# Patient Record
Sex: Female | Born: 1990 | Race: Black or African American | Hispanic: No | Marital: Single | Smoking: Former smoker
Health system: Southern US, Community
[De-identification: ages and names within clinical notes are randomized; demographics above are authoritative.]

## PROBLEM LIST (undated history)

## (undated) DIAGNOSIS — L309 Dermatitis, unspecified: Secondary | ICD-10-CM

## (undated) DIAGNOSIS — J45909 Unspecified asthma, uncomplicated: Secondary | ICD-10-CM

## (undated) DIAGNOSIS — E119 Type 2 diabetes mellitus without complications: Secondary | ICD-10-CM

## (undated) DIAGNOSIS — I10 Essential (primary) hypertension: Secondary | ICD-10-CM

## (undated) HISTORY — PX: CATARACT EXTRACTION, BILATERAL: SHX1313

---

## 2016-01-04 LAB — HM PAP SMEAR: HM Pap smear: NEGATIVE

## 2016-11-09 ENCOUNTER — Encounter: Payer: Self-pay | Admitting: Emergency Medicine

## 2016-11-09 ENCOUNTER — Emergency Department
Admission: EM | Admit: 2016-11-09 | Discharge: 2016-11-09 | Disposition: A | Discharge status: Home / Self Care | Payer: Self-pay | Attending: Emergency Medicine | Admitting: Emergency Medicine

## 2016-11-09 ENCOUNTER — Emergency Department: Payer: Self-pay

## 2016-11-09 DIAGNOSIS — J45909 Unspecified asthma, uncomplicated: Secondary | ICD-10-CM | POA: Insufficient documentation

## 2016-11-09 DIAGNOSIS — Y9301 Activity, walking, marching and hiking: Secondary | ICD-10-CM | POA: Insufficient documentation

## 2016-11-09 DIAGNOSIS — Y999 Unspecified external cause status: Secondary | ICD-10-CM | POA: Insufficient documentation

## 2016-11-09 DIAGNOSIS — S93402A Sprain of unspecified ligament of left ankle, initial encounter: Secondary | ICD-10-CM | POA: Insufficient documentation

## 2016-11-09 DIAGNOSIS — Y929 Unspecified place or not applicable: Secondary | ICD-10-CM | POA: Insufficient documentation

## 2016-11-09 DIAGNOSIS — F17200 Nicotine dependence, unspecified, uncomplicated: Secondary | ICD-10-CM | POA: Insufficient documentation

## 2016-11-09 DIAGNOSIS — W109XXA Fall (on) (from) unspecified stairs and steps, initial encounter: Secondary | ICD-10-CM | POA: Insufficient documentation

## 2016-11-09 HISTORY — DX: Unspecified asthma, uncomplicated: J45.909

## 2016-11-09 MED ORDER — KETOROLAC TROMETHAMINE 60 MG/2ML IM SOLN
60.0000 mg | Freq: Once | INTRAMUSCULAR | Status: AC
Start: 2016-11-09 — End: 2016-11-09
  Administered 2016-11-09: 60 mg via INTRAMUSCULAR
  Filled 2016-11-09: qty 2

## 2016-11-09 MED ORDER — TRAMADOL HCL 50 MG PO TABS: 50.0000 mg | ORAL_TABLET | Freq: Four times a day (QID) | ORAL | 0 refills | Status: DC | PRN

## 2016-11-09 NOTE — ED Provider Notes (Signed)
[facility] Emergency Department Provider Note   ____________________________________________   First MD Initiated Contact with Patient 11/09/16 0542     (approximate)  I have reviewed the triage vital signs and the nursing notes.   HISTORY  Chief Complaint Ankle Pain    HPI Kelsey Mcclure is a 27 y.o. female Who comes into the hospital today with a left ankle injury.the patient reports that she was going down some stairs yesterday and she slipped and fell. This occurred around 10:00. The patient did not hit her head just twisted her ankle during the injury. She reports that when she arrived home she iced her ankle and went to sleep. She got up to use the restroom today and reports that there was worsened pain. She reports that the pain is especially bad when she tries to place weight on her left ankle. She decided to come in and get checked out. The patient did not take any medicine for pain prior to coming in. At rest the patient's pain is a 7 out of 10 in intensity but when she places weight on it and 9 out of 10. She denies any numbness or tingling. She is here today for evaluation.   Past Medical History:  Diagnosis Date  . Asthma     There are no active problems to display for this patient.   History reviewed. No pertinent surgical history.  Prior to Admission medications   Medication Sig Start Date End Date Taking? Authorizing Provider  traMADol (ULTRAM) 50 MG tablet Take 1 tablet (50 mg total) by mouth every 6 (six) hours as needed. 11/09/16   Rebecka Apley, MD    Allergies Patient has no known allergies.  No family history on file.  Social History Social History  Substance Use Topics  . Smoking status: Current Every Day Smoker  . Smokeless tobacco: Not on file  . Alcohol use Yes    Review of Systems  Constitutional: No fever/chills Eyes: No visual changes. ENT: No sore throat. Cardiovascular: Denies chest  pain. Respiratory: Denies shortness of breath. Gastrointestinal: No abdominal pain.  No nausea, no vomiting.  No diarrhea.  No constipation. Genitourinary: Negative for dysuria. Musculoskeletal: left ankle pain Skin: Negative for rash. Neurological: Negative for headaches, focal weakness or numbness.   ____________________________________________   PHYSICAL EXAM:  VITAL SIGNS: ED Triage Vitals  Enc Vitals Group     BP 11/09/16 0357 138/82     Pulse Rate 11/09/16 0357 91     Resp 11/09/16 0357 18     Temp 11/09/16 0357 98 F (36.7 C)     Temp Source 11/09/16 0357 Oral     SpO2 11/09/16 0357 98 %     Weight 11/09/16 0358 280 lb (127 kg)     Height 11/09/16 0358 5\' 3"  (1.6 m)     Head Circumference --      Peak Flow --      Pain Score 11/09/16 0357 9     Pain Loc --      Pain Edu? --      Excl. in GC? --     Constitutional: Alert and oriented. Well appearing and in mild distress. Eyes: Conjunctivae are normal. PERRL. EOMI. Head: Atraumatic. Nose: No congestion/rhinnorhea. Mouth/Throat: Mucous membranes are moist.  Oropharynx non-erythematous. Cardiovascular: Normal rate, regular rhythm. Grossly normal heart sounds.  Good peripheral circulation. Respiratory: Normal respiratory effort.  No retractions. Lungs CTAB. Gastrointestinal: Soft and nontender. No distention. positive bowel sounds Musculoskeletal: No  lower extremity tenderness nor edema.  Tenderness to palpation of left ankle with some soft tissue swelling laterally. The patient also has some pain with passive range of motion. Neurologic:  Normal speech and language.  Skin:  Skin is warm, dry and intact.  Psychiatric: Mood and affect are normal.   ____________________________________________   LABS (all labs ordered are listed, but only abnormal results are displayed)  Labs Reviewed - No data to  display ____________________________________________  EKG  none ____________________________________________  RADIOLOGY  Dg Ankle Complete Left  Result Date: 11/09/2016 CLINICAL DATA:  Trip and fall injury with left ankle pain. EXAM: LEFT ANKLE COMPLETE - 3+ VIEW COMPARISON:  None. FINDINGS: Soft tissue swelling about the left ankle. No evidence of acute fracture or dislocation. No focal bone lesion or bone destruction. Bone cortex appears intact. 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 IMPRESSION: Soft tissue swelling.  No acute bony abnormalities. Electronically Signed   By: Burman NievesWilliam  Stevens M.D.   On: 11/09/2016 04:45    ____________________________________________   PROCEDURES  Procedure(s) performed: please, see procedure note(s).  .Splint Application Date/Time: 11/09/2016 6:00 AM Performed by: Rebecka ApleyWEBSTER, Bryley Kovacevic P Authorized by: Rebecka ApleyWEBSTER, Lorette Peterkin P   Consent:    Consent obtained:  Verbal Pre-procedure details:    Sensation:  Normal   Skin color:  Manson PasseyBrown Procedure details:    Laterality:  Left   Location:  Ankle   Ankle:  L ankle   Splint type:  Ankle stirrup   Supplies:  Prefabricated splint Post-procedure details:    Pain:  Unchanged   Sensation:  Normal   Skin color:  Brown   Patient tolerance of procedure:  Tolerated well, no immediate complications    Critical Care performed: No  ____________________________________________   INITIAL IMPRESSION / ASSESSMENT AND PLAN / ED COURSE  As part of my medical decision making, I reviewed the following data within the electronic MEDICAL RECORD NUMBER Notes from prior ED visits and [place] Controlled Substance Database   This is a 26 year old female who comes into the hospital today with some left ankle pain after falling down some stairs.  The differential diagnosis includes fibula fracture, tibia fracture, ligamentous injury, ankle sprain, musculoskeletal pain.  We did send the patient for an  x-ray and while she does have some soft tissue swelling she does not have any acute fracture. I did give the patient a shot of Toradol and we will place her in a splint and give her some crutches. The patient is to be made nonweightbearing until she can follow up with orthopedic surgery for further evaluation of her ankle. The patient has no further questions or concerns and she'll be discharged home.      ____________________________________________   FINAL CLINICAL IMPRESSION(S) / ED DIAGNOSES  Final diagnoses:  Sprain of left ankle, unspecified ligament, initial encounter      NEW MEDICATIONS STARTED DURING THIS VISIT:  New Prescriptions   TRAMADOL (ULTRAM) 50 MG TABLET    Take 1 tablet (50 mg total) by mouth every 6 (six) hours as needed.     Note:  This document was prepared using Dragon voice recognition software and may include unintentional dictation errors.    Rebecka ApleyWebster, Micki Cassel P, MD 11/09/16 [id number]

## 2016-11-09 NOTE — ED Triage Notes (Signed)
Pt tripped and fell and is now co left ankle pain, pt is able to ambulate with pain.

## 2016-11-09 NOTE — Discharge Instructions (Signed)
Please follow-up with orthopedics

## 2017-12-08 DIAGNOSIS — IMO0002 Reserved for concepts with insufficient information to code with codable children: Secondary | ICD-10-CM | POA: Insufficient documentation

## 2017-12-08 LAB — HM DIABETES EYE EXAM

## 2018-01-13 ENCOUNTER — Encounter: Payer: Self-pay | Admitting: Emergency Medicine

## 2018-01-13 ENCOUNTER — Other Ambulatory Visit: Payer: Self-pay

## 2018-01-13 ENCOUNTER — Emergency Department
Admission: EM | Admit: 2018-01-13 | Discharge: 2018-01-13 | Disposition: A | Discharge status: Home / Self Care | Payer: BLUE CROSS/BLUE SHIELD | Attending: Emergency Medicine | Admitting: Emergency Medicine

## 2018-01-13 DIAGNOSIS — F17200 Nicotine dependence, unspecified, uncomplicated: Secondary | ICD-10-CM | POA: Diagnosis not present

## 2018-01-13 DIAGNOSIS — H1031 Unspecified acute conjunctivitis, right eye: Secondary | ICD-10-CM | POA: Insufficient documentation

## 2018-01-13 DIAGNOSIS — R062 Wheezing: Secondary | ICD-10-CM | POA: Diagnosis not present

## 2018-01-13 DIAGNOSIS — H11431 Conjunctival hyperemia, right eye: Secondary | ICD-10-CM | POA: Diagnosis present

## 2018-01-13 MED ORDER — EYE WASH OPHTH SOLN
1.0000 [drp] | OPHTHALMIC | Status: DC | PRN
  Administered 2018-01-13: 1 [drp] via OPHTHALMIC
  Filled 2018-01-13: qty 118

## 2018-01-13 MED ORDER — FLUORESCEIN SODIUM 1 MG OP STRP
1.0000 | ORAL_STRIP | Freq: Once | OPHTHALMIC | Status: AC
  Administered 2018-01-13: 1 via OPHTHALMIC
  Filled 2018-01-13: qty 1

## 2018-01-13 MED ORDER — IPRATROPIUM-ALBUTEROL 0.5-2.5 (3) MG/3ML IN SOLN
RESPIRATORY_TRACT | Status: AC
  Filled 2018-01-13: qty 3

## 2018-01-13 MED ORDER — IPRATROPIUM-ALBUTEROL 0.5-2.5 (3) MG/3ML IN SOLN
3.0000 mL | Freq: Once | RESPIRATORY_TRACT | Status: AC
  Administered 2018-01-13: 3 mL via RESPIRATORY_TRACT
  Filled 2018-01-13: qty 3

## 2018-01-13 MED ORDER — MOXIFLOXACIN HCL 0.5 % OP SOLN: 1.0000 [drp] | Freq: Three times a day (TID) | OPHTHALMIC | 0 refills | Status: AC

## 2018-01-13 MED ORDER — TETRACAINE HCL 0.5 % OP SOLN
1.0000 [drp] | Freq: Once | OPHTHALMIC | Status: AC
  Administered 2018-01-13: 1 [drp] via OPHTHALMIC
  Filled 2018-01-13: qty 4

## 2018-01-13 NOTE — ED Triage Notes (Signed)
R eye irritation and blurred vision since yesterday. Sclera noted reddened. States does not know of getting anything in it.

## 2018-01-13 NOTE — ED Provider Notes (Signed)
[facility] Emergency Department Provider Note  ____________________________________________   First MD Initiated Contact with Patient 01/13/18 1651     (approximate)  I have reviewed the triage vital signs and the nursing notes.   HISTORY  Chief Complaint Eye Problem   HPI Kelsey Mcclure is a 27 y.o. female presents today with complaint of  right eye redness.  Patient states that she is not aware of any injury and denies drainage from her eye.  She states that she was not aware that her eye was red until several people at work commented about it.  She states that her vision is "off" due to cataracts.  She is scheduled for surgery on January 6 for her left eye.  Patient denies any pain in her right eye.  She has no symptoms of nausea, vomiting or headache.  She denies any foreign body sensation.   Past Medical History:  Diagnosis Date  . Asthma     There are no active problems to display for this patient.   History reviewed. No pertinent surgical history.  Prior to Admission medications   Medication Sig Start Date End Date Taking? Authorizing Provider  moxifloxacin (VIGAMOX) 0.5 % ophthalmic solution Place 1 drop into the right eye 3 (three) times daily for 7 days. 01/13/18 01/20/18  Tommi RumpsSummers, Rhonda L, PA-C  traMADol (ULTRAM) 50 MG tablet Take 1 tablet (50 mg total) by mouth every 6 (six) hours as needed. 11/09/16   Rebecka ApleyWebster, Allison P, MD    Allergies Patient has no known allergies.  No family history on file.  Social History Social History   Tobacco Use  . Smoking status: Current Every Day Smoker  Substance Use Topics  . Alcohol use: Yes  . Drug use: Not on file    Review of Systems Constitutional: No fever/chills Eyes: Bilateral cataracts.  Right eye edema. ENT: No sore throat. Cardiovascular: Denies chest pain. Respiratory: Denies shortness of breath. Musculoskeletal: Negative for back pain. Skin: Negative for  rash. Neurological: Negative for headaches, focal weakness or numbness. ____________________________________________   PHYSICAL EXAM:  VITAL SIGNS: ED Triage Vitals  Enc Vitals Group     BP 01/13/18 1640 (!) 148/92     Pulse Rate 01/13/18 1640 92     Resp 01/13/18 1640 20     Temp 01/13/18 1640 98 F (36.7 C)     Temp Source 01/13/18 1640 Oral     SpO2 01/13/18 1640 97 %     Weight 01/13/18 1641 295 lb (133.8 kg)     Height 01/13/18 1641 5\' 3"  (1.6 m)     Head Circumference --      Peak Flow --      Pain Score 01/13/18 1641 0     Pain Loc --      Pain Edu? --      Excl. in GC? --    Constitutional: Alert and oriented. Well appearing and in no acute distress. Eyes: Right conjunctive a is injected.  There was yellow thick exudate noted on initial exam that was removed with a Q-tip.  Lid was everted and no foreign body was noted.  PERRL. EOMI. no pain with EOMs.  Fluorescein dye was placed and no corneal abrasion was noted.  Tono-Pen in the left eye was 17/19.  Tono-Pen right eye was 21/19. Head: Atraumatic. Nose: No congestion/rhinnorhea. Neck: No stridor.   Hematological/Lymphatic/Immunilogical: No cervical lymphadenopathy. Cardiovascular: Normal rate, regular rhythm. Grossly normal heart sounds.  Good peripheral circulation. Respiratory: Normal  respiratory effort.  No retractions. Lungs bilateral expiratory wheezing.  Patient is able to talk in complete sentences without any respiratory difficulty.  Has a history of asthma. Gastrointestinal: Soft and nontender. No distention.  Musculoskeletal: Moves upper and lower extremities with any difficulty.  Normal gait was noted. Neurologic:  Normal speech and language. No gross focal neurologic deficits are appreciated. No gait instability. Skin:  Skin is warm, dry and intact. No rash noted. Psychiatric: Mood and affect are normal. Speech and behavior are normal.  ____________________________________________   LABS (all labs  ordered are listed, but only abnormal results are displayed)  Labs Reviewed - No data to display   PROCEDURES  Procedure(s) performed: None  Procedures  Critical Care performed: No  ____________________________________________   INITIAL IMPRESSION / ASSESSMENT AND PLAN / ED COURSE  As part of my medical decision making, I reviewed the following data within the electronic MEDICAL RECORD NUMBER Notes from prior ED visits and [place] Controlled Substance Database  Patient presents to the ED with complaint of right eye redness that was pointed out to her while she was at work.  Patient states that there was no eye injury and she was not aware that her eye looked red.  She denies any photophobia or pain.  There is been no nausea, vomiting or headache.  Patient states that she currently has bilateral cataracts and is scheduled for surgery at [facility] on January 6 for her left eye.  She states her vision is very poor in both eyes even on good days.  [facility] to have a prescription for Vigamox at the pharmacy but patient states that it is not there.  A prescription for Vigamox was given to the patient to begin using in her right eye.  She is given strict handwashing precautions as there was yellow exudate removed from the corner of her eye while in the ED.  Tono-Pen readings were unremarkable and not concerning for acute glaucoma.  She was also given a note to remain out of work tomorrow.  She is to call her ophthalmologist Dr. Noel Geroldohen on Monday.  She is to return to the emergency department if any severe worsening of her symptoms or urgent concerns.  ____________________________________________   FINAL CLINICAL IMPRESSION(S) / ED DIAGNOSES  Final diagnoses:  Acute bacterial conjunctivitis of right eye     ED Discharge Orders         Ordered    moxifloxacin (VIGAMOX) 0.5 % ophthalmic solution  3 times daily     01/13/18 1843           Note:  This document was prepared using  Dragon voice recognition software and may include unintentional dictation errors.    Tommi RumpsSummers, Rhonda L, PA-C 01/13/18 1912    Jeanmarie PlantMcShane, James A, MD 01/13/18 [phone]

## 2018-01-13 NOTE — Discharge Instructions (Addendum)
[facility] Monday if any continued problems.  Return to the emergency department if any worsening of your symptoms or follow[facility].  Begin using Vigamox ophthalmic solution to your right eye.  Wash your hands immediately after putting the eyedrops in your eye.

## 2018-03-09 LAB — HM DIABETES EYE EXAM

## 2018-09-27 ENCOUNTER — Ambulatory Visit: Payer: BLUE CROSS/BLUE SHIELD | Admitting: Physician Assistant

## 2018-09-27 NOTE — Progress Notes (Deleted)
Patient: Kelsey Mcclure, Female    DOB: 23-Sep-1990, 28 y.o.   MRN: [id number] Visit Date: 09/27/2018  Today's Provider: Trey SailorsAdriana M Pollak, PA-C   No chief complaint on file.  Subjective:     Annual physical exam Kelsey Mcclure is a 28 y.o. female who presents today to establish care.  -----------------------------------------------------------------   Review of Systems  Constitutional: Negative.   HENT: Negative.   Eyes: Negative.   Respiratory: Negative.   Cardiovascular: Negative.   Gastrointestinal: Negative.   Endocrine: Negative.   Genitourinary: Negative.   Musculoskeletal: Negative.   Skin: Negative.   Allergic/Immunologic: Negative.   Neurological: Negative.   Hematological: Negative.   Psychiatric/Behavioral: Negative.     Social History      She  reports that she has been smoking. She does not have any smokeless tobacco history on file. She reports current alcohol use.       Social History   Socioeconomic History  . Marital status: Single    Spouse name: Not on file  . Number of children: Not on file  . Years of education: Not on file  . Highest education level: Not on file  Occupational History  . Not on file  Social Needs  . Financial resource strain: Not on file  . Food insecurity    Worry: Not on file    Inability: Not on file  . Transportation needs    Medical: Not on file    Non-medical: Not on file  Tobacco Use  . Smoking status: Current Every Day Smoker  Substance and Sexual Activity  . Alcohol use: Yes  . Drug use: Not on file  . Sexual activity: Not on file  Lifestyle  . Physical activity    Days per week: Not on file    Minutes per session: Not on file  . Stress: Not on file  Relationships  . Social Musicianconnections    Talks on phone: Not on file    Gets together: Not on file    Attends religious service: Not on file    Active member of club or organization: Not on file    Attends meetings of clubs or organizations:  Not on file    Relationship status: Not on file  Other Topics Concern  . Not on file  Social History Narrative  . Not on file    Past Medical History:  Diagnosis Date  . Asthma      There are no active problems to display for this patient.   No past surgical history on file.  Family History        No family status information on file.        Her family history is not on file.      No Known Allergies   Current Outpatient Medications:  .  traMADol (ULTRAM) 50 MG tablet, Take 1 tablet (50 mg total) by mouth every 6 (six) hours as needed., Disp: 12 tablet, Rfl: 0   Patient Care Team: Patient, No Pcp Per as PCP - General (General Practice)    Objective:    Vitals: There were no vitals taken for this visit.  There were no vitals filed for this visit.   Physical Exam   Depression Screen No flowsheet data found.     Assessment & Plan:     Routine Health Maintenance and Physical Exam  Exercise Activities and Dietary recommendations Goals   None      There is no immunization  history on file for this patient.  There are no preventive care reminders to display for this patient.   Discussed health benefits of physical activity, and encouraged her to engage in regular exercise appropriate for her age and condition.    --------------------------------------------------------------------    Trinna Post, PA-C  [place]

## 2018-10-22 ENCOUNTER — Other Ambulatory Visit: Payer: Self-pay

## 2018-10-22 ENCOUNTER — Ambulatory Visit (INDEPENDENT_AMBULATORY_CARE_PROVIDER_SITE_OTHER): Payer: BLUE CROSS/BLUE SHIELD | Admitting: Physician Assistant

## 2018-10-22 ENCOUNTER — Encounter: Payer: Self-pay | Admitting: Physician Assistant

## 2018-10-22 VITALS — BP 145/104 | HR 84 | Temp 96.8°F | Ht 63.0 in | Wt 328.0 lb

## 2018-10-22 DIAGNOSIS — Z23 Encounter for immunization: Secondary | ICD-10-CM

## 2018-10-22 DIAGNOSIS — F32A Depression, unspecified: Secondary | ICD-10-CM

## 2018-10-22 DIAGNOSIS — F329 Major depressive disorder, single episode, unspecified: Secondary | ICD-10-CM

## 2018-10-22 DIAGNOSIS — L309 Dermatitis, unspecified: Secondary | ICD-10-CM

## 2018-10-22 DIAGNOSIS — E1165 Type 2 diabetes mellitus with hyperglycemia: Secondary | ICD-10-CM | POA: Diagnosis not present

## 2018-10-22 DIAGNOSIS — T7803XS Anaphylactic reaction due to other fish, sequela: Secondary | ICD-10-CM

## 2018-10-22 DIAGNOSIS — D619 Aplastic anemia, unspecified: Secondary | ICD-10-CM

## 2018-10-22 DIAGNOSIS — Z114 Encounter for screening for human immunodeficiency virus [HIV]: Secondary | ICD-10-CM

## 2018-10-22 DIAGNOSIS — R29818 Other symptoms and signs involving the nervous system: Secondary | ICD-10-CM

## 2018-10-22 DIAGNOSIS — J45909 Unspecified asthma, uncomplicated: Secondary | ICD-10-CM

## 2018-10-22 DIAGNOSIS — R030 Elevated blood-pressure reading, without diagnosis of hypertension: Secondary | ICD-10-CM

## 2018-10-22 DIAGNOSIS — D649 Anemia, unspecified: Secondary | ICD-10-CM

## 2018-10-22 LAB — POCT GLYCOSYLATED HEMOGLOBIN (HGB A1C): Hemoglobin A1C: 7.2 % — AB (ref 4.0–5.6)

## 2018-10-22 MED ORDER — ALBUTEROL SULFATE HFA 108 (90 BASE) MCG/ACT IN AERS: 2.0000 | INHALATION_SPRAY | Freq: Four times a day (QID) | RESPIRATORY_TRACT | 5 refills | Status: DC | PRN

## 2018-10-22 NOTE — Patient Instructions (Signed)
Diabetes Mellitus and Exercise Exercising regularly is important for your overall health, especially when you have diabetes (diabetes mellitus). Exercising is not only about losing weight. It has many other health benefits, such as increasing muscle strength and bone density and reducing body fat and stress. This leads to improved fitness, flexibility, and endurance, all of which result in better overall health. Exercise has additional benefits for people with diabetes, including:  Reducing appetite.  Helping to lower and control blood glucose.  Lowering blood pressure.  Helping to control amounts of fatty substances (lipids) in the blood, such as cholesterol and triglycerides.  Helping the body to respond better to insulin (improving insulin sensitivity).  Reducing how much insulin the body needs.  Decreasing the risk for heart disease by: ? Lowering cholesterol and triglyceride levels. ? Increasing the levels of good cholesterol. ? Lowering blood glucose levels. What is my activity plan? Your health care provider or certified diabetes educator can help you make a plan for the type and frequency of exercise (activity plan) that works for you. Make sure that you:  Do at least 150 minutes of moderate-intensity or vigorous-intensity exercise each week. This could be brisk walking, biking, or water aerobics. ? Do stretching and strength exercises, such as yoga or weightlifting, at least 2 times a week. ? Spread out your activity over at least 3 days of the week.  Get some form of physical activity every day. ? Do not go more than 2 days in a row without some kind of physical activity. ? Avoid being inactive for more than 30 minutes at a time. Take frequent breaks to walk or stretch.  Choose a type of exercise or activity that you enjoy, and set realistic goals.  Start slowly, and gradually increase the intensity of your exercise over time. What do I need to know about managing my  diabetes?   Check your blood glucose before and after exercising. ? If your blood glucose is 240 mg/dL (13.3 mmol/L) or higher before you exercise, check your urine for ketones. If you have ketones in your urine, do not exercise until your blood glucose returns to normal. ? If your blood glucose is 100 mg/dL (5.6 mmol/L) or lower, eat a snack containing 15-20 grams of carbohydrate. Check your blood glucose 15 minutes after the snack to make sure that your level is above 100 mg/dL (5.6 mmol/L) before you start your exercise.  Know the symptoms of low blood glucose (hypoglycemia) and how to treat it. Your risk for hypoglycemia increases during and after exercise. Common symptoms of hypoglycemia can include: ? Hunger. ? Anxiety. ? Sweating and feeling clammy. ? Confusion. ? Dizziness or feeling light-headed. ? Increased heart rate or palpitations. ? Blurry vision. ? Tingling or numbness around the mouth, lips, or tongue. ? Tremors or shakes. ? Irritability.  Keep a rapid-acting carbohydrate snack available before, during, and after exercise to help prevent or treat hypoglycemia.  Avoid injecting insulin into areas of the body that are going to be exercised. For example, avoid injecting insulin into: ? The arms, when playing tennis. ? The legs, when jogging.  Keep records of your exercise habits. Doing this can help you and your health care provider adjust your diabetes management plan as needed. Write down: ? Food that you eat before and after you exercise. ? Blood glucose levels before and after you exercise. ? The type and amount of exercise you have done. ? When your insulin is expected to peak, if you use   insulin. Avoid exercising at times when your insulin is peaking.  When you start a new exercise or activity, work with your health care provider to make sure the activity is safe for you, and to adjust your insulin, medicines, or food intake as needed.  Drink plenty of water while  you exercise to prevent dehydration or heat stroke. Drink enough fluid to keep your urine clear or pale yellow. Summary  Exercising regularly is important for your overall health, especially when you have diabetes (diabetes mellitus).  Exercising has many health benefits, such as increasing muscle strength and bone density and reducing body fat and stress.  Your health care provider or certified diabetes educator can help you make a plan for the type and frequency of exercise (activity plan) that works for you.  When you start a new exercise or activity, work with your health care provider to make sure the activity is safe for you, and to adjust your insulin, medicines, or food intake as needed. This information is not intended to replace advice given to you by your health care provider. Make sure you discuss any questions you have with your health care provider. Document Released: 04/02/2003 Document Revised: 08/04/2016 Document Reviewed: 06/22/2015 Elsevier Patient Education  2020 [employer]  

## 2018-10-22 NOTE — Progress Notes (Signed)
Patient: Kelsey Mcclure Female    DOB: 1990/03/14   28 y.o.   MRN: [id number] Visit Date: 10/26/2018  Today's Provider: Trey SailorsAdriana M Pollak, PA-C   Chief Complaint  Patient presents with  . Establish Care  . Asthma    Under good control; needs refills   Subjective:     HPI   Was previously seen at Urgent care until it closed. Living in [place], [place]. Works at [employer] in [place]. Living alone. Children: none. Influenza shot: Diagnosed with diabetes a few years ago when she was in the hospital for asthma attack.   Moved from [place] about two years ago  History of Asthma: well controlled needs refills. Taking symbicort daily and albuterol PRN.   DM II: Diagnosed several years ago when in the hospital. Previously on Victoza and Metformin. Not currently taking these medications because her A1c had improved. She was controlling this with diet. Last eye exam. Two weeks. Did not check sugars because she does not have a meter. Has cut out soda. Has been eating more poorly. Has been switching white bread to wheat bread, and white rice to brown rice.   Needs meter.  Depression: Has previously been treated for depression but does not desire any currently.  Many of her questions relate to sleep.   Elevated BP: Reports she has never been treated for HTN.  BP Readings from Last 3 Encounters:  10/22/18 (!) 145/104  01/13/18 117/69  11/09/16 125/90    PAP: Due. PHQ9 SCORE ONLY 10/22/2018  Score 12   Results of the Epworth flowsheet 10/23/2018  Sitting and reading 3  Watching TV 2  Sitting, inactive in a public place (e.g. a theatre or a meeting) 1  As a passenger in a car for an hour without a break 3  Lying down to rest in the afternoon when circumstances permit 3  Sitting and talking to someone 1  Sitting quietly after a lunch without alcohol 2  In a car, while stopped for a few minutes in traffic 0  Total score 15     Allergies  Allergen Reactions  . Bee Venom  Anaphylaxis  . Peanut Oil Anaphylaxis  . Shellfish Allergy Hives     Current Outpatient Medications:  .  albuterol (VENTOLIN HFA) 108 (90 Base) MCG/ACT inhaler, Inhale 2 puffs into the lungs every 6 (six) hours as needed for wheezing or shortness of breath., Disp: 8 g, Rfl: 5 .  budesonide-formoterol (SYMBICORT) 160-4.5 MCG/ACT inhaler, , Disp: , Rfl:  .  EPINEPHrine (EPIPEN 2-PAK) 0.3 mg/0.3 mL IJ SOAJ injection, Inject 0.3 mg into the muscle as needed for anaphylaxis., Disp: , Rfl:  .  halobetasol (ULTRAVATE) 0.05 % ointment, APPLY OINTMENT TOPICALLY TO AFFECTED AREA TWICE DAILY AS NEEDED FOR RASH, Disp: , Rfl:  .  montelukast (SINGULAIR) 10 MG tablet, Take 10 mg by mouth at bedtime., Disp: , Rfl:  .  traMADol (ULTRAM) 50 MG tablet, Take 1 tablet (50 mg total) by mouth every 6 (six) hours as needed., Disp: 12 tablet, Rfl: 0  Review of Systems  Constitutional: Negative.   HENT: Negative.   Eyes: Negative.   Respiratory: Negative.   Cardiovascular: Negative.   Gastrointestinal: Negative.   Endocrine: Negative.   Genitourinary: Negative.   Musculoskeletal: Negative.   Skin: Positive for rash. Negative for color change, pallor and wound.  Allergic/Immunologic: Negative.   Neurological: Negative.   Hematological: Negative.   Psychiatric/Behavioral: Negative.     Social History  Tobacco Use  . Smoking status: Current Every Day Smoker    Packs/day: 0.50    Types: Cigarettes  . Smokeless tobacco: Never Used  Substance Use Topics  . Alcohol use: Yes      Objective:   BP (!) 145/104 (BP Location: Right Arm, Patient Position: Sitting, Cuff Size: Large)   Pulse 84   Temp (!) 96.8 F (36 C) (Temporal)   Ht 5\' 3"  (1.6 m)   Wt (!) 328 lb (148.8 kg)   BMI 58.10 kg/m  Vitals:   10/22/18 1403  BP: (!) 145/104  Pulse: 84  Temp: (!) 96.8 F (36 C)  TempSrc: Temporal  Weight: (!) 328 lb (148.8 kg)  Height: 5\' 3"  (1.6 m)  Body mass index is 58.1 kg/m.   Physical Exam  Constitutional:      Appearance: Normal appearance. She is obese.  Cardiovascular:     Rate and Rhythm: Normal rate and regular rhythm.     Heart sounds: Normal heart sounds.  Pulmonary:     Effort: Pulmonary effort is normal.     Breath sounds: Normal breath sounds.  Skin:    General: Skin is warm and dry.  Neurological:     Mental Status: She is alert and oriented to person, place, and time. Mental status is at baseline.  Psychiatric:        Mood and Affect: Mood normal.        Behavior: Behavior normal.      Results for orders placed or performed in visit on 10/22/18  Comprehensive Metabolic Panel (CMET)  Result Value Ref Range   Glucose 111 (H) 65 - 99 mg/dL   BUN 10 6 - 20 mg/dL   Creatinine, Ser 0.57 - 1.00 mg/dL   GFR calc non Af Amer 104 >59 mL/min/1.73   GFR calc Af Amer 120 >59 mL/min/1.73   BUN/Creatinine Ratio 13 9 - 23   Sodium 138 134 - 144 mmol/L   Potassium 4.3 3.5 - 5.2 mmol/L   Chloride 100 96 - 106 mmol/L   CO2 28 20 - 29 mmol/L   Calcium 9.6 8.7 - 10.2 mg/dL   Total Protein 7.7 6.0 - 8.5 g/dL   Albumin 4.2 3.9 - 5.0 g/dL   Globulin, Total 3.5 1.5 - 4.5 g/dL   Albumin/Globulin Ratio 1.2 1.2 - 2.2   Bilirubin Total <0.2 0.0 - 1.2 mg/dL   Alkaline Phosphatase 99 39 - 117 IU/L   AST 20 0 - 40 IU/L   ALT 16 0 - 32 IU/L  CBC with Differential  Result Value Ref Range   WBC 9.2 3.4 - 10.8 x10E3/uL   RBC 5.27 3.77 - 5.28 x10E6/uL   Hemoglobin 12.8 11.1 - 15.9 g/dL   Hematocrit 10/24/18 2.03 - 46.6 %   MCV 77 (L) 79 - 97 fL   MCH 24.3 (L) 26.6 - 33.0 pg   MCHC 31.4 (L) 31.5 - 35.7 g/dL   RDW 55.9 74.1 - 63.8 %   Platelets 363 150 - 450 x10E3/uL   Neutrophils 57 Not Estab. %   Lymphs 28 Not Estab. %   Monocytes 6 Not Estab. %   Eos 8 Not Estab. %   Basos 1 Not Estab. %   Neutrophils Absolute 5.3 1.4 - 7.0 x10E3/uL   Lymphocytes Absolute 2.6 0.7 - 3.1 x10E3/uL   Monocytes Absolute 0.6 0.1 - 0.9 x10E3/uL   EOS (ABSOLUTE) 0.7 (H) 0.0 - 0.4 x10E3/uL    Basophils Absolute 0.1 0.0 -  0.2 x10E3/uL   Immature Granulocytes 0 Not Estab. %   Immature Grans (Abs) 0.0 0.0 - 0.1 x10E3/uL  TSH  Result Value Ref Range   TSH 3.460 0.450 - 4.500 uIU/mL  Lipid Profile  Result Value Ref Range   Cholesterol, Total 195 100 - 199 mg/dL   Triglycerides 106 0 - 149 mg/dL   HDL 46 >39 mg/dL   VLDL Cholesterol Cal 19 5 - 40 mg/dL   LDL Chol Calc (NIH) 130 (H) 0 - 99 mg/dL   Chol/HDL Ratio 4.2 0.0 - 4.4 ratio  HIV antibody (with reflex)  Result Value Ref Range   HIV Screen 4th Generation wRfx Non Reactive Non Reactive  Urine Microalbumin w/creat. ratio  Result Value Ref Range   Creatinine, Urine 165.2 Not Estab. mg/dL   Microalbumin, Urine 3.7 Not Estab. ug/mL   Microalb/Creat Ratio 2 0 - 29 mg/g creat  Iron and TIBC  Result Value Ref Range   Total Iron Binding Capacity 368 250 - 450 ug/dL   UIBC 318 131 - 425 ug/dL   Iron 50 27 - 159 ug/dL   Iron Saturation 14 (L) 15 - 55 %  Ferritin  Result Value Ref Range   Ferritin 63 15 - 150 ng/mL  Specimen status report  Result Value Ref Range   specimen status report Comment   POCT glycosylated hemoglobin (Hb A1C)  Result Value Ref Range   Hemoglobin A1C 7.2 (A) 4.0 - 5.6 %       Assessment & Plan    1. Type 2 diabetes mellitus with hyperglycemia, unspecified whether long term insulin use ([place])   She reports eye exam with [facility].  - Comprehensive Metabolic Panel (CMET) - CBC with Differential - Lipid Profile - Urine Microalbumin w/creat. ratio - POCT glycosylated hemoglobin (Hb A1C) - Pneumococcal polysaccharide vaccine 23-valent greater than or equal to 2yo subcutaneous/IM  2. Encounter for screening for HIV  - HIV antibody (with reflex)  3. Morbid obesity (HCC)  - TSH  4. Depression, unspecified depression type  - Ambulatory referral to Chronic Care Management Services  5. Asthma, unspecified asthma severity, unspecified whether complicated, unspecified whether persistent  -  albuterol (VENTOLIN HFA) 108 (90 Base) MCG/ACT inhaler; Inhale 2 puffs into the lungs every 6 (six) hours as needed for wheezing or shortness of breath.  Dispense: 8 g; Refill: 5  6. Elevated BP without diagnosis of hypertension  Elevated, likely represents HTN in context of metabolic disorder. Check next time. F/u 1 month CPE with PAP and BP recheck.   7. Suspected sleep apnea  Home sleep study   The entirety of the information documented in the History of Present Illness, Review of Systems and Physical Exam were personally obtained by me. Portions of this information were initially documented by Ashley Royalty, CMA and reviewed by me for thoroughness and accuracy.       Trinna Post, PA-C  [facility]

## 2018-10-23 LAB — LIPID PANEL
Chol/HDL Ratio: 4.2 ratio (ref 0.0–4.4)
Cholesterol, Total: 195 mg/dL (ref 100–199)
HDL: 46 mg/dL (ref >39)
LDL Chol Calc (NIH): 130 mg/dL — ABNORMAL HIGH (ref 0–99)
Triglycerides: 106 mg/dL (ref 0–149)
VLDL Cholesterol Cal: 19 mg/dL (ref 5–40)

## 2018-10-23 LAB — TSH: TSH: 3.46 u[IU]/mL (ref 0.450–4.500)

## 2018-10-23 LAB — COMPREHENSIVE METABOLIC PANEL
ALT: 16 IU/L (ref 0–32)
AST: 20 IU/L (ref 0–40)
Albumin/Globulin Ratio: 1.2 (ref 1.2–2.2)
Albumin: 4.2 g/dL (ref 3.9–5.0)
Alkaline Phosphatase: 99 IU/L (ref 39–117)
BUN/Creatinine Ratio: 13 (ref 9–23)
BUN: 10 mg/dL (ref 6–20)
Bilirubin Total: <0.2 mg/dL (ref 0.0–1.2)
CO2: 28 mmol/L (ref 20–29)
Calcium: 9.6 mg/dL (ref 8.7–10.2)
Chloride: 100 mmol/L (ref 96–106)
Creatinine, Ser: 0.77 mg/dL (ref 0.57–1.00)
GFR calc Af Amer: 120 mL/min/{1.73_m2} (ref >59)
GFR calc non Af Amer: 104 mL/min/{1.73_m2} (ref >59)
Globulin, Total: 3.5 g/dL (ref 1.5–4.5)
Glucose: 111 mg/dL — ABNORMAL HIGH (ref 65–99)
Potassium: 4.3 mmol/L (ref 3.5–5.2)
Sodium: 138 mmol/L (ref 134–144)
Total Protein: 7.7 g/dL (ref 6.0–8.5)

## 2018-10-23 LAB — CBC WITH DIFFERENTIAL/PLATELET
Basophils Absolute: 0.1 10*3/uL (ref 0.0–0.2)
Basos: 1 %
EOS (ABSOLUTE): 0.7 10*3/uL — ABNORMAL HIGH (ref 0.0–0.4)
Eos: 8 %
Hematocrit: 40.7 % (ref 34.0–46.6)
Hemoglobin: 12.8 g/dL (ref 11.1–15.9)
Immature Grans (Abs): 0 10*3/uL (ref 0.0–0.1)
Immature Granulocytes: 0 %
Lymphocytes Absolute: 2.6 10*3/uL (ref 0.7–3.1)
Lymphs: 28 %
MCH: 24.3 pg — ABNORMAL LOW (ref 26.6–33.0)
MCHC: 31.4 g/dL — ABNORMAL LOW (ref 31.5–35.7)
MCV: 77 fL — ABNORMAL LOW (ref 79–97)
Monocytes Absolute: 0.6 10*3/uL (ref 0.1–0.9)
Monocytes: 6 %
Neutrophils Absolute: 5.3 10*3/uL (ref 1.4–7.0)
Neutrophils: 57 %
Platelets: 363 10*3/uL (ref 150–450)
RBC: 5.27 x10E6/uL (ref 3.77–5.28)
RDW: 13.1 % (ref 11.7–15.4)
WBC: 9.2 10*3/uL (ref 3.4–10.8)

## 2018-10-23 LAB — MICROALBUMIN / CREATININE URINE RATIO
Creatinine, Urine: 165.2 mg/dL
Microalb/Creat Ratio: 2 mg/g creat (ref 0–29)
Microalbumin, Urine: 3.7 ug/mL

## 2018-10-23 LAB — HIV ANTIBODY (ROUTINE TESTING W REFLEX): HIV Screen 4th Generation wRfx: NONREACTIVE

## 2018-10-25 ENCOUNTER — Telehealth: Payer: Self-pay

## 2018-10-25 NOTE — Telephone Encounter (Signed)
Patient advised as below.  

## 2018-10-25 NOTE — Telephone Encounter (Signed)
-----   Message from Trinna Post, [place] sent at 10/25/2018 12:53 PM EDT ----- Her iron is normal.

## 2018-10-26 ENCOUNTER — Telehealth: Payer: Self-pay | Admitting: Physician Assistant

## 2018-10-26 DIAGNOSIS — J45901 Unspecified asthma with (acute) exacerbation: Secondary | ICD-10-CM | POA: Insufficient documentation

## 2018-10-26 DIAGNOSIS — R030 Elevated blood-pressure reading, without diagnosis of hypertension: Secondary | ICD-10-CM | POA: Insufficient documentation

## 2018-10-26 DIAGNOSIS — F32A Depression, unspecified: Secondary | ICD-10-CM | POA: Insufficient documentation

## 2018-10-26 DIAGNOSIS — F329 Major depressive disorder, single episode, unspecified: Secondary | ICD-10-CM | POA: Insufficient documentation

## 2018-10-26 DIAGNOSIS — J45909 Unspecified asthma, uncomplicated: Secondary | ICD-10-CM | POA: Insufficient documentation

## 2018-10-26 DIAGNOSIS — E1165 Type 2 diabetes mellitus with hyperglycemia: Secondary | ICD-10-CM | POA: Insufficient documentation

## 2018-10-26 NOTE — Chronic Care Management (AMB) (Signed)
°  Care Management   Outreach Note  10/26/2018 Name: Kelsey Mcclure MRN: [id number] DOB: 1990-02-20  Referred by: Trinna Post, PA-C Reason for referral : No chief complaint on file.   An unsuccessful telephone outreach was attempted today. The patient was referred to the case management team by for assistance with care management and care coordination.   Follow Up Plan: A HIPPA compliant phone message was left for the patient providing contact information and requesting a return call.  The care management team will reach out to the patient again over the next 7 days.  If patient returns call to provider office, please advise to call [place]  at [place]  [email or web address]   ??[phone]

## 2018-10-29 ENCOUNTER — Ambulatory Visit: Payer: Self-pay | Admitting: *Deleted

## 2018-10-29 ENCOUNTER — Encounter: Payer: Self-pay | Admitting: *Deleted

## 2018-10-29 DIAGNOSIS — F32A Depression, unspecified: Secondary | ICD-10-CM

## 2018-10-29 DIAGNOSIS — F329 Major depressive disorder, single episode, unspecified: Secondary | ICD-10-CM

## 2018-10-29 NOTE — Progress Notes (Signed)
Done

## 2018-10-30 ENCOUNTER — Encounter: Payer: Self-pay | Admitting: *Deleted

## 2018-10-30 LAB — IRON AND TIBC
Iron Saturation: 14 % — ABNORMAL LOW (ref 15–55)
Iron: 50 ug/dL (ref 27–159)
Total Iron Binding Capacity: 368 ug/dL (ref 250–450)
UIBC: 318 ug/dL (ref 131–425)

## 2018-10-30 LAB — SPECIMEN STATUS REPORT

## 2018-10-30 LAB — FERRITIN: Ferritin: 63 ng/mL (ref 15–150)

## 2018-10-30 NOTE — Patient Instructions (Signed)
Thank you allowing the Chronic Care Management Team to be a part of your care! It was a pleasure speaking with you today!  1. Please call and schedule an initial appointment with any of the following mental health therapist of choice:  [facility] [place], [place] [address] [phone]  [facility] [place] [address], [place] [phone]  [facility] [address] [place], [place] [address] [phone]   CCM (Chronic Care Management) Team   Ruben Reason PharmD  Clinical Pharmacist  [phone]   Elliot Gurney, LCSW Clinical Social Worker [phone]  Goals Addressed            This Visit's Progress   . "I need a new therapist to help me with my depression (pt-stated)       Current Barriers:  Marland Kitchen Mental Health Concerns   Clinical Social Work Clinical Goal(s):  Marland Kitchen Over the next 90 days, client will follow up with a outpatient community mental health professional* as directed by SW  Interventions: . Patient interviewed and appropriate assessments performed . Provided supportive counseling with regard to current symptoms of depression and it's affects on her daily functioning . Explored patient's current motivation to address issues related to her depression and anxiety . Discussed continued use of coping strategies including the use of a journal and reaching out to trusted friends . Ongoing mental health therapy recommended to address long history of depression and anxiety . Provided  patient with a list of mental health providers to contact to schedule an initial appointment  for long term follow up and therapy/counseling  Patient Self Care Activities:  . Performs ADL's independently . Performs IADL's independently . Tends do isolate with anxious or depressed  Initial goal documentation         The patient verbalized understanding of instructions provided  today and declined a print copy of patient instruction materials.   Telephone follow up appointment with care management team member scheduled for: 11/08/18

## 2018-10-30 NOTE — Chronic Care Management (AMB) (Signed)
Care Management    Clinical Social Work General Note  10/30/2018 Name: Kelsey Mcclure MRN: [id number] DOB: 1990/12/01  Kelsey Mcclure is a 28 y.o. year old female who is a primary care patient of Paulene Floor. The CCM was consulted to assist the patient with Mental Health Counseling and Resources.   Kelsey Mcclure was given information about  Care Management services today including:  1. CM service includes personalized support from designated clinical staff supervised by her physician, including individualized plan of care and coordination with other care providers 2. 24/7 contact phone numbers for assistance for urgent and routine care needs. 3. The patient may stop CM services at any time (effective at the end of the month) by phone call to the office staff.  Patient agreed to services and verbal consent obtained.   Review of patient status, including review of consultants reports, relevant laboratory and other test results, and collaboration with appropriate care team members and the patient's provider was performed as part of comprehensive patient evaluation and provision of chronic care management services.    SDOH (Social Determinants of Health) screening performed today. See Care Plan Entry related to challenges with: Depression   Tobacco Use Physical Activity  Advanced Directives Status: <no information> See Care Plan for related entries.   Outpatient Encounter Medications as of 10/29/2018  Medication Sig  . albuterol (VENTOLIN HFA) 108 (90 Base) MCG/ACT inhaler Inhale 2 puffs into the lungs every 6 (six) hours as needed for wheezing or shortness of breath.  . budesonide-formoterol (SYMBICORT) 160-4.5 MCG/ACT inhaler   . EPINEPHrine (EPIPEN 2-PAK) 0.3 mg/0.3 mL IJ SOAJ injection Inject 0.3 mg into the muscle as needed for anaphylaxis.  . halobetasol (ULTRAVATE) 0.05 % ointment APPLY OINTMENT TOPICALLY TO AFFECTED AREA TWICE DAILY AS NEEDED FOR RASH  . montelukast  (SINGULAIR) 10 MG tablet Take 10 mg by mouth at bedtime.  . traMADol (ULTRAM) 50 MG tablet Take 1 tablet (50 mg total) by mouth every 6 (six) hours as needed.   No facility-administered encounter medications on file as of 10/29/2018.     Goals Addressed            This Visit's Progress   . "I need a new therapist to help me with my depression (pt-stated)       Current Barriers:  Kelsey Mcclure Mental Health Concerns   Clinical Social Work Clinical Goal(s):  Kelsey Mcclure Over the next 90 days, client will follow up with a outpatient community mental health professional* as directed by SW  Interventions: . Patient interviewed and appropriate assessments performed . Provided supportive counseling with regard to current symptoms of depression and it's affects on her daily functioning . Explored patient's current motivation to address issues related to her depression and anxiety . Discussed continued use of coping strategies including the use of a journal and reaching out to trusted friends . Ongoing mental health therapy recommended to address long history of depression and anxiety . Provided  patient with a list of mental health providers to contact to schedule an initial appointment  for long term follow up and therapy/counseling  Patient Self Care Activities:  . Performs ADL's independently . Performs IADL's independently . Tends do isolate with anxious or depressed  Initial goal documentation         Follow Up Plan: Client will contact and schedule an initial appointment with a therapist from the list provided or one of her choice from personal resources        Kelsey  Mcclure, [place] Worker  [facility] Care Management [phone]

## 2018-11-02 ENCOUNTER — Encounter: Payer: Self-pay | Admitting: Physician Assistant

## 2018-11-08 ENCOUNTER — Encounter: Payer: Self-pay | Admitting: Physician Assistant

## 2018-11-08 ENCOUNTER — Ambulatory Visit: Payer: BLUE CROSS/BLUE SHIELD | Admitting: *Deleted

## 2018-11-08 ENCOUNTER — Encounter: Payer: Self-pay | Admitting: *Deleted

## 2018-11-08 DIAGNOSIS — E1165 Type 2 diabetes mellitus with hyperglycemia: Secondary | ICD-10-CM

## 2018-11-08 DIAGNOSIS — F32A Depression, unspecified: Secondary | ICD-10-CM

## 2018-11-08 DIAGNOSIS — F329 Major depressive disorder, single episode, unspecified: Secondary | ICD-10-CM

## 2018-11-08 LAB — HM DIABETES EYE EXAM

## 2018-11-08 NOTE — Progress Notes (Signed)
This encounter was created in error - please disregard.

## 2018-11-09 NOTE — Patient Instructions (Addendum)
Thank you allowing the Chronic Care Management Team to be a part of your care! It was a pleasure speaking with you today!  1. Please contact mental health therapist of choice to schedule initial appointment 2. Please call this social worker with any questions or concerns regarding your mental health follow up needs  CCM (Chronic Care Management) Team   Neldon Labella RN, BSN Nurse Care Coordinator  [phone]  Ruben Reason PharmD  Clinical Pharmacist  [phone]   [place], [place] Social Worker [phone]  Goals Addressed            This Visit's Progress   . "I need a new therapist to help me with my depression (pt-stated)       Current Barriers:  Marland Kitchen Mental Health Concerns   Clinical Social Work Clinical Goal(s):  Marland Kitchen Over the next 90 days, client will follow up with a outpatient community mental health professional* as directed by SW  Interventions: . Patient interviewed and appropriate assessments performed . Followed up on mental health resources provided to patient to ensure that initial appointment is made . Confirmed that patient did not receive mental health resources provided to her through secure e-mail . Mental health resources sent through secure e-mail again  . Ongoing mental health therapy continues to be recommended to address long history of depression and anxiety   Patient Self Care Activities:  . Performs ADL's independently . Performs IADL's independently . Tends do isolate when anxious or depressed  Please see past updates related to this goal by clicking on the "Past Updates" button in the selected goal          The patient verbalized understanding of instructions provided today and declined a print copy of patient instruction materials.   Telephone follow up appointment with care management team member scheduled for: 11/16/18

## 2018-11-09 NOTE — Chronic Care Management (AMB) (Signed)
   Care Management    Clinical Social Work Follow Up Note  11/09/2018 Name: Kelsey Mcclure MRN: [id number] DOB: February 05, 1990  Kelsey Mcclure is a 28 y.o. year old female who is a primary care patient of Paulene Floor. The CCM team was consulted for assistance with Mental Health Counseling and Resources.   Review of patient status, including review of consultants reports, other relevant assessments, and collaboration with appropriate care team members and the patient's provider was performed as part of comprehensive patient evaluation and provision of chronic care management services.     Advanced Directives Status: <no information> See Care Plan for related entries.   Outpatient Encounter Medications as of 11/08/2018  Medication Sig  . albuterol (VENTOLIN HFA) 108 (90 Base) MCG/ACT inhaler Inhale 2 puffs into the lungs every 6 (six) hours as needed for wheezing or shortness of breath.  . budesonide-formoterol (SYMBICORT) 160-4.5 MCG/ACT inhaler   . EPINEPHrine (EPIPEN 2-PAK) 0.3 mg/0.3 mL IJ SOAJ injection Inject 0.3 mg into the muscle as needed for anaphylaxis.  . halobetasol (ULTRAVATE) 0.05 % ointment APPLY OINTMENT TOPICALLY TO AFFECTED AREA TWICE DAILY AS NEEDED FOR RASH  . montelukast (SINGULAIR) 10 MG tablet Take 10 mg by mouth at bedtime.  . traMADol (ULTRAM) 50 MG tablet Take 1 tablet (50 mg total) by mouth every 6 (six) hours as needed.   No facility-administered encounter medications on file as of 11/08/2018.      Goals Addressed            This Visit's Progress   . "I need a new therapist to help me with my depression (pt-stated)       Current Barriers:  Marland Kitchen Mental Health Concerns   Clinical Social Work Clinical Goal(s):  Marland Kitchen Over the next 90 days, client will follow up with a outpatient community mental health professional* as directed by SW  Interventions: . Patient interviewed and appropriate assessments performed . Followed up on mental health  resources provided to patient to ensure that initial appointment is made . Confirmed that patient did not receive mental health resources provided to her through secure e-mail . Mental health resources sent through secure e-mail again  . Ongoing mental health therapy continues to be recommended to address long history of depression and anxiety   Patient Self Care Activities:  . Performs ADL's independently . Performs IADL's independently . Tends do isolate when anxious or depressed  Please see past updates related to this goal by clicking on the "Past Updates" button in the selected goal          Follow Up Plan: SW will follow up with patient by phone over the next 2 weeks   [place], [place] Worker  [facility] Care Management [phone]

## 2018-11-14 ENCOUNTER — Other Ambulatory Visit: Payer: Self-pay | Admitting: Physician Assistant

## 2018-11-14 DIAGNOSIS — L309 Dermatitis, unspecified: Secondary | ICD-10-CM

## 2018-11-14 MED ORDER — HALOBETASOL PROPIONATE 0.05 % EX OINT: TOPICAL_OINTMENT | CUTANEOUS | 0 refills | Status: DC

## 2018-11-14 NOTE — Telephone Encounter (Signed)
[facility] faxed refill request for the following medications:  halobetasol (ULTRAVATE) 0.05 % ointment   Please advise.

## 2018-11-16 ENCOUNTER — Telehealth: Payer: BLUE CROSS/BLUE SHIELD | Admitting: *Deleted

## 2018-11-16 ENCOUNTER — Encounter: Payer: Self-pay | Admitting: *Deleted

## 2018-11-16 ENCOUNTER — Ambulatory Visit: Payer: Self-pay | Admitting: *Deleted

## 2018-11-16 NOTE — Progress Notes (Signed)
This encounter was created in error - please disregard.

## 2018-11-16 NOTE — Chronic Care Management (AMB) (Signed)
   Care Management   Unsuccessful Call Note 11/16/2018 Name: Kelsey Mcclure MRN: [id number] DOB: 10-31-1990  Patient is a 28 year old female who sees Carles Collet, [place]  for primary care. Carles Collet, PA-C asked the CCM team to consult the patient for Mental Health Counseling and Resources.     This social worker was unable to reach patient via telephone today for follow up appointment regarding mental health resources provided. I have left HIPAA compliant voicemail asking patient to return my call. (unsuccessful outreach #1).   Plan: Will follow-up within 7 business days via telephone.      Elliot Gurney, [place] Worker  [facility] Care Management [phone]

## 2018-11-19 ENCOUNTER — Inpatient Hospital Stay (HOSPITAL_COMMUNITY): Admit: 2018-11-19 | Payer: BLUE CROSS/BLUE SHIELD

## 2018-11-19 ENCOUNTER — Encounter: Payer: Self-pay | Admitting: Physician Assistant

## 2018-11-19 ENCOUNTER — Other Ambulatory Visit: Payer: Self-pay

## 2018-11-19 ENCOUNTER — Ambulatory Visit (INDEPENDENT_AMBULATORY_CARE_PROVIDER_SITE_OTHER): Payer: BLUE CROSS/BLUE SHIELD | Admitting: Physician Assistant

## 2018-11-19 VITALS — BP 137/85 | HR 89 | Temp 97.3°F | Ht 63.0 in | Wt 329.4 lb

## 2018-11-19 DIAGNOSIS — Z0000 Encounter for general adult medical examination without abnormal findings: Secondary | ICD-10-CM | POA: Diagnosis not present

## 2018-11-19 DIAGNOSIS — Z124 Encounter for screening for malignant neoplasm of cervix: Secondary | ICD-10-CM

## 2018-11-19 NOTE — Patient Instructions (Signed)
Health Maintenance, Female Adopting a healthy lifestyle and getting preventive care are important in promoting health and wellness. Ask your health care provider about:  The right schedule for you to have regular tests and exams.  Things you can do on your own to prevent diseases and keep yourself healthy. What should I know about diet, weight, and exercise? Eat a healthy diet   Eat a diet that includes plenty of vegetables, fruits, low-fat dairy products, and lean protein.  Do not eat a lot of foods that are high in solid fats, added sugars, or sodium. Maintain a healthy weight Body mass index (BMI) is used to identify weight problems. It estimates body fat based on height and weight. Your health care provider can help determine your BMI and help you achieve or maintain a healthy weight. Get regular exercise Get regular exercise. This is one of the most important things you can do for your health. Most adults should:  Exercise for at least 150 minutes each week. The exercise should increase your heart rate and make you sweat (moderate-intensity exercise).  Do strengthening exercises at least twice a week. This is in addition to the moderate-intensity exercise.  Spend less time sitting. Even light physical activity can be beneficial. Watch cholesterol and blood lipids Have your blood tested for lipids and cholesterol at 30 years of age, then have this test every 5 years. Have your cholesterol levels checked more often if:  Your lipid or cholesterol levels are high.  You are older than 30 years of age.  You are at high risk for heart disease. What should I know about cancer screening? Depending on your health history and family history, you may need to have cancer screening at various ages. This may include screening for:  Breast cancer.  Cervical cancer.  Colorectal cancer.  Skin cancer.  Lung cancer. What should I know about heart disease, diabetes, and high blood  pressure? Blood pressure and heart disease  High blood pressure causes heart disease and increases the risk of stroke. This is more likely to develop in people who have high blood pressure readings, are of African descent, or are overweight.  Have your blood pressure checked: ? Every 3-5 years if you are 18-39 years of age. ? Every year if you are 40 years old or older. Diabetes Have regular diabetes screenings. This checks your fasting blood sugar level. Have the screening done:  Once every three years after age 40 if you are at a normal weight and have a low risk for diabetes.  More often and at a younger age if you are overweight or have a high risk for diabetes. What should I know about preventing infection? Hepatitis B If you have a higher risk for hepatitis B, you should be screened for this virus. Talk with your health care provider to find out if you are at risk for hepatitis B infection. Hepatitis C Testing is recommended for:  Everyone born from 1945 through 1965.  Anyone with known risk factors for hepatitis C. Sexually transmitted infections (STIs)  Get screened for STIs, including gonorrhea and chlamydia, if: ? You are sexually active and are younger than 30 years of age. ? You are older than 30 years of age and your health care provider tells you that you are at risk for this type of infection. ? Your sexual activity has changed since you were last screened, and you are at increased risk for chlamydia or gonorrhea. Ask your health care provider if   you are at risk.  Ask your health care provider about whether you are at high risk for HIV. Your health care provider may recommend a prescription medicine to help prevent HIV infection. If you choose to take medicine to prevent HIV, you should first get tested for HIV. You should then be tested every 3 months for as long as you are taking the medicine. Pregnancy  If you are about to stop having your period (premenopausal) and  you may become pregnant, seek counseling before you get pregnant.  Take 400 to 800 micrograms (mcg) of folic acid every day if you become pregnant.  Ask for birth control (contraception) if you want to prevent pregnancy. Osteoporosis and menopause Osteoporosis is a disease in which the bones lose minerals and strength with aging. This can result in bone fractures. If you are 65 years old or older, or if you are at risk for osteoporosis and fractures, ask your health care provider if you should:  Be screened for bone loss.  Take a calcium or vitamin D supplement to lower your risk of fractures.  Be given hormone replacement therapy (HRT) to treat symptoms of menopause. Follow these instructions at home: Lifestyle  Do not use any products that contain nicotine or tobacco, such as cigarettes, e-cigarettes, and chewing tobacco. If you need help quitting, ask your health care provider.  Do not use street drugs.  Do not share needles.  Ask your health care provider for help if you need support or information about quitting drugs. Alcohol use  Do not drink alcohol if: ? Your health care provider tells you not to drink. ? You are pregnant, may be pregnant, or are planning to become pregnant.  If you drink alcohol: ? Limit how much you use to 0-1 drink a day. ? Limit intake if you are breastfeeding.  Be aware of how much alcohol is in your drink. In the [place] one drink equals one 12 oz bottle of beer (355 mL), one 5 oz glass of wine (148 mL), or one 1 oz glass of hard liquor (44 mL). General instructions  Schedule regular health, dental, and eye exams.  Stay current with your vaccines.  Tell your health care provider if: ? You often feel depressed. ? You have ever been abused or do not feel safe at home. Summary  Adopting a healthy lifestyle and getting preventive care are important in promoting health and wellness.  Follow your health care provider's instructions about healthy  diet, exercising, and getting tested or screened for diseases.  Follow your health care provider's instructions on monitoring your cholesterol and blood pressure. This information is not intended to replace advice given to you by your health care provider. Make sure you discuss any questions you have with your health care provider. Document Released: 07/26/2010 Document Revised: 01/03/2018 Document Reviewed: 01/03/2018 Elsevier Patient Education  2020 Elsevier Inc.  

## 2018-11-19 NOTE — Progress Notes (Signed)
Patient: Kelsey Mcclure, Female    DOB: 12/09/1990, 28 y.o.   MRN: [id number] Visit Date: 11/19/2018  Today's Provider: Trinna Post, PA-C   Chief Complaint  Patient presents with  . Annual Exam   Subjective:    Annual physical exam Kelsey Mcclure is a 28 y.o. female who presents today for health maintenance and complete physical. She feels well. She reports exercising none. She reports she is sleeping poorly.  Last pap:12/31/2015 Tetanus: not UTD  Has had mammogram previously for mass determined to be benign cyst.   Reports she stopped eating fast foods and is trying to cook more. She is also trying to save money by cooking at home. Reports she is trying to improve her diet .  Caloric Drinks: Reports she drinks water. Does not drink soda. Used to drink orange soda but hasn't drank in 3 weeks. Occasional juice but mostly water. Occasionally lemonade. Reports she has a "slushy habit."   Wt Readings from Last 3 Encounters:  11/19/18 (!) 329 lb 6.4 oz (149.4 kg)  10/22/18 (!) 328 lb (148.8 kg)  01/13/18 295 lb (133.8 kg)    -----------------------------------------------------------------   Review of Systems  Constitutional: Negative.   HENT: Negative.   Eyes: Positive for photophobia and discharge.  Respiratory: Negative.   Cardiovascular: Negative.   Gastrointestinal: Negative.   Endocrine: Positive for polydipsia.  Genitourinary: Negative.   Musculoskeletal: Negative.   Skin: Negative.   Allergic/Immunologic: Negative.   Neurological: Negative.   Hematological: Negative.   Psychiatric/Behavioral: Negative.     Social History She  reports that she has been smoking cigarettes. She has been smoking about 0.50 packs per day. She has never used smokeless tobacco. She reports current alcohol use. She reports current drug use. Drug: Marijuana. Social History   Socioeconomic History  . Marital status: Single    Spouse name: Not on file  . Number of  children: Not on file  . Years of education: Not on file  . Highest education level: Not on file  Occupational History  . Not on file  Social Needs  . Financial resource strain: Not on file  . Food insecurity    Worry: Not on file    Inability: Not on file  . Transportation needs    Medical: Not on file    Non-medical: Not on file  Tobacco Use  . Smoking status: Current Every Day Smoker    Packs/day: 0.50    Types: Cigarettes  . Smokeless tobacco: Never Used  Substance and Sexual Activity  . Alcohol use: Yes  . Drug use: Yes    Types: Marijuana  . Sexual activity: Not Currently  Lifestyle  . Physical activity    Days per week: 0 days    Minutes per session: 0 min  . Stress: Only a little  Relationships  . Social connections    Talks on phone: More than three times a week    Gets together: More than three times a week    Attends religious service: Never    Active member of club or organization: No    Attends meetings of clubs or organizations: Never    Relationship status: Never married  Other Topics Concern  . Not on file  Social History Narrative  . Not on file    Patient Active Problem List   Diagnosis Date Noted  . Type 2 diabetes mellitus with hyperglycemia ([place]) 10/26/2018  . Morbid obesity ([place]) 10/26/2018  . Depression 10/26/2018  .  Asthma 10/26/2018  . Elevated BP without diagnosis of hypertension 10/26/2018  . Posterior subcapsular polar cataract, nonsenile 12/08/2017    Past Surgical History:  Procedure Laterality Date  . CATARACT EXTRACTION, BILATERAL      Family History  Family Status  Relation Name Status  . Mother  Alive  . Father  Alive  . Sister  Alive  . Brother  Alive  . Sister  Alive   Her family history includes Asthma in her sister; Diabetes in her father; Eczema in her father, mother, sister, and sister; Psoriasis in her father; Sleep apnea in her father.     Allergies  Allergen Reactions  . Bee Venom Anaphylaxis  . Peanut  Oil Anaphylaxis  . Shellfish Allergy Hives    Previous Medications   ALBUTEROL (VENTOLIN HFA) 108 (90 BASE) MCG/ACT INHALER    Inhale 2 puffs into the lungs every 6 (six) hours as needed for wheezing or shortness of breath.   BUDESONIDE-FORMOTEROL (SYMBICORT) 160-4.5 MCG/ACT INHALER       EPINEPHRINE (EPIPEN 2-PAK) 0.3 MG/0.3 ML IJ SOAJ INJECTION    Inject 0.3 mg into the muscle as needed for anaphylaxis.   HALOBETASOL (ULTRAVATE) 0.05 % OINTMENT    APPLY OINTMENT TOPICALLY TO AFFECTED AREA TWICE DAILY AS NEEDED FOR RASH   MONTELUKAST (SINGULAIR) 10 MG TABLET    Take 10 mg by mouth at bedtime.   TRAMADOL (ULTRAM) 50 MG TABLET    Take 1 tablet (50 mg total) by mouth every 6 (six) hours as needed.    Patient Care Team: Maryella Shivers as PCP - General (Physician Assistant)      Objective:   Vitals: BP 137/85 (BP Location: Right Arm, Patient Position: Sitting, Cuff Size: Large)   Pulse 89   Temp (!) 97.3 F (36.3 C) (Temporal)   Ht 5\' 3"  (1.6 m)   Wt (!) 329 lb 6.4 oz (149.4 kg)   SpO2 98%   BMI 58.35 kg/m    Physical Exam Exam conducted with a chaperone present.  Constitutional:      Appearance: Normal appearance. She is obese.  Cardiovascular:     Rate and Rhythm: Normal rate and regular rhythm.     Heart sounds: Normal heart sounds.  Pulmonary:     Effort: Pulmonary effort is normal.     Breath sounds: Normal breath sounds.  Genitourinary:    General: Normal vulva.     Exam position: Lithotomy position.     Labia:        Right: No rash.        Left: No rash.      Vagina: Normal.     Cervix: No discharge, friability, lesion or cervical bleeding.  Skin:    General: Skin is warm and dry.  Neurological:     Mental Status: She is alert and oriented to person, place, and time. Mental status is at baseline.  Psychiatric:        Mood and Affect: Mood normal.        Behavior: Behavior normal.      Depression Screen PHQ 2/9 Scores 10/29/2018 10/22/2018  PHQ - 2  Score 2 2  PHQ- 9 Score 10 12      Assessment & Plan:     Routine Health Maintenance and Physical Exam  Exercise Activities and Dietary recommendations Goals    . "I need a new therapist to help me with my depression (pt-stated)     Current Barriers:  10/24/2018 Mental Health  Concerns   Clinical Social Work Clinical Goal(s):  Marland Kitchen. Over the next 90 days, client will follow up with a outpatient community mental health professional* as directed by SW  Interventions: . Patient interviewed and appropriate assessments performed . Followed up on mental health resources provided to patient to ensure that initial appointment is made . Confirmed that patient did not receive mental health resources provided to her through secure e-mail . Mental health resources sent through secure e-mail again  . Ongoing mental health therapy continues to be recommended to address long history of depression and anxiety   Patient Self Care Activities:  . Performs ADL's independently . Performs IADL's independently . Tends do isolate when anxious or depressed  Please see past updates related to this goal by clicking on the "Past Updates" button in the selected goal         Immunization History  Administered Date(s) Administered  . Pneumococcal Polysaccharide-23 10/22/2018  . Td 01/09/2002    Health Maintenance  Topic Date Due  . TETANUS/TDAP  01/10/2012  . PAP SMEAR-Modifier  12/31/2018  . INFLUENZA VACCINE  04/24/2019 (Originally 08/25/2018)  . HEMOGLOBIN A1C  04/21/2019  . FOOT EXAM  10/22/2019  . URINE MICROALBUMIN  10/22/2019  . OPHTHALMOLOGY EXAM  11/08/2019  . PNEUMOCOCCAL POLYSACCHARIDE VACCINE AGE 8-64 HIGH RISK  Completed  . HIV Screening  Completed     Discussed health benefits of physical activity, and encouraged her to engage in regular exercise appropriate for her age and condition.    1. Annual physical exam  Pap updated today and sent off. Will repeat every 5 years if normal. Due  for tetanus, will give next time due to recent vaccines. Follow up in 4 months for DM.  2. Cervical cancer screening  - Cytology - PAP  3. Morbid obesity (HCC)  She is eating at home instead of fast food more. She is also trying to work on her slushy habit, currently has one daily from [employer].   The entirety of the information documented in the History of Present Illness, Review of Systems and Physical Exam were personally obtained by me. Portions of this information were initially documented by [facility], CMA and reviewed by me for thoroughness and accuracy.       --------------------------------------------------------------------

## 2018-11-26 ENCOUNTER — Telehealth: Payer: Self-pay

## 2018-11-26 ENCOUNTER — Ambulatory Visit: Payer: Self-pay | Admitting: *Deleted

## 2018-11-26 LAB — CYTOLOGY - PAP
Comment: NEGATIVE
Diagnosis: NEGATIVE
High risk HPV: NEGATIVE

## 2018-11-26 NOTE — Telephone Encounter (Signed)
-----   Message from Trinna Post, [place] sent at 11/26/2018  2:55 PM EST ----- Pap normal and hpv negative.

## 2018-11-26 NOTE — Chronic Care Management (AMB) (Signed)
   Care Management   Unsuccessful Call Note 11/26/2018 Name: Kelsey Mcclure MRN: [id number] DOB: Nov 10, 1990  Patient  is a 28 year old female who sees Carles Collet, [place] for primary care. Carles Collet, PA-C asked the CCM team to consult the patient for Mental Health Counseling and Resources.    This social worker was unable to reach patient via telephone today to follow up on mental health resources provided.. I have left HIPAA compliant voicemail asking patient to return my call. (unsuccessful outreach #2).   Plan: Will follow-up within 7 business days via telephone.      Elliot Gurney, [place] Worker  [facility] [phone]

## 2018-11-26 NOTE — Telephone Encounter (Signed)
Patient was advised.  

## 2018-12-03 ENCOUNTER — Telehealth: Payer: Self-pay

## 2018-12-03 ENCOUNTER — Ambulatory Visit: Payer: Self-pay | Admitting: *Deleted

## 2018-12-04 NOTE — Chronic Care Management (AMB) (Signed)
   Chronic Care Management   Unsuccessful Call Note 12/04/2018 Name: Kelsey Mcclure MRN: [id number] DOB: 1990-07-17  Patient  is a 28 year old female who sees Carles Collet PA-C for primary care. Carles Collet PA-Casked the CCM team to consult the patient for Mental Health Counseling and Resources.    This social worker was unable to reach patient via telephone today to follow up on mental health resources provided. I have left HIPAA compliant voicemail asking patient to return my call. (unsuccessful outreach #3).   Plan: This Education officer, museum will not make any further attempts to reach patient, however will be happy to follow up with patient upon her return call.    Elliot Gurney, [place] Worker  [facility] [phone]

## 2018-12-07 ENCOUNTER — Telehealth: Payer: Self-pay | Admitting: Physician Assistant

## 2018-12-07 NOTE — Telephone Encounter (Signed)
Patient refused home sleep study per fax from [facility]

## 2019-01-21 ENCOUNTER — Other Ambulatory Visit: Payer: Self-pay | Admitting: Physician Assistant

## 2019-01-21 DIAGNOSIS — L309 Dermatitis, unspecified: Secondary | ICD-10-CM

## 2019-01-21 MED ORDER — HALOBETASOL PROPIONATE 0.05 % EX OINT
TOPICAL_OINTMENT | CUTANEOUS | 0 refills | Status: DC
Start: 2019-01-21 — End: 2019-04-11

## 2019-01-21 NOTE — Telephone Encounter (Signed)
[facility] faxed refill request for the following medications:  halobetasol (ULTRAVATE) 0.05 % ointment   Please advise.  Thanks, [employer]

## 2019-02-21 ENCOUNTER — Telehealth: Payer: Self-pay | Admitting: Physician Assistant

## 2019-02-21 DIAGNOSIS — J45909 Unspecified asthma, uncomplicated: Secondary | ICD-10-CM

## 2019-02-21 MED ORDER — ALBUTEROL SULFATE HFA 108 (90 BASE) MCG/ACT IN AERS: 2.0000 | INHALATION_SPRAY | Freq: Four times a day (QID) | RESPIRATORY_TRACT | 5 refills | Status: DC | PRN

## 2019-02-21 NOTE — Telephone Encounter (Signed)
L.O.V. was on 11/19/2018 and upcoming appointment on 03/25/2019. Medication send into pharmacy.

## 2019-02-21 NOTE — Telephone Encounter (Signed)
RX REFILL albuterol (VENTOLIN HFA) 108 (90 Base) MCG/ACT inhaler  PHARMACY [id number] - [address]), [place] - [address] Phone:  [phone]  Fax:  [phone]

## 2019-03-01 ENCOUNTER — Other Ambulatory Visit: Payer: Self-pay

## 2019-03-01 MED ORDER — ALBUTEROL SULFATE 0.63 MG/3ML IN NEBU: INHALATION_SOLUTION | RESPIRATORY_TRACT | 1 refills | Status: DC

## 2019-03-01 NOTE — Telephone Encounter (Signed)
Copied from CRM [id number]. Topic: General - Inquiry >> Mar 01, 2019  1:52 PM Leary Roca wrote: Reason for CRM: patient is needing a call back from [facility] for medications . Please advise

## 2019-03-25 ENCOUNTER — Ambulatory Visit: Payer: BLUE CROSS/BLUE SHIELD | Admitting: Physician Assistant

## 2019-03-25 NOTE — Progress Notes (Deleted)
       Patient: Kelsey Mcclure Female    DOB: Aug 14, 1990   28 y.o.   MRN: [id number] Visit Date: 03/25/2019  Today's Provider: Trey Sailors, PA-C   No chief complaint on file.  Subjective:     HPI  Diabetes Mellitus Type II, Follow-up:   Lab Results  Component Value Date   HGBA1C 7.2 (A) 10/22/2018    Last seen for diabetes 5 months ago.  Management since then includes no changes. She reports {excellent/good/fair/poor:19665} compliance with treatment. She {ACTION; IS/IS JSH:[id number]} having side effects. *** Current symptoms include {Symptoms; diabetes:14075} and have been {Desc; course:15616}. Home blood sugar records: {diabetes glucometry results:16657}  Episodes of hypoglycemia? {yes***/no:17258}   {Current insulin regiment:22600::"***":1} Most Recent Eye Exam: *** Weight trend: {trend:16658} {Prior visit with dietician:20300:::1} {Current exercise:16438:::1} {Current diet habits:16563:::1}  Pertinent Labs:    Component Value Date/Time   CHOL 195 10/22/2018 1519   TRIG 106 10/22/2018 1519   HDL 46 10/22/2018 1519   LDLCALC 130 (H) 10/22/2018 1519   CREATININE 0.77 10/22/2018 1519    Wt Readings from Last 3 Encounters:  11/19/18 (!) 329 lb 6.4 oz (149.4 kg)  10/22/18 (!) 328 lb (148.8 kg)  01/13/18 295 lb (133.8 kg)    ------------------------------------------------------------------------    Allergies  Allergen Reactions  . Bee Venom Anaphylaxis  . Peanut Oil Anaphylaxis  . Shellfish Allergy Hives     Current Outpatient Medications:  .  albuterol (ACCUNEB) 0.63 MG/3ML nebulizer solution, USE AS DIRECTED DURING CLINIC VISIT, Disp: 75 mL, Rfl: 1 .  albuterol (VENTOLIN HFA) 108 (90 Base) MCG/ACT inhaler, Inhale 2 puffs into the lungs every 6 (six) hours as needed for wheezing or shortness of breath., Disp: 8 g, Rfl: 5 .  budesonide-formoterol (SYMBICORT) 160-4.5 MCG/ACT inhaler, , Disp: , Rfl:  .  EPINEPHrine (EPIPEN 2-PAK) 0.3 mg/0.3 mL IJ  SOAJ injection, Inject 0.3 mg into the muscle as needed for anaphylaxis., Disp: , Rfl:  .  halobetasol (ULTRAVATE) 0.05 % ointment, APPLY OINTMENT TOPICALLY TO AFFECTED AREA TWICE DAILY AS NEEDED FOR RASH, Disp: 15 g, Rfl: 0 .  montelukast (SINGULAIR) 10 MG tablet, Take 10 mg by mouth at bedtime., Disp: , Rfl:   Review of Systems  Constitutional: Negative.   Respiratory: Negative.   Cardiovascular: Negative.   Gastrointestinal: Negative.   Neurological: Negative.   Hematological: Negative.     Social History   Tobacco Use  . Smoking status: Current Every Day Smoker    Packs/day: 0.50    Types: Cigarettes  . Smokeless tobacco: Never Used  Substance Use Topics  . Alcohol use: Yes      Objective:   There were no vitals taken for this visit. There were no vitals filed for this visit.There is no height or weight on file to calculate BMI.   Physical Exam   No results found for any visits on 03/25/19.     Assessment & Plan        Trey Sailors, PA-C  [facility]

## 2019-04-11 ENCOUNTER — Other Ambulatory Visit: Payer: Self-pay | Admitting: Physician Assistant

## 2019-04-11 DIAGNOSIS — L309 Dermatitis, unspecified: Secondary | ICD-10-CM

## 2019-04-11 NOTE — Telephone Encounter (Signed)
Requested Prescriptions  Pending Prescriptions Disp Refills  . halobetasol (ULTRAVATE) 0.05 % ointment [Pharmacy Med Name: Halobetasol Propionate 0.05 % External Ointment] 15 g 0    Sig: APPLY  OINTMENT TOPICALLY TO AFFECTED AREA TWICE DAILY AS NEEDED FOR  RASH     Dermatology:  Corticosteroids Passed - 04/11/2019 12:25 PM      Passed - Valid encounter within last 12 months    Recent Outpatient Visits          4 months ago Annual physical exam   [facility] Osvaldo Angst M, PA-C   5 months ago Type 2 diabetes mellitus with hyperglycemia, unspecified whether long term insulin use [facility])   [facility] [place], [place], [place]

## 2019-05-01 ENCOUNTER — Other Ambulatory Visit: Payer: Self-pay | Admitting: Physician Assistant

## 2019-05-01 NOTE — Telephone Encounter (Signed)
Requested medication (s) are due for refill today: yes  Requested medication (s) are on the active medication list: yes  Last refill:  03/01/2019  Future visit scheduled: no  Notes to clinic: patient had follow up but no showed appointment    Requested Prescriptions  Pending Prescriptions Disp Refills   albuterol (ACCUNEB) 0.63 MG/3ML nebulizer solution [Pharmacy Med Name: Albuterol Sulfate 0.63 MG/3ML Inhalation Nebulization Solution] 60 mL 0    Sig: USE AS DIRECTED DURING CLINIC VISIT      Pulmonology:  Beta Agonists Failed - 05/01/2019  9:57 AM      Failed - One inhaler should last at least one month. If the patient is requesting refills earlier, contact the patient to check for uncontrolled symptoms.      Passed - Valid encounter within last 12 months    Recent Outpatient Visits           5 months ago Annual physical exam   [facility] Osvaldo Angst M, [place]   6 months ago Type 2 diabetes mellitus with hyperglycemia, unspecified whether long term insulin use [facility])   [facility] [place], [place], [place]

## 2019-05-17 ENCOUNTER — Encounter: Payer: Self-pay | Admitting: Emergency Medicine

## 2019-05-17 ENCOUNTER — Other Ambulatory Visit: Payer: Self-pay

## 2019-05-17 ENCOUNTER — Emergency Department
Admission: EM | Admit: 2019-05-17 | Discharge: 2019-05-17 | Disposition: A | Discharge status: Home / Self Care | Payer: BLUE CROSS/BLUE SHIELD | Attending: Emergency Medicine | Admitting: Emergency Medicine

## 2019-05-17 DIAGNOSIS — Z79899 Other long term (current) drug therapy: Secondary | ICD-10-CM | POA: Insufficient documentation

## 2019-05-17 DIAGNOSIS — J45909 Unspecified asthma, uncomplicated: Secondary | ICD-10-CM | POA: Insufficient documentation

## 2019-05-17 DIAGNOSIS — R040 Epistaxis: Secondary | ICD-10-CM | POA: Insufficient documentation

## 2019-05-17 DIAGNOSIS — F17210 Nicotine dependence, cigarettes, uncomplicated: Secondary | ICD-10-CM | POA: Insufficient documentation

## 2019-05-17 DIAGNOSIS — Z91010 Allergy to peanuts: Secondary | ICD-10-CM | POA: Insufficient documentation

## 2019-05-17 DIAGNOSIS — E119 Type 2 diabetes mellitus without complications: Secondary | ICD-10-CM | POA: Insufficient documentation

## 2019-05-17 HISTORY — DX: Type 2 diabetes mellitus without complications: E11.9

## 2019-05-17 MED ORDER — NEOSPORIN PLUS PAIN RELIEF MS 3.5-10000-10 EX CREA: TOPICAL_CREAM | Freq: Two times a day (BID) | CUTANEOUS | 0 refills | Status: DC

## 2019-05-17 NOTE — ED Notes (Signed)
Pt reports intermittent nose bleeds over the last 4-5 days. Pt reports this morning she had a bad nose bleed that lasted about 30 minutes. Pt states that she had a large clot come out of her nose and then the bleeding slowed down, stopping right before she was called for triage. Pt denies any trauma to her nose, denies use of blood thinners.

## 2019-05-17 NOTE — ED Triage Notes (Signed)
Pt reports intermittent nose bleed for the past few days. Denies other sx's. No bleeding noted at this time. Pt states that her nose will just start bleeding and then stop.

## 2019-05-17 NOTE — ED Provider Notes (Signed)
[facility] Emergency Department Provider Note   ____________________________________________   First MD Initiated Contact with Patient 05/17/19 [phone]     (approximate)  I have reviewed the triage vital signs and the nursing notes.   HISTORY  Chief Complaint Epistaxis    HPI Kelsey Mcclure is a 29 y.o. female patient presents with intermitting nosebleed from the left nostril for last few days.  Patient denies provocative incident for complaint.  Patient stated bleeding was controlled in route.  Patient states bleeding last 3 to 5 minutes.  Patient believes the abrupt change in weather and her allergies is contributing to her nosebleeds.         Past Medical History:  Diagnosis Date  . Asthma   . Diabetes mellitus without complication [facility])     Patient Active Problem List   Diagnosis Date Noted  . Type 2 diabetes mellitus with hyperglycemia (HCC) 10/26/2018  . Morbid obesity (HCC) 10/26/2018  . Depression 10/26/2018  . Asthma 10/26/2018  . Elevated BP without diagnosis of hypertension 10/26/2018  . Posterior subcapsular polar cataract, nonsenile 12/08/2017    Past Surgical History:  Procedure Laterality Date  . CATARACT EXTRACTION, BILATERAL      Prior to Admission medications   Medication Sig Start Date End Date Taking? Authorizing Provider  albuterol (ACCUNEB) 0.63 MG/3ML nebulizer solution USE AS DIRECTED DURING  CLINIC  VISIT 05/01/19   Trey Sailors, PA-C  albuterol (VENTOLIN HFA) 108 (90 Base) MCG/ACT inhaler Inhale 2 puffs into the lungs every 6 (six) hours as needed for wheezing or shortness of breath. 02/21/19   Trey Sailors, PA-C  budesonide-formoterol [facility]) 160-4.5 MCG/ACT inhaler  11/13/17   [provider]  EPINEPHrine (EPIPEN 2-PAK) 0.3 mg/0.3 mL IJ SOAJ injection Inject 0.3 mg into the muscle as needed for anaphylaxis.    [provider]  halobetasol (ULTRAVATE) 0.05 % ointment APPLY  OINTMENT  TOPICALLY TO AFFECTED AREA TWICE DAILY AS NEEDED FOR  RASH 04/11/19   Osvaldo Angst M, PA-C  montelukast (SINGULAIR) 10 MG tablet Take 10 mg by mouth at bedtime.    [provider]  neomycin-polymyxin-pramoxine (NEOSPORIN PLUS) 1 % cream Apply topically 2 (two) times daily. Apply small amount to inner nostrils 05/17/19   Joni Reining, PA-C    Allergies Bee venom, Peanut oil, and Shellfish allergy  Family History  Problem Relation Age of Onset  . Eczema Mother   . Diabetes Father   . Psoriasis Father   . Eczema Father   . Sleep apnea Father   . Asthma Sister   . Eczema Sister   . Eczema Sister     Social History Social History   Tobacco Use  . Smoking status: Current Every Day Smoker    Packs/day: 0.50    Types: Cigarettes  . Smokeless tobacco: Never Used  Substance Use Topics  . Alcohol use: Yes  . Drug use: Yes    Types: Marijuana    Review of Systems  Constitutional: No fever/chills Eyes: No visual changes. ENT: No sore throat.  Intermitting nosebleed Cardiovascular: Denies chest pain. Respiratory: Denies shortness of breath. Gastrointestinal: No abdominal pain.  No nausea, no vomiting.  No diarrhea.  No constipation. Genitourinary: Negative for dysuria. Musculoskeletal: Negative for back pain. Skin: Negative for rash. Neurological: Negative for headaches, focal weakness or numbness. Endocrine:  Diabetes. Allergic/Immunilogical: Bee sting, peanut oil, and shellfish allergies. ____________________________________________   PHYSICAL EXAM:  VITAL SIGNS: ED Triage Vitals  Enc Vitals Group  BP 05/17/19 0927 (!) 146/112     Pulse Rate 05/17/19 0927 80     Resp --      Temp 05/17/19 0928 97.9 F (36.6 C)     Temp Source 05/17/19 0928 Oral     SpO2 05/17/19 0927 94 %     Weight 05/17/19 0927 (!) 310 lb (140.6 kg)     Height 05/17/19 0927 5\' 3"  (1.6 m)     Head Circumference --      Peak Flow --      Pain Score 05/17/19 0927 0     Pain Loc  --      Pain Edu? --      Excl. in [place]? --    Constitutional: Alert and oriented. Well appearing and in no acute distress.  Morbid obesity. Nose: No congestion/rhinnorhea.  No active bleeding.  Nasal polyp visible. Mouth/Throat: Mucous membranes are moist.  Oropharynx non-erythematous. Neck: No stridor.   Cardiovascular: Normal rate, regular rhythm. Grossly normal heart sounds.  Good peripheral circulation. Respiratory: Normal respiratory effort.  No retractions. Lungs CTAB. Skin:  Skin is warm, dry and intact. No rash noted. Psychiatric: Mood and affect are normal. Speech and behavior are normal.  ____________________________________________   LABS (all labs ordered are listed, but only abnormal results are displayed)  Labs Reviewed - No data to display ____________________________________________  EKG   ____________________________________________  RADIOLOGY  ED MD interpretation:    Official radiology report(s): No results found.  ____________________________________________   PROCEDURES  Procedure(s) performed (including Critical Care):  Procedures   ____________________________________________   INITIAL IMPRESSION / ASSESSMENT AND PLAN / ED COURSE  As part of my medical decision making, I reviewed the following data within the [place]     Patient presents with intermitting epistaxis.  Patient had an episode prior to arrival that has resolved.  Patient state at least one episode of per day for the last 4 days.  Patient given discharge care instruction and a nasal clamp.  Patient advised return to ED if condition worsens.   Kelsey Mcclure was evaluated in Emergency Department on 05/17/2019 for the symptoms described in the history of present illness. She was evaluated in the context of the global COVID-19 pandemic, which necessitated consideration that the patient might be at risk for infection with the SARS-CoV-2 virus that causes COVID-19.  Institutional protocols and algorithms that pertain to the evaluation of patients at risk for COVID-19 are in a state of rapid change based on information released by regulatory bodies including the CDC and federal and state organizations. These policies and algorithms were followed during the patient's care in the ED.       ____________________________________________   FINAL CLINICAL IMPRESSION(S) / ED DIAGNOSES  Final diagnoses:  Left-sided epistaxis     ED Discharge Orders         Ordered    neomycin-polymyxin-pramoxine (NEOSPORIN PLUS) 1 % cream  2 times daily     05/17/19 1002           Note:  This document was prepared using Dragon voice recognition software and may include unintentional dictation errors.    Sable Feil, PA-C 05/17/19 1007    Blake Divine, MD 05/18/19 [phone]

## 2019-05-17 NOTE — Discharge Instructions (Addendum)
Follow discharge care instruction use nasal clamp as directed.  Purchase over-the-counter Afrin and apply 2 sprays to nostril that is bleeding before clamping.

## 2019-08-04 ENCOUNTER — Other Ambulatory Visit: Payer: Self-pay | Admitting: Physician Assistant

## 2019-08-04 DIAGNOSIS — L309 Dermatitis, unspecified: Secondary | ICD-10-CM

## 2019-08-04 NOTE — Telephone Encounter (Signed)
Requested Prescriptions  Pending Prescriptions Disp Refills  . halobetasol (ULTRAVATE) 0.05 % ointment [Pharmacy Med Name: Halobetasol Propionate 0.05 % External Ointment] 15 g 0    Sig: APPLY  OINTMENT TOPICALLY TO AFFECTED AREA TWICE DAILY AS NEEDED FOR  RASH     Dermatology:  Corticosteroids Passed - 08/04/2019  1:07 PM      Passed - Valid encounter within last 12 months    Recent Outpatient Visits          8 months ago Annual physical exam   [facility] Osvaldo Angst M, PA-C   9 months ago Type 2 diabetes mellitus with hyperglycemia, unspecified whether long term insulin use [facility])   [facility] [place], [place], [place]

## 2019-08-26 ENCOUNTER — Other Ambulatory Visit: Payer: Self-pay | Admitting: Physician Assistant

## 2019-08-26 DIAGNOSIS — J45909 Unspecified asthma, uncomplicated: Secondary | ICD-10-CM

## 2019-08-26 NOTE — Telephone Encounter (Signed)
[facility] faxed refill request for the following medications:  budesonide-formoterol (SYMBICORT) 160-4.5 MCG/ACT inhaler  Please advise.

## 2019-08-27 MED ORDER — BUDESONIDE-FORMOTEROL FUMARATE 160-4.5 MCG/ACT IN AERO: 2.0000 | INHALATION_SPRAY | Freq: Two times a day (BID) | RESPIRATORY_TRACT | 0 refills | Status: DC

## 2019-08-27 NOTE — Telephone Encounter (Signed)
Called patient and no answer, left voicemail message for patient to return call. If patient calls back okay for PEC to advise and schedule appointment.

## 2019-08-27 NOTE — Telephone Encounter (Signed)
L.O.V. was on 11/19/2018 and medication never filled by you, please advise.

## 2019-08-27 NOTE — Telephone Encounter (Signed)
I Have given one inhaler. I have never filled this before. She is overdue for a follow up and did not show up to follow up in 03/2019. She will need a diabetes follow up/follow up in general for any more refills.

## 2019-08-28 NOTE — Telephone Encounter (Signed)
Call to patient- appointment for follow up scheduled

## 2019-09-02 NOTE — Progress Notes (Deleted)
     Established patient visit   Patient: Kelsey Mcclure   DOB: April 24, 1990   29 y.o. Female  MRN: [id number] Visit Date: 09/04/2019  Today's healthcare provider: Trey Sailors, PA-C   No chief complaint on file.  Subjective    HPI  Diabetes Mellitus Type II, Follow-up  Lab Results  Component Value Date   HGBA1C 7.2 (A) 10/22/2018   Wt Readings from Last 3 Encounters:  05/17/19 (!) 310 lb (140.6 kg)  11/19/18 (!) 329 lb 6.4 oz (149.4 kg)  10/22/18 (!) 328 lb (148.8 kg)   Last seen for diabetes 11 months ago.  Management since then includes ***. She reports {excellent/good/fair/poor:19665} compliance with treatment. She {is/is not:21021397} having side effects. {document side effects if present:1} Symptoms: {Yes/No:20286} fatigue {Yes/No:20286} foot ulcerations  {Yes/No:20286} appetite changes {Yes/No:20286} nausea  {Yes/No:20286} paresthesia of the feet  {Yes/No:20286} polydipsia  {Yes/No:20286} polyuria {Yes/No:20286} visual disturbances   {Yes/No:20286} vomiting     Home blood sugar records: {diabetes glucometry results:16657}  Episodes of hypoglycemia? {Yes/No:20286} {enter symptoms and frequency of symptoms if yes:1}   Current insulin regiment: {enter 'none' or type of insulin and number of units taken with each dose of each insulin formulation that the patient is taking:1} Most Recent Eye Exam: *** {Current exercise:16438:::1} {Current diet habits:16563:::1}  Pertinent Labs: Lab Results  Component Value Date   CHOL 195 10/22/2018   HDL 46 10/22/2018   LDLCALC 130 (H) 10/22/2018   TRIG 106 10/22/2018   CHOLHDL 4.2 10/22/2018   Lab Results  Component Value Date   NA 138 10/22/2018   K 4.3 10/22/2018   CREATININE 0.77 10/22/2018   GFRNONAA 104 10/22/2018   GFRAA 120 10/22/2018   GLUCOSE 111 (H) 10/22/2018     ---------------------------------------------------------------------------------------------------   {Show patient history  (optional):23778::" "}   Medications: Outpatient Medications Prior to Visit  Medication Sig  . albuterol (ACCUNEB) 0.63 MG/3ML nebulizer solution USE AS DIRECTED DURING  CLINIC  VISIT  . albuterol (VENTOLIN HFA) 108 (90 Base) MCG/ACT inhaler Inhale 2 puffs into the lungs every 6 (six) hours as needed for wheezing or shortness of breath.  . budesonide-formoterol (SYMBICORT) 160-4.5 MCG/ACT inhaler Inhale 2 puffs into the lungs in the morning and at bedtime.  Marland Kitchen EPINEPHrine (EPIPEN 2-PAK) 0.3 mg/0.3 mL IJ SOAJ injection Inject 0.3 mg into the muscle as needed for anaphylaxis.  . halobetasol (ULTRAVATE) 0.05 % ointment APPLY  OINTMENT TOPICALLY TO AFFECTED AREA TWICE DAILY AS NEEDED FOR  RASH  . montelukast (SINGULAIR) 10 MG tablet Take 10 mg by mouth at bedtime.  Marland Kitchen neomycin-polymyxin-pramoxine (NEOSPORIN PLUS) 1 % cream Apply topically 2 (two) times daily. Apply small amount to inner nostrils   No facility-administered medications prior to visit.    Review of Systems  {Heme  Chem  Endocrine  Serology  Results Review (optional):23779::" "}  Objective    There were no vitals taken for this visit. {Show previous vital signs (optional):23777::" "}  Physical Exam  ***  No results found for any visits on 09/04/19.  Assessment & Plan     ***  No follow-ups on file.      {provider attestation***:1}   Maryella Shivers  [facility] [phone] (phone) [phone] (fax)  [facility]

## 2019-09-04 ENCOUNTER — Ambulatory Visit: Payer: Self-pay | Admitting: Physician Assistant

## 2019-09-05 ENCOUNTER — Telehealth: Payer: Self-pay | Admitting: Physician Assistant

## 2019-09-05 NOTE — Telephone Encounter (Signed)
Three no shows, dismissal letter sent.

## 2019-09-23 ENCOUNTER — Other Ambulatory Visit: Payer: Self-pay | Admitting: Physician Assistant

## 2019-09-23 DIAGNOSIS — J45909 Unspecified asthma, uncomplicated: Secondary | ICD-10-CM

## 2019-10-17 ENCOUNTER — Emergency Department: Payer: Medicaid Other

## 2019-10-17 ENCOUNTER — Other Ambulatory Visit: Payer: Self-pay

## 2019-10-17 ENCOUNTER — Emergency Department
Admission: EM | Admit: 2019-10-17 | Discharge: 2019-10-17 | Disposition: A | Discharge status: Home / Self Care | Payer: Medicaid Other | Attending: Emergency Medicine | Admitting: Emergency Medicine

## 2019-10-17 ENCOUNTER — Encounter: Payer: Self-pay | Admitting: Emergency Medicine

## 2019-10-17 DIAGNOSIS — Z91010 Allergy to peanuts: Secondary | ICD-10-CM | POA: Insufficient documentation

## 2019-10-17 DIAGNOSIS — Z20822 Contact with and (suspected) exposure to covid-19: Secondary | ICD-10-CM | POA: Insufficient documentation

## 2019-10-17 DIAGNOSIS — J069 Acute upper respiratory infection, unspecified: Secondary | ICD-10-CM | POA: Insufficient documentation

## 2019-10-17 DIAGNOSIS — Z87891 Personal history of nicotine dependence: Secondary | ICD-10-CM | POA: Insufficient documentation

## 2019-10-17 DIAGNOSIS — J4520 Mild intermittent asthma, uncomplicated: Secondary | ICD-10-CM | POA: Insufficient documentation

## 2019-10-17 DIAGNOSIS — Z7951 Long term (current) use of inhaled steroids: Secondary | ICD-10-CM | POA: Insufficient documentation

## 2019-10-17 DIAGNOSIS — E1165 Type 2 diabetes mellitus with hyperglycemia: Secondary | ICD-10-CM | POA: Insufficient documentation

## 2019-10-17 LAB — BASIC METABOLIC PANEL
Anion gap: 7 (ref 5–15)
BUN: 9 mg/dL (ref 6–20)
CO2: 31 mmol/L (ref 22–32)
Calcium: 8.8 mg/dL — ABNORMAL LOW (ref 8.9–10.3)
Chloride: 98 mmol/L (ref 98–111)
Creatinine, Ser: 0.73 mg/dL (ref 0.44–1.00)
GFR calc Af Amer: >60 mL/min (ref >60)
GFR calc non Af Amer: >60 mL/min (ref >60)
Glucose, Bld: 117 mg/dL — ABNORMAL HIGH (ref 70–99)
Potassium: 3.8 mmol/L (ref 3.5–5.1)
Sodium: 136 mmol/L (ref 135–145)

## 2019-10-17 LAB — CBC
HCT: 39.3 % (ref 36.0–46.0)
Hemoglobin: 12.4 g/dL (ref 12.0–15.0)
MCH: 24.8 pg — ABNORMAL LOW (ref 26.0–34.0)
MCHC: 31.6 g/dL (ref 30.0–36.0)
MCV: 78.6 fL — ABNORMAL LOW (ref 80.0–100.0)
Platelets: 347 10*3/uL (ref 150–400)
RBC: 5 MIL/uL (ref 3.87–5.11)
RDW: 15.4 % (ref 11.5–15.5)
WBC: 12.5 10*3/uL — ABNORMAL HIGH (ref 4.0–10.5)
nRBC: 0 % (ref 0.0–0.2)

## 2019-10-17 LAB — RESPIRATORY PANEL BY RT PCR (FLU A&B, COVID)
Influenza A by PCR: NEGATIVE
Influenza B by PCR: NEGATIVE
SARS Coronavirus 2 by RT PCR: NEGATIVE

## 2019-10-17 LAB — TROPONIN I (HIGH SENSITIVITY): Troponin I (High Sensitivity): 5 ng/L (ref <18)

## 2019-10-17 MED ORDER — PREDNISONE 10 MG PO TABS: ORAL_TABLET | ORAL | 0 refills | Status: DC

## 2019-10-17 MED ORDER — DEXAMETHASONE SODIUM PHOSPHATE 10 MG/ML IJ SOLN
INTRAMUSCULAR | Status: AC
  Filled 2019-10-17: qty 2

## 2019-10-17 MED ORDER — IPRATROPIUM-ALBUTEROL 0.5-2.5 (3) MG/3ML IN SOLN
3.0000 mL | Freq: Once | RESPIRATORY_TRACT | Status: AC
  Administered 2019-10-17: 3 mL via RESPIRATORY_TRACT
  Filled 2019-10-17: qty 3

## 2019-10-17 MED ORDER — DEXAMETHASONE 10 MG/ML FOR PEDIATRIC ORAL USE
16.0000 mg | Freq: Once | INTRAMUSCULAR | Status: AC
  Administered 2019-10-17: 16 mg via ORAL
  Filled 2019-10-17: qty 2

## 2019-10-17 NOTE — ED Provider Notes (Signed)
[facility] Emergency Department Provider Note  ____________________________________________  Time seen: Approximately 11:59 AM  I have reviewed the triage vital signs and the nursing notes.   HISTORY  Chief Complaint Shortness of Breath    HPI Kelsey Mcclure is a 29 y.o. female that presents to the emergency department for evaluation of wheezing, nonproductive cough, shortness of breath since last night.  Patient has a history of asthma and this feels like a typical exacerbation of her asthma.  She has an inhaler and a nebulizer at home that she has been using without relief.  She usually requires steroids when this happens.  No sick contacts.  No known fever.  No nasal congestion, vomiting, abdominal pain, diarrhea.   Past Medical History:  Diagnosis Date  . Asthma   . Diabetes mellitus without complication [facility])     Patient Active Problem List   Diagnosis Date Noted  . Type 2 diabetes mellitus with hyperglycemia (HCC) 10/26/2018  . Morbid obesity (HCC) 10/26/2018  . Depression 10/26/2018  . Asthma 10/26/2018  . Elevated BP without diagnosis of hypertension 10/26/2018  . Posterior subcapsular polar cataract, nonsenile 12/08/2017    Past Surgical History:  Procedure Laterality Date  . CATARACT EXTRACTION, BILATERAL      Prior to Admission medications   Medication Sig Start Date End Date Taking? Authorizing Provider  albuterol (ACCUNEB) 0.63 MG/3ML nebulizer solution USE AS DIRECTED DURING  CLINIC  VISIT 09/23/19   Trey Sailors, PA-C  albuterol (VENTOLIN HFA) 108 (90 Base) MCG/ACT inhaler INHALE 2 PUFFS INTO LUNGS EVERY 6 HOURS AS NEEDED FOR WHEEZING OR SHORTNESS OF BREATH 09/23/19   Osvaldo Angst M, PA-C  budesonide-formoterol (SYMBICORT) 160-4.5 MCG/ACT inhaler Inhale 2 puffs into the lungs in the morning and at bedtime. 08/27/19   Trey Sailors, PA-C  EPINEPHrine (EPIPEN 2-PAK) 0.3 mg/0.3 mL IJ SOAJ injection Inject 0.3 mg into the  muscle as needed for anaphylaxis.    [provider]  halobetasol (ULTRAVATE) 0.05 % ointment APPLY  OINTMENT TOPICALLY TO AFFECTED AREA TWICE DAILY AS NEEDED FOR  RASH 08/04/19   Osvaldo Angst M, PA-C  montelukast (SINGULAIR) 10 MG tablet Take 10 mg by mouth at bedtime.    [provider]  neomycin-polymyxin-pramoxine (NEOSPORIN PLUS) 1 % cream Apply topically 2 (two) times daily. Apply small amount to inner nostrils 05/17/19   Joni Reining, PA-C  predniSONE (DELTASONE) 10 MG tablet Take 6 tablets on day 1, take 5 tablets on day 2, take 4 tablets on day 3, take 3 tablets on day 4, take 2 tablets on day 5, take 1 tablet on day 6 10/17/19   Enid Derry, PA-C    Allergies Bee venom, Peanut oil, and Shellfish allergy  Family History  Problem Relation Age of Onset  . Eczema Mother   . Diabetes Father   . Psoriasis Father   . Eczema Father   . Sleep apnea Father   . Asthma Sister   . Eczema Sister   . Eczema Sister     Social History Social History   Tobacco Use  . Smoking status: Former Smoker    Packs/day: 0.50    Types: Cigarettes    Quit date: 04/16/2019    Years since quitting: 0.5  . Smokeless tobacco: Never Used  Vaping Use  . Vaping Use: Never used  Substance Use Topics  . Alcohol use: Yes  . Drug use: Yes    Types: Marijuana     Review of Systems  Constitutional: No fever/chills Eyes: No visual changes. No discharge. ENT: Negative for congestion and rhinorrhea. Cardiovascular: No chest pain. Respiratory: Positive for cough and SOB. Gastrointestinal: No abdominal pain.  No nausea, no vomiting.  No diarrhea.  No constipation. Musculoskeletal: Negative for musculoskeletal pain. Skin: Negative for rash, abrasions, lacerations, ecchymosis. Neurological: Negative for headaches.   ____________________________________________   PHYSICAL EXAM:  VITAL SIGNS: ED Triage Vitals  Enc Vitals Group     BP 10/17/19 0901 (!) 148/98     Pulse Rate  10/17/19 0901 99     Resp 10/17/19 0901 (!) 22     Temp 10/17/19 0901 99.7 F (37.6 C)     Temp src --      SpO2 10/17/19 0901 97 %     Weight 10/17/19 0856 (!) 320 lb (145.2 kg)     Height 10/17/19 0856 5\' 3"  (1.6 m)     Head Circumference --      Peak Flow --      Pain Score 10/17/19 0856 7     Pain Loc --      Pain Edu? --      Excl. in [place]? --      Constitutional: Alert and oriented. Well appearing and in no acute distress. Eyes: Conjunctivae are normal. PERRL. EOMI. No discharge. Head: Atraumatic. ENT: No frontal and maxillary sinus tenderness.      Ears: Tympanic membranes pearly gray with good landmarks. No discharge.      Nose: No congestion/rhinnorhea.      Mouth/Throat: Mucous membranes are moist. Oropharynx non-erythematous. Tonsils not enlarged. No exudates. Uvula midline. Neck: No stridor.   Hematological/Lymphatic/Immunilogical: No cervical lymphadenopathy. Cardiovascular: Normal rate, regular rhythm.  Good peripheral circulation. Respiratory: Normal respiratory effort without tachypnea or retractions. Scattered wheezes. Good air entry to the bases with no decreased or absent breath sounds. Gastrointestinal: Bowel sounds 4 quadrants. Soft and nontender to palpation. No guarding or rigidity. No palpable masses. No distention. Musculoskeletal: Full range of motion to all extremities. No gross deformities appreciated. Neurologic:  Normal speech and language. No gross focal neurologic deficits are appreciated.  Skin:  Skin is warm, dry and intact. No rash noted. Psychiatric: Mood and affect are normal. Speech and behavior are normal. Patient exhibits appropriate insight and judgement.   ____________________________________________   LABS (all labs ordered are listed, but only abnormal results are displayed)  Labs Reviewed  BASIC METABOLIC PANEL - Abnormal; Notable for the following components:      Result Value   Glucose, Bld 117 (*)    Calcium 8.8 (*)    All  other components within normal limits  CBC - Abnormal; Notable for the following components:   WBC 12.5 (*)    MCV 78.6 (*)    MCH 24.8 (*)    All other components within normal limits  RESPIRATORY PANEL BY RT PCR (FLU A&B, COVID)  POC URINE PREG, ED  TROPONIN I (HIGH SENSITIVITY)  TROPONIN I (HIGH SENSITIVITY)   ____________________________________________  EKG   ____________________________________________  RADIOLOGY 10/19/19, personally viewed and evaluated these images (plain radiographs) as part of my medical decision making, as well as reviewing the written report by the radiologist.  DG Chest 2 View  Result Date: 10/17/2019 CLINICAL DATA:  Chest pain and shortness of breath since last night EXAM: CHEST - 2 VIEW COMPARISON:  None. FINDINGS: The heart size and mediastinal contours are within normal limits. Both lungs are clear. The visualized skeletal structures are unremarkable. IMPRESSION: No active  cardiopulmonary disease. Electronically Signed   By: Sherian Rein M.D.   On: 10/17/2019 09:36    ____________________________________________    PROCEDURES  Procedure(s) performed:    Procedures    Medications  dexamethasone (DECADRON) 10 MG/ML injection (has no administration in time range)  ipratropium-albuterol (DUONEB) 0.5-2.5 (3) MG/3ML nebulizer solution 3 mL (3 mLs Nebulization Given 10/17/19 1219)  dexamethasone (DECADRON) 10 MG/ML injection for Pediatric ORAL use 16 mg (16 mg Oral Given 10/17/19 1219)     ____________________________________________   INITIAL IMPRESSION / ASSESSMENT AND PLAN / ED COURSE  Pertinent labs & imaging results that were available during my care of the patient were reviewed by me and considered in my medical decision making (see chart for details).  Review of the [place] CSRS was performed in accordance of the [facility] prior to dispensing any controlled drugs.   Patient's diagnosis is consistent with asthma exacerbation and  viral URI. Vital signs and exam are reassuring.  Patient has a mild leukocytosis of 12.5.  Remaining lab work is largely unremarkable.  Troponin within reference range.  NSR on EKG.  Symptoms improved after a DuoNeb treatment.  Patient was given a dose of Decadron in the emergency department.  Patient feels comfortable going home. Patient will be discharged home with prescriptions for prednisone. Patient is to follow up with primary care as needed or otherwise directed. Patient is given ED precautions to return to the ED for any worsening or new symptoms.   Kelsey Mcclure was evaluated in Emergency Department on 10/17/2019 for the symptoms described in the history of present illness. She was evaluated in the context of the global COVID-19 pandemic, which necessitated consideration that the patient might be at risk for infection with the SARS-CoV-2 virus that causes COVID-19. Institutional protocols and algorithms that pertain to the evaluation of patients at risk for COVID-19 are in a state of rapid change based on information released by regulatory bodies including the CDC and federal and state organizations. These policies and algorithms were followed during the patient's care in the ED.  ____________________________________________  FINAL CLINICAL IMPRESSION(S) / ED DIAGNOSES  Final diagnoses:  Viral URI with cough  Mild intermittent asthma, unspecified whether complicated      NEW MEDICATIONS STARTED DURING THIS VISIT:  ED Discharge Orders         Ordered    predniSONE (DELTASONE) 10 MG tablet        10/17/19 1331              This chart was dictated using voice recognition software/Dragon. Despite best efforts to proofread, errors can occur which can change the meaning. Any change was purely unintentional.    Enid Derry, PA-C 10/17/19 1510    Shaune Pollack, MD 10/18/19 [phone]

## 2019-10-17 NOTE — ED Notes (Signed)
See triage note   Presents with some cough and diff breathing   States cough started about 4-5 days ago   States she has gotten relief with SVN at home until last pm

## 2019-10-17 NOTE — ED Triage Notes (Signed)
Says short of breath since last night with wheezing.  History of asthma.  Says has been using inhaler and nebs without releif.

## 2019-10-23 ENCOUNTER — Other Ambulatory Visit: Payer: Self-pay | Admitting: Physician Assistant

## 2019-10-23 DIAGNOSIS — J45909 Unspecified asthma, uncomplicated: Secondary | ICD-10-CM

## 2019-10-23 DIAGNOSIS — L309 Dermatitis, unspecified: Secondary | ICD-10-CM

## 2019-10-26 ENCOUNTER — Other Ambulatory Visit: Payer: Self-pay | Admitting: Physician Assistant

## 2019-10-26 DIAGNOSIS — L309 Dermatitis, unspecified: Secondary | ICD-10-CM

## 2019-10-26 DIAGNOSIS — J45909 Unspecified asthma, uncomplicated: Secondary | ICD-10-CM

## 2019-10-28 ENCOUNTER — Telehealth: Payer: Self-pay | Admitting: Physician Assistant

## 2019-10-28 NOTE — Telephone Encounter (Signed)
Patient requesting budesonide-formoterol (SYMBICORT) 160-4.5 MCG/ACT inhaler and halobetasol (ULTRAVATE) 0.05 % ointment. Informed patient please allow 48 to 72 hour turn around time.   Informed patient of dismal letter patient denied receiving letter and would like a follow up call regarding being dismissed from the practice.   [facility] [address] ([address]), [place] - [address] Phone:  [phone]  Fax:  [phone]

## 2019-10-29 NOTE — Telephone Encounter (Signed)
Called and left message for the patient.  Patient was discharged from the practice due to several no-shows or same day cancellation.

## 2019-10-30 NOTE — Telephone Encounter (Signed)
Patient called back was informed of discharge from the practice was not very happy about the news. pec

## 2019-11-14 ENCOUNTER — Observation Stay
Admission: EM | Admit: 2019-11-14 | Discharge: 2019-11-15 | Disposition: A | Discharge status: Home / Self Care | Payer: Medicaid Other | Attending: Internal Medicine | Admitting: Internal Medicine

## 2019-11-14 ENCOUNTER — Other Ambulatory Visit: Payer: Self-pay

## 2019-11-14 ENCOUNTER — Encounter: Payer: Self-pay | Admitting: Intensive Care

## 2019-11-14 ENCOUNTER — Emergency Department: Payer: Medicaid Other

## 2019-11-14 DIAGNOSIS — J45901 Unspecified asthma with (acute) exacerbation: Principal | ICD-10-CM | POA: Insufficient documentation

## 2019-11-14 DIAGNOSIS — R030 Elevated blood-pressure reading, without diagnosis of hypertension: Secondary | ICD-10-CM | POA: Diagnosis present

## 2019-11-14 DIAGNOSIS — Z20822 Contact with and (suspected) exposure to covid-19: Secondary | ICD-10-CM | POA: Insufficient documentation

## 2019-11-14 DIAGNOSIS — J45909 Unspecified asthma, uncomplicated: Secondary | ICD-10-CM

## 2019-11-14 DIAGNOSIS — Z87891 Personal history of nicotine dependence: Secondary | ICD-10-CM | POA: Insufficient documentation

## 2019-11-14 DIAGNOSIS — E1165 Type 2 diabetes mellitus with hyperglycemia: Secondary | ICD-10-CM | POA: Diagnosis present

## 2019-11-14 DIAGNOSIS — Z91010 Allergy to peanuts: Secondary | ICD-10-CM | POA: Insufficient documentation

## 2019-11-14 DIAGNOSIS — Z79899 Other long term (current) drug therapy: Secondary | ICD-10-CM | POA: Insufficient documentation

## 2019-11-14 HISTORY — DX: Dermatitis, unspecified: L30.9

## 2019-11-14 LAB — BASIC METABOLIC PANEL
Anion gap: 9 (ref 5–15)
BUN: 12 mg/dL (ref 6–20)
CO2: 31 mmol/L (ref 22–32)
Calcium: 8.8 mg/dL — ABNORMAL LOW (ref 8.9–10.3)
Chloride: 98 mmol/L (ref 98–111)
Creatinine, Ser: 0.86 mg/dL (ref 0.44–1.00)
GFR, Estimated: >60 mL/min (ref >60)
Glucose, Bld: 236 mg/dL — ABNORMAL HIGH (ref 70–99)
Potassium: 4 mmol/L (ref 3.5–5.1)
Sodium: 138 mmol/L (ref 135–145)

## 2019-11-14 LAB — GLUCOSE, CAPILLARY
Glucose-Capillary: 188 mg/dL — ABNORMAL HIGH (ref 70–99)
Glucose-Capillary: 285 mg/dL — ABNORMAL HIGH (ref 70–99)

## 2019-11-14 LAB — BLOOD GAS, VENOUS
Acid-Base Excess: 8 mmol/L — ABNORMAL HIGH (ref 0.0–2.0)
Bicarbonate: 34.9 mmol/L — ABNORMAL HIGH (ref 20.0–28.0)
FIO2: 0.21
O2 Saturation: 88.1 %
Patient temperature: 37
pCO2, Ven: 59 mmHg (ref 44.0–60.0)
pH, Ven: 7.38 (ref 7.250–7.430)
pO2, Ven: 56 mmHg — ABNORMAL HIGH (ref 32.0–45.0)

## 2019-11-14 LAB — CBC WITH DIFFERENTIAL/PLATELET
Abs Immature Granulocytes: 0.03 10*3/uL (ref 0.00–0.07)
Basophils Absolute: 0.1 10*3/uL (ref 0.0–0.1)
Basophils Relative: 1 %
Eosinophils Absolute: 0.7 10*3/uL — ABNORMAL HIGH (ref 0.0–0.5)
Eosinophils Relative: 7 %
HCT: 38.6 % (ref 36.0–46.0)
Hemoglobin: 11.8 g/dL — ABNORMAL LOW (ref 12.0–15.0)
Immature Granulocytes: 0 %
Lymphocytes Relative: 27 %
Lymphs Abs: 2.5 10*3/uL (ref 0.7–4.0)
MCH: 24.9 pg — ABNORMAL LOW (ref 26.0–34.0)
MCHC: 30.6 g/dL (ref 30.0–36.0)
MCV: 81.4 fL (ref 80.0–100.0)
Monocytes Absolute: 0.6 10*3/uL (ref 0.1–1.0)
Monocytes Relative: 7 %
Neutro Abs: 5.7 10*3/uL (ref 1.7–7.7)
Neutrophils Relative %: 58 %
Platelets: 331 10*3/uL (ref 150–400)
RBC: 4.74 MIL/uL (ref 3.87–5.11)
RDW: 15.5 % (ref 11.5–15.5)
WBC: 9.6 10*3/uL (ref 4.0–10.5)
nRBC: 0 % (ref 0.0–0.2)

## 2019-11-14 LAB — RESPIRATORY PANEL BY RT PCR (FLU A&B, COVID)
Influenza A by PCR: NEGATIVE
Influenza B by PCR: NEGATIVE
SARS Coronavirus 2 by RT PCR: NEGATIVE

## 2019-11-14 LAB — TROPONIN I (HIGH SENSITIVITY): Troponin I (High Sensitivity): 5 ng/L (ref <18)

## 2019-11-14 MED ORDER — ENOXAPARIN SODIUM 80 MG/0.8ML ~~LOC~~ SOLN
0.5000 mg/kg | SUBCUTANEOUS | Status: DC
  Administered 2019-11-14: 72.5 mg via SUBCUTANEOUS
  Filled 2019-11-14 (×2): qty 0.8

## 2019-11-14 MED ORDER — IPRATROPIUM-ALBUTEROL 0.5-2.5 (3) MG/3ML IN SOLN
3.0000 mL | RESPIRATORY_TRACT | Status: DC
  Administered 2019-11-14 – 2019-11-15 (×6): 3 mL via RESPIRATORY_TRACT
  Filled 2019-11-14 (×6): qty 3

## 2019-11-14 MED ORDER — METHYLPREDNISOLONE SODIUM SUCC 125 MG IJ SOLR
60.0000 mg | Freq: Two times a day (BID) | INTRAMUSCULAR | Status: DC
  Administered 2019-11-14 – 2019-11-15 (×3): 60 mg via INTRAVENOUS
  Filled 2019-11-14 (×3): qty 2

## 2019-11-14 MED ORDER — ALBUTEROL SULFATE (2.5 MG/3ML) 0.083% IN NEBU: 2.5000 mg | INHALATION_SOLUTION | RESPIRATORY_TRACT | Status: DC | PRN

## 2019-11-14 MED ORDER — MONTELUKAST SODIUM 10 MG PO TABS
10.0000 mg | ORAL_TABLET | Freq: Every day | ORAL | Status: DC
  Administered 2019-11-14: 10 mg via ORAL
  Filled 2019-11-14 (×2): qty 1

## 2019-11-14 MED ORDER — PREDNISONE 20 MG PO TABS
60.0000 mg | ORAL_TABLET | Freq: Once | ORAL | Status: AC
  Administered 2019-11-14: 60 mg via ORAL
  Filled 2019-11-14: qty 3

## 2019-11-14 MED ORDER — INSULIN ASPART 100 UNIT/ML ~~LOC~~ SOLN
0.0000 [IU] | Freq: Three times a day (TID) | SUBCUTANEOUS | Status: DC
  Administered 2019-11-14: 2 [IU] via SUBCUTANEOUS
  Administered 2019-11-15: 3 [IU] via SUBCUTANEOUS
  Administered 2019-11-15: 08:00:00 2 [IU] via SUBCUTANEOUS
  Filled 2019-11-14 (×3): qty 1

## 2019-11-14 MED ORDER — CLOBETASOL PROPIONATE 0.05 % EX CREA
TOPICAL_CREAM | Freq: Two times a day (BID) | CUTANEOUS | Status: DC
  Filled 2019-11-14: qty 15

## 2019-11-14 MED ORDER — ENOXAPARIN SODIUM 40 MG/0.4ML ~~LOC~~ SOLN: 40.0000 mg | SUBCUTANEOUS | Status: DC

## 2019-11-14 MED ORDER — HYDRALAZINE HCL 20 MG/ML IJ SOLN
5.0000 mg | INTRAMUSCULAR | Status: DC | PRN
  Filled 2019-11-14: qty 1

## 2019-11-14 MED ORDER — DM-GUAIFENESIN ER 30-600 MG PO TB12
1.0000 | ORAL_TABLET | Freq: Two times a day (BID) | ORAL | Status: DC | PRN
  Administered 2019-11-15: 15:00:00 1 via ORAL
  Filled 2019-11-14: qty 1

## 2019-11-14 MED ORDER — MAGNESIUM SULFATE 2 GM/50ML IV SOLN
2.0000 g | Freq: Once | INTRAVENOUS | Status: AC
  Administered 2019-11-14: 2 g via INTRAVENOUS
  Filled 2019-11-14: qty 50

## 2019-11-14 MED ORDER — INSULIN ASPART 100 UNIT/ML ~~LOC~~ SOLN
0.0000 [IU] | Freq: Every day | SUBCUTANEOUS | Status: DC
  Administered 2019-11-14: 3 [IU] via SUBCUTANEOUS
  Filled 2019-11-14: qty 1

## 2019-11-14 MED ORDER — IPRATROPIUM-ALBUTEROL 0.5-2.5 (3) MG/3ML IN SOLN
9.0000 mL | Freq: Once | RESPIRATORY_TRACT | Status: AC
  Administered 2019-11-14: 9 mL via RESPIRATORY_TRACT
  Filled 2019-11-14: qty 9

## 2019-11-14 MED ORDER — ACETAMINOPHEN 325 MG PO TABS
650.0000 mg | ORAL_TABLET | Freq: Four times a day (QID) | ORAL | Status: DC | PRN
  Administered 2019-11-15: 08:00:00 650 mg via ORAL
  Filled 2019-11-14: qty 2

## 2019-11-14 MED ORDER — ONDANSETRON HCL 4 MG/2ML IJ SOLN: 4.0000 mg | Freq: Three times a day (TID) | INTRAMUSCULAR | Status: DC | PRN

## 2019-11-14 MED ORDER — ALBUTEROL SULFATE (2.5 MG/3ML) 0.083% IN NEBU
2.5000 mg | INHALATION_SOLUTION | Freq: Once | RESPIRATORY_TRACT | Status: AC
  Administered 2019-11-14: 2.5 mg via RESPIRATORY_TRACT
  Filled 2019-11-14: qty 3

## 2019-11-14 MED ORDER — AMLODIPINE BESYLATE 5 MG PO TABS
5.0000 mg | ORAL_TABLET | Freq: Every day | ORAL | Status: DC
  Administered 2019-11-14 – 2019-11-15 (×2): 5 mg via ORAL
  Filled 2019-11-14 (×2): qty 1

## 2019-11-14 MED ORDER — HALOBETASOL PROPIONATE 0.05 % EX CREA: 1.0000 "application " | TOPICAL_CREAM | Freq: Two times a day (BID) | CUTANEOUS | Status: DC

## 2019-11-14 NOTE — H&P (Signed)
History and Physical    Kelsey Mcclure WVP:[id number] DOB: Jan 17, 1991 DOA: 11/14/2019  Referring MD/NP/PA:   PCP: Patient, No Pcp Per   Patient coming from:  The patient is coming from home.  At baseline, pt is independent for most of ADL.        Chief Complaint: Shortness of breath  HPI: Kelsey Mcclure is a 29 y.o. female with medical history significant of possible hypertension, diabetes mellitus, asthma, depression, eczema, obesity, who presents with shortness breath.  Patient states that she has been having shortness of breath in the past 3 days, which has been progressively worsening.  She also has dry cough and wheezing.  No chest pain, fever or chills.  Denies nausea, vomiting, diarrhea, abdominal pain, symptoms of UTI or unilateral weakness.  ED Course: pt was found to have negative Covid PCR, electrolytes renal function okay, WBC 9.6, troponin 5, temperature 99, blood pressure 195/102, 151/96, heart rate 100, RR 22, oxygen saturation 92--95% on room air.  Chest x-ray is reviewed personally, which is negative for acute issues.  Patient is placed on MedSurg bed for observation.  Review of Systems:   General: no fevers, chills, no body weight gain, has fatigue HEENT: no blurry vision, hearing changes or sore throat Respiratory: has dyspnea, coughing, wheezing CV: no chest pain, no palpitations GI: no nausea, vomiting, abdominal pain, diarrhea, constipation GU: no dysuria, burning on urination, increased urinary frequency, hematuria  Ext: no leg edema Neuro: no unilateral weakness, numbness, or tingling, no vision change or hearing loss Skin: no rash, no skin tear. MSK: No muscle spasm, no deformity, no limitation of range of movement in spin Heme: No easy bruising.  Travel history: No recent long distant travel.  Allergy:  Allergies  Allergen Reactions  . Bee Venom Anaphylaxis  . Peanut Oil Anaphylaxis  . Shellfish Allergy Hives    Past Medical History:   Diagnosis Date  . Asthma   . Diabetes mellitus without complication (HCC)   . Eczema     Past Surgical History:  Procedure Laterality Date  . CATARACT EXTRACTION, BILATERAL      Social History:  reports that she quit smoking about 6 months ago. Her smoking use included cigarettes. She smoked 0.50 packs per day. She has never used smokeless tobacco. She reports current alcohol use of about 4.0 standard drinks of alcohol per week. She reports current drug use. Drug: Marijuana.  Family History:  Family History  Problem Relation Age of Onset  . Eczema Mother   . Diabetes Father   . Psoriasis Father   . Eczema Father   . Sleep apnea Father   . Asthma Sister   . Eczema Sister   . Eczema Sister      Prior to Admission medications   Medication Sig Start Date End Date Taking? Authorizing Provider  albuterol (ACCUNEB) 0.63 MG/3ML nebulizer solution USE AS DIRECTED DURING  CLINIC  VISIT 09/23/19   Trey Sailors, PA-C  albuterol (VENTOLIN HFA) 108 (90 Base) MCG/ACT inhaler INHALE 2 PUFFS INTO LUNGS EVERY 6 HOURS AS NEEDED FOR WHEEZING OR SHORTNESS OF BREATH 09/23/19   Osvaldo Angst M, PA-C  budesonide-formoterol (SYMBICORT) 160-4.5 MCG/ACT inhaler Inhale 2 puffs into the lungs in the morning and at bedtime. 08/27/19   Trey Sailors, PA-C  EPINEPHrine (EPIPEN 2-PAK) 0.3 mg/0.3 mL IJ SOAJ injection Inject 0.3 mg into the muscle as needed for anaphylaxis.    [provider]  halobetasol (ULTRAVATE) 0.05 % ointment APPLY  OINTMENT TOPICALLY TO  AFFECTED AREA TWICE DAILY AS NEEDED FOR  RASH 08/04/19   Osvaldo Angst M, PA-C  montelukast (SINGULAIR) 10 MG tablet Take 10 mg by mouth at bedtime.    [provider]  neomycin-polymyxin-pramoxine (NEOSPORIN PLUS) 1 % cream Apply topically 2 (two) times daily. Apply small amount to inner nostrils 05/17/19   Joni Reining, PA-C  predniSONE (DELTASONE) 10 MG tablet Take 6 tablets on day 1, take 5 tablets on day 2, take 4 tablets on  day 3, take 3 tablets on day 4, take 2 tablets on day 5, take 1 tablet on day 6 10/17/19   Enid Derry, PA-C    Physical Exam: Vitals:   11/14/19 1130 11/14/19 1330 11/14/19 1400 11/14/19 1629  BP: (!) 170/106 (!) 178/85 (!) 151/96 (!) 154/100  Pulse: 87 100 89 87  Resp: 19 18 19 19   Temp:      TempSrc:      SpO2: 92% 95% 91% 99%  Weight:      Height:       General: Not in acute distress HEENT:       Eyes: PERRL, EOMI, no scleral icterus.       ENT: No discharge from the ears and nose, no pharynx injection, no tonsillar enlargement.        Neck: No JVD, no bruit, no mass felt. Heme: No neck lymph node enlargement. Cardiac: S1/S2, RRR, No murmurs, No gallops or rubs. Respiratory: Has wheezing bilaterally GI: Soft, nondistended, nontender, no rebound pain, no organomegaly, BS present. GU: No hematuria Ext: No pitting leg edema bilaterally. 2+DP/PT pulse bilaterally. Musculoskeletal: No joint deformities, No joint redness or warmth, no limitation of ROM in spin. Skin: No rashes.  Neuro: Alert, oriented X3, cranial nerves II-XII grossly intact, moves all extremities normally. Psych: Patient is not psychotic, no suicidal or hemocidal ideation.  Labs on Admission: I have personally reviewed following labs and imaging studies  CBC: Recent Labs  Lab 11/14/19 1121  WBC 9.6  NEUTROABS 5.7  HGB 11.8*  HCT 38.6  MCV 81.4  PLT 331   Basic Metabolic Panel: Recent Labs  Lab 11/14/19 1121  NA 138  K 4.0  CL 98  CO2 31  GLUCOSE 236*  BUN 12  CREATININE 0.86  CALCIUM 8.8*   GFR: Estimated Creatinine Clearance: 133.9 mL/min (by C-G formula based on SCr of 0.86 mg/dL). Liver Function Tests: No results for input(s): AST, ALT, ALKPHOS, BILITOT, PROT, ALBUMIN in the last 168 hours. No results for input(s): LIPASE, AMYLASE in the last 168 hours. No results for input(s): AMMONIA in the last 168 hours. Coagulation Profile: No results for input(s): INR, PROTIME in the last 168  hours. Cardiac Enzymes: No results for input(s): CKTOTAL, CKMB, CKMBINDEX, TROPONINI in the last 168 hours. BNP (last 3 results) No results for input(s): PROBNP in the last 8760 hours. HbA1C: No results for input(s): HGBA1C in the last 72 hours. CBG: Recent Labs  Lab 11/14/19 1627  GLUCAP 188*   Lipid Profile: No results for input(s): CHOL, HDL, LDLCALC, TRIG, CHOLHDL, LDLDIRECT in the last 72 hours. Thyroid Function Tests: No results for input(s): TSH, T4TOTAL, FREET4, T3FREE, THYROIDAB in the last 72 hours. Anemia Panel: No results for input(s): VITAMINB12, FOLATE, FERRITIN, TIBC, IRON, RETICCTPCT in the last 72 hours. Urine analysis: No results found for: COLORURINE, APPEARANCEUR, LABSPEC, PHURINE, GLUCOSEU, HGBUR, BILIRUBINUR, KETONESUR, PROTEINUR, UROBILINOGEN, NITRITE, LEUKOCYTESUR Sepsis Labs: @LABRCNTIP (procalcitonin:4,lacticidven:4) ) Recent Results (from the past 240 hour(s))  Respiratory Panel by RT PCR (Flu  A&B, Covid) - Nasopharyngeal Swab     Status: None   Collection Time: 11/14/19  2:18 PM   Specimen: Nasopharyngeal Swab  Result Value Ref Range Status   SARS Coronavirus 2 by RT PCR NEGATIVE NEGATIVE Final    Comment: (NOTE) SARS-CoV-2 target nucleic acids are NOT DETECTED.  The SARS-CoV-2 RNA is generally detectable in upper respiratoy specimens during the acute phase of infection. The lowest concentration of SARS-CoV-2 viral copies this assay can detect is 131 copies/mL. A negative result does not preclude SARS-Cov-2 infection and should not be used as the sole basis for treatment or other patient management decisions. A negative result may occur with  improper specimen collection/handling, submission of specimen other than nasopharyngeal swab, presence of viral mutation(s) within the areas targeted by this assay, and inadequate number of viral copies (<131 copies/mL). A negative result must be combined with clinical observations, patient history, and  epidemiological information. The expected result is Negative.  Fact Sheet for Patients:  https://www.moore.com/https://www.fda.gov/media/142436/download  Fact Sheet for Healthcare Providers:  https://www.young.biz/https://www.fda.gov/media/142435/download  This test is no t yet approved or cleared by the [place] FDA and  has been authorized for detection and/or diagnosis of SARS-CoV-2 by FDA under an Emergency Use Authorization (EUA). This EUA will remain  in effect (meaning this test can be used) for the duration of the COVID-19 declaration under Section 564(b)(1) of the Act, 21 U.S.C. section 360bbb-3(b)(1), unless the authorization is terminated or revoked sooner.     Influenza A by PCR NEGATIVE NEGATIVE Final   Influenza B by PCR NEGATIVE NEGATIVE Final    Comment: (NOTE) The Xpert Xpress SARS-CoV-2/FLU/RSV assay is intended as an aid in  the diagnosis of influenza from Nasopharyngeal swab specimens and  should not be used as a sole basis for treatment. Nasal washings and  aspirates are unacceptable for Xpert Xpress SARS-CoV-2/FLU/RSV  testing.  Fact Sheet for Patients: https://www.moore.com/https://www.fda.gov/media/142436/download  Fact Sheet for Healthcare Providers: https://www.young.biz/https://www.fda.gov/media/142435/download  This test is not yet approved or cleared by the [place] FDA and  has been authorized for detection and/or diagnosis of SARS-CoV-2 by  FDA under an Emergency Use Authorization (EUA). This EUA will remain  in effect (meaning this test can be used) for the duration of the  Covid-19 declaration under Section 564(b)(1) of the Act, 21  U.S.C. section 360bbb-3(b)(1), unless the authorization is  terminated or revoked. Performed at [facility], [address] [place], [place] [id number]      Radiological Exams on Admission: DG Chest 2 View  Result Date: 11/14/2019 CLINICAL DATA:  Cough and wheeze EXAM: CHEST - 2 VIEW COMPARISON:  10/17/2019 FINDINGS: Normal heart size and stable mediastinal contours. Mild  prominence of markings which is stable. No clear bronchial cuffing. No acute infiltrate or edema. No effusion or pneumothorax. No acute osseous findings. IMPRESSION: Stable exam.  No focal abnormality. Electronically Signed   By: Marnee SpringJonathon  Watts M.D.   On: 11/14/2019 10:12     EKG: I have personally reviewed.  Sinus rhythm, QTC 442, no ischemic change  Assessment/Plan Principal Problem:   Asthma exacerbation Active Problems:   Type 2 diabetes mellitus with hyperglycemia (HCC)   Elevated BP without diagnosis of hypertension   Asthma exacerbation: Oxygen saturation 92-95% on room air.  Chest x-ray negative for infiltration.  -will place to med-surg bed for observation -Bronchodilators -Singulair -Solu-Medrol 60 mg IV bid -Mucinex for cough  -Nasal cannula oxygen as needed to maintain O2 saturation 93% or greater  Type 2 diabetes  mellitus with hyperglycemia (HCC): Recent A1c 7.2, poorly controlled.  Patient is not taking medications at home.  Blood sugar 236 -Sliding scale insulin  Elevated BP without diagnosis of hypertension: Blood pressure 195/102, 151/96.  Patient likely has undiagnosed hypertension. -Start amlodipine 5 mg daily -IV hydralazine as needed     DVT ppx:  SQ Lovenox Code Status: Full code Family Communication: not done, no family member is at bed side. Disposition Plan:  Anticipate discharge back to previous environment Consults called:  none Admission status: Med-surg bed for obs   Status is: Observation  The patient remains OBS appropriate and will d/c before 2 midnights.  Dispo: The patient is from: Home              Anticipated d/c is to: Home              Anticipated d/c date is: 1 day              Patient currently is not medically stable to d/c.           Date of Service 11/14/2019    Lorretta Harp [facility]   If 7PM-7AM, please contact night-coverage [email or web address] 11/14/2019, 5:06 PM

## 2019-11-14 NOTE — ED Provider Notes (Signed)
[facility] Emergency Department Provider Note   ____________________________________________   First MD Initiated Contact with Patient 11/14/19 1046     (approximate)  I have reviewed the triage vital signs and the nursing notes.   HISTORY  Chief Complaint Cough, Wheezing, Headache, and Hypertension    HPI Kelsey Mcclure is a 29 y.o. female with possible history of asthma, diabetes, and eczema who presents to the ED complaining of cough and shortness of breath.  Patient reports that she has had increasing cough over the past couple of days with occasional difficulty breathing.  She also feels tight in her chest but denies any overt chest pain.  She has not had any fevers, nausea, vomiting, diarrhea, dysuria, or hematuria.  She states symptoms are similar to prior asthma exacerbations and she has not had any fevers or sick contacts.        Past Medical History:  Diagnosis Date  . Asthma   . Diabetes mellitus without complication (HCC)   . Eczema     Patient Active Problem List   Diagnosis Date Noted  . Type 2 diabetes mellitus with hyperglycemia (HCC) 10/26/2018  . Morbid obesity (HCC) 10/26/2018  . Depression 10/26/2018  . Asthma exacerbation 10/26/2018  . Elevated BP without diagnosis of hypertension 10/26/2018  . Posterior subcapsular polar cataract, nonsenile 12/08/2017    Past Surgical History:  Procedure Laterality Date  . CATARACT EXTRACTION, BILATERAL      Prior to Admission medications   Medication Sig Start Date End Date Taking? Authorizing Provider  albuterol (ACCUNEB) 0.63 MG/3ML nebulizer solution USE AS DIRECTED DURING  CLINIC  VISIT 09/23/19   Trey Sailors, PA-C  albuterol (VENTOLIN HFA) 108 (90 Base) MCG/ACT inhaler INHALE 2 PUFFS INTO LUNGS EVERY 6 HOURS AS NEEDED FOR WHEEZING OR SHORTNESS OF BREATH 09/23/19   Osvaldo Angst M, PA-C  budesonide-formoterol (SYMBICORT) 160-4.5 MCG/ACT inhaler Inhale 2 puffs into the  lungs in the morning and at bedtime. 08/27/19   Trey Sailors, PA-C  EPINEPHrine (EPIPEN 2-PAK) 0.3 mg/0.3 mL IJ SOAJ injection Inject 0.3 mg into the muscle as needed for anaphylaxis.    [provider]  halobetasol (ULTRAVATE) 0.05 % ointment APPLY  OINTMENT TOPICALLY TO AFFECTED AREA TWICE DAILY AS NEEDED FOR  RASH 08/04/19   Osvaldo Angst M, PA-C  montelukast (SINGULAIR) 10 MG tablet Take 10 mg by mouth at bedtime.    [provider]  neomycin-polymyxin-pramoxine (NEOSPORIN PLUS) 1 % cream Apply topically 2 (two) times daily. Apply small amount to inner nostrils 05/17/19   Joni Reining, PA-C  predniSONE (DELTASONE) 10 MG tablet Take 6 tablets on day 1, take 5 tablets on day 2, take 4 tablets on day 3, take 3 tablets on day 4, take 2 tablets on day 5, take 1 tablet on day 6 10/17/19   Enid Derry, PA-C    Allergies Bee venom, Peanut oil, and Shellfish allergy  Family History  Problem Relation Age of Onset  . Eczema Mother   . Diabetes Father   . Psoriasis Father   . Eczema Father   . Sleep apnea Father   . Asthma Sister   . Eczema Sister   . Eczema Sister     Social History Social History   Tobacco Use  . Smoking status: Former Smoker    Packs/day: 0.50    Types: Cigarettes    Quit date: 04/16/2019    Years since quitting: 0.5  . Smokeless tobacco: Never Used  Vaping  Use  . Vaping Use: Never used  Substance Use Topics  . Alcohol use: Yes    Alcohol/week: 4.0 standard drinks    Types: 4 Shots of liquor per week  . Drug use: Yes    Types: Marijuana    Review of Systems  Constitutional: No fever/chills Eyes: No visual changes. ENT: No sore throat. Cardiovascular: Denies chest pain. Respiratory: Positive for cough and shortness of breath. Gastrointestinal: No abdominal pain.  No nausea, no vomiting.  No diarrhea.  No constipation. Genitourinary: Negative for dysuria. Musculoskeletal: Negative for back pain. Skin: Negative for  rash. Neurological: Negative for headaches, focal weakness or numbness.  ____________________________________________   PHYSICAL EXAM:  VITAL SIGNS: ED Triage Vitals  Enc Vitals Group     BP 11/14/19 0940 (!) 195/102     Pulse Rate 11/14/19 0940 98     Resp 11/14/19 0940 (!) 22     Temp 11/14/19 0940 99 F (37.2 C)     Temp Source 11/14/19 0940 Oral     SpO2 11/14/19 0939 93 %     Weight 11/14/19 0940 (!) 320 lb (145.2 kg)     Height 11/14/19 0940 5\' 3"  (1.6 m)     Head Circumference --      Peak Flow --      Pain Score 11/14/19 0940 0     Pain Loc --      Pain Edu? --      Excl. in GC? --     Constitutional: Alert and oriented. Eyes: Conjunctivae are normal. Head: Atraumatic. Nose: No congestion/rhinnorhea. Mouth/Throat: Mucous membranes are moist. Neck: Normal ROM Cardiovascular: Normal rate, regular rhythm. Grossly normal heart sounds. Respiratory: Mildly increased respiratory effort.  No retractions. Lungs with inspiratory and expiratory wheezing throughout. Gastrointestinal: Soft and nontender. No distention. Genitourinary: deferred Musculoskeletal: No lower extremity tenderness nor edema. Neurologic:  Normal speech and language. No gross focal neurologic deficits are appreciated. Skin:  Skin is warm, dry and intact. No rash noted. Psychiatric: Mood and affect are normal. Speech and behavior are normal.  ____________________________________________   LABS (all labs ordered are listed, but only abnormal results are displayed)  Labs Reviewed  CBC WITH DIFFERENTIAL/PLATELET - Abnormal; Notable for the following components:      Result Value   Hemoglobin 11.8 (*)    MCH 24.9 (*)    Eosinophils Absolute 0.7 (*)    All other components within normal limits  BASIC METABOLIC PANEL - Abnormal; Notable for the following components:   Glucose, Bld 236 (*)    Calcium 8.8 (*)    All other components within normal limits  BLOOD GAS, VENOUS - Abnormal; Notable for the  following components:   pO2, Ven 56.0 (*)    Bicarbonate 34.9 (*)    Acid-Base Excess 8.0 (*)    All other components within normal limits  RESPIRATORY PANEL BY RT PCR (FLU A&B, COVID)  TROPONIN I (HIGH SENSITIVITY)   ____________________________________________  EKG  ED ECG REPORT I, 11/16/19, the attending physician, personally viewed and interpreted this ECG.   Date: 11/14/2019  EKG Time: 11:26  Rate: 91  Rhythm: normal sinus rhythm  Axis: Normal  Intervals:none  ST&T Change: None   PROCEDURES  Procedure(s) performed (including Critical Care):  .1-3 Lead EKG Interpretation Performed by: 11/16/2019, MD Authorized by: Chesley Noon, MD     Interpretation: normal     ECG rate:  86   ECG rate assessment: normal     Rhythm: sinus rhythm  Ectopy: none     Conduction: normal       ____________________________________________   INITIAL IMPRESSION / ASSESSMENT AND PLAN / ED COURSE       29 year old female with past medical history of asthma, diabetes, and eczema who presents to the ED with increasing cough and difficulty breathing over the past couple of days.  She is not in any respiratory distress, but does have significant wheezing on exam.  We will treat with prednisone and DuoNeb x3.  Chest x-ray reviewed by me and is negative for infiltrate or edema.  Patient noted to be hypertensive in triage, which we will continue to monitor as we treat her wheezing.  We will also screen EKG and labs, thus far no symptoms to suggest hypertensive emergency.  EKG shows no evidence of arrhythmia or ischemia and troponin is negative, I doubt ACS.  Remainder of labs are also reassuring.  Patient continues to have significant wheezing despite DuoNeb x3, prednisone, and IV magnesium.  She will occasionally fall asleep and have O2 sats drop down into the 80s, but this significantly improves when she is woken up.  When awake, O2 sat remains around 92 to 93% on room air.   Given her asthma exacerbation has been slow to respond to treatment thus far, case discussed with hospitalist for admission.      ____________________________________________   FINAL CLINICAL IMPRESSION(S) / ED DIAGNOSES  Final diagnoses:  Exacerbation of asthma, unspecified asthma severity, unspecified whether persistent     ED Discharge Orders    None       Note:  This document was prepared using Dragon voice recognition software and may include unintentional dictation errors.   Chesley Noon, MD 11/14/19 1429

## 2019-11-14 NOTE — ED Notes (Signed)
Placed pt on 2 L 

## 2019-11-14 NOTE — ED Triage Notes (Signed)
Patient presents with cough, wheezing, intermittent headaches, and HTN. HX asthma. Reports little relief with inhalers. Denies SOB

## 2019-11-15 DIAGNOSIS — J45901 Unspecified asthma with (acute) exacerbation: Secondary | ICD-10-CM

## 2019-11-15 LAB — CBC
HCT: 38.8 % (ref 36.0–46.0)
Hemoglobin: 11.9 g/dL — ABNORMAL LOW (ref 12.0–15.0)
MCH: 25.1 pg — ABNORMAL LOW (ref 26.0–34.0)
MCHC: 30.7 g/dL (ref 30.0–36.0)
MCV: 81.9 fL (ref 80.0–100.0)
Platelets: 349 10*3/uL (ref 150–400)
RBC: 4.74 MIL/uL (ref 3.87–5.11)
RDW: 15.6 % — ABNORMAL HIGH (ref 11.5–15.5)
WBC: 11.1 10*3/uL — ABNORMAL HIGH (ref 4.0–10.5)
nRBC: 0 % (ref 0.0–0.2)

## 2019-11-15 LAB — BASIC METABOLIC PANEL
Anion gap: 10 (ref 5–15)
BUN: 10 mg/dL (ref 6–20)
CO2: 32 mmol/L (ref 22–32)
Calcium: 9.4 mg/dL (ref 8.9–10.3)
Chloride: 99 mmol/L (ref 98–111)
Creatinine, Ser: 0.87 mg/dL (ref 0.44–1.00)
GFR, Estimated: >60 mL/min (ref >60)
Glucose, Bld: 158 mg/dL — ABNORMAL HIGH (ref 70–99)
Potassium: 4.1 mmol/L (ref 3.5–5.1)
Sodium: 141 mmol/L (ref 135–145)

## 2019-11-15 LAB — GLUCOSE, CAPILLARY
Glucose-Capillary: 155 mg/dL — ABNORMAL HIGH (ref 70–99)
Glucose-Capillary: 212 mg/dL — ABNORMAL HIGH (ref 70–99)

## 2019-11-15 LAB — HIV ANTIBODY (ROUTINE TESTING W REFLEX): HIV Screen 4th Generation wRfx: NONREACTIVE

## 2019-11-15 MED ORDER — PREDNISONE 10 MG PO TABS: ORAL_TABLET | ORAL | 0 refills | Status: DC

## 2019-11-15 MED ORDER — HYDROCHLOROTHIAZIDE 25 MG PO TABS: 25.0000 mg | ORAL_TABLET | Freq: Every day | ORAL | 0 refills | Status: DC

## 2019-11-15 MED ORDER — FUROSEMIDE 10 MG/ML IJ SOLN
40.0000 mg | Freq: Once | INTRAMUSCULAR | Status: AC
  Administered 2019-11-15: 40 mg via INTRAVENOUS
  Filled 2019-11-15: qty 4

## 2019-11-15 MED ORDER — DM-GUAIFENESIN ER 30-600 MG PO TB12: 1.0000 | ORAL_TABLET | Freq: Two times a day (BID) | ORAL | 0 refills | Status: DC | PRN

## 2019-11-15 MED ORDER — ALBUTEROL SULFATE HFA 108 (90 BASE) MCG/ACT IN AERS: 2.0000 | INHALATION_SPRAY | RESPIRATORY_TRACT | 0 refills | Status: DC | PRN

## 2019-11-15 NOTE — Plan of Care (Signed)
Pt Aaox4, VS stable, on room air  while ambulating 95%. Pt up ad lib. No complaints. Independent with ADLs.  IV removed . Discharge instruction reviewed with patient no questions. Plan to discharge home with self care mother to transport pt

## 2019-11-15 NOTE — TOC Transition Note (Signed)
Transition of Care [facility]) - CM/SW Discharge Note   Patient Details  Name: Kelsey Mcclure MRN: [id number] Date of Birth: Sep 17, 1990  [facility]) CM/SW Contact:  Allayne Butcher, RN Phone Number: 11/15/2019, 3:17 PM   Clinical Narrative:    Patient placed in observation for asthma exacerbation, now medically cleared for discharge home.  Patient's mother is here to pick her up.  Patient is independent and works at [employer], patient reports that her insurance does not kick in until Dec.  Referral placed for [facility] and [facility]ication Management.  Today patient will get her prescriptions filled at [facility], she uses the [facility] app.   Patient given application for [facility].    Final next level of care: Home/Self Care Barriers to Discharge: Barriers Resolved   Patient Goals and CMS Choice        Discharge Placement                       Discharge Plan and Services   Discharge Planning Services: CM Consult, [facility], Medication Assistance                                 Social Determinants of Health (SDOH) Interventions     Readmission Risk Interventions No flowsheet data found.

## 2019-11-16 NOTE — Discharge Summary (Signed)
[facility] Discharge Summary   Patient: Kelsey Mcclure WCH:[id number]  PCP: Patient, No Pcp Per  Date of admission: 11/14/2019   Date of discharge: 11/15/2019     Discharge Diagnoses:   Principal Problem:   Asthma exacerbation Active Problems:   Type 2 diabetes mellitus with hyperglycemia (HCC)   Elevated BP without diagnosis of hypertension  Admitted From: Home Disposition:  Home   Recommendations for Outpatient Follow-up:  1. PCP: Follow-up with PCP in 1 week. 2. Follow up LABS/TEST: None   Diet recommendation: Cardiac diet  Activity: The patient is advised to gradually reintroduce usual activities, as tolerated  Discharge Condition: stable  Code Status: Full code   History of present illness: As per the H and P dictated on admission, "Kelsey Mcclure is a 29 y.o. female with medical history significant of possible hypertension, diabetes mellitus, asthma, depression, eczema, obesity, who presents with shortness breath.  Patient states that she has been having shortness of breath in the past 3 days, which has been progressively worsening.  She also has dry cough and wheezing.  No chest pain, fever or chills.  Denies nausea, vomiting, diarrhea, abdominal pain, symptoms of UTI or unilateral weakness."  Hospital Course:  Summary of her active problems in the hospital is as following. Asthma exacerbation:  Oxygen saturation 92-95% on room air.   Chest x-ray negative for infiltration. Continue albuterol, continue Symbicort, prednisone taper.  Type 2 diabetes mellitus with hyperglycemia (HCC):  Recent A1c 7.2, poorly controlled.   Patient is not taking medications at home.   Currently recommend outpatient follow-up with PCP before initiating some medication.  Elevated BP without diagnosis of hypertension:  Blood pressure 195/102, 151/96.  Patient likely has undiagnosed hypertension. Initially started on Norvasc. We will change to HCTZ..  Morbid  obesity Likely has sleep apnea. Patient will benefit from dietary consultation outpatient. Case management consulted assist with PCP. Will need further work-up for sleep apnea outpatient as well. Body mass index is 56.69 kg/m.   Patient was ambulatory without any assistance. On the day of the discharge the patient's vitals were stable, and no other acute medical condition were reported by patient. The patient was felt safe to be discharge at Home with no therapy needed on discharge.  Consultants: none Procedures: none  Discharge Exam: General: Appear in no distress, no Rash; Oral Mucosa Clear, moist. no Abnormal Neck Mass Or lumps, Conjunctiva normal  Cardiovascular: S1 and S2 Present, no Murmur Respiratory: good respiratory effort, Bilateral Air entry present and CTA, no Crackles, no wheezes Abdomen: Bowel Sound present, Soft and no tenderness Extremities: bilateral Pedal edema Neurology: alert and oriented to time, place, and person affect appropriate. no new focal deficit  Filed Weights   11/14/19 0940  Weight: (!) 145.2 kg   Vitals:   11/15/19 0734 11/15/19 1233  BP:  123/67  Pulse:  89  Resp:  19  Temp:  97.9 F (36.6 C)  SpO2: 90% 91%    DISCHARGE MEDICATION: Allergies as of 11/15/2019      Reactions   Bee Venom Anaphylaxis   Peanut Oil Anaphylaxis   Shellfish Allergy Hives      Medication List    TAKE these medications   albuterol 0.63 MG/3ML nebulizer solution Commonly known as: ACCUNEB USE AS DIRECTED DURING  CLINIC  VISIT What changed: Another medication with the same name was changed. Make sure you understand how and when to take each.   albuterol 108 (90 Base) MCG/ACT inhaler Commonly known as: VENTOLIN HFA  Inhale 2 puffs into the lungs every 4 (four) hours as needed for wheezing or shortness of breath. What changed: See the new instructions.   budesonide-formoterol 160-4.5 MCG/ACT inhaler Commonly known as: Symbicort Inhale 2 puffs into the  lungs in the morning and at bedtime.   dextromethorphan-guaiFENesin 30-600 MG 12hr tablet Commonly known as: MUCINEX DM Take 1 tablet by mouth 2 (two) times daily as needed for cough.   EpiPen 2-Pak 0.3 mg/0.3 mL Soaj injection Generic drug: EPINEPHrine Inject 0.3 mg into the muscle as needed for anaphylaxis.   halobetasol 0.05 % cream Commonly known as: ULTRAVATE Apply 1 application topically 2 (two) times daily. Apply topically twice daily after showers   hydrochlorothiazide 25 MG tablet Commonly known as: HYDRODIURIL Take 1 tablet (25 mg total) by mouth daily.   montelukast 10 MG tablet Commonly known as: SINGULAIR Take 10 mg by mouth at bedtime.   naproxen sodium 220 MG tablet Commonly known as: ALEVE Take 440 mg by mouth. Take 2 tabs (440 mg total) by mouth as needed for pain.   predniSONE 10 MG tablet Commonly known as: DELTASONE Take 40mg  daily for 3days,Take 30mg  daily for 3days,Take 20mg  daily for 3days,Take 10mg  daily for 3days, then stop      Allergies  Allergen Reactions  . Bee Venom Anaphylaxis  . Peanut Oil Anaphylaxis  . Shellfish Allergy Hives   Discharge Instructions    Diet - low sodium heart healthy   Complete by: As directed    Increase activity slowly   Complete by: As directed       The results of significant diagnostics from this hospitalization (including imaging, microbiology, ancillary and laboratory) are listed below for reference.    Significant Diagnostic Studies: DG Chest 2 View  Result Date: 11/14/2019 CLINICAL DATA:  Cough and wheeze EXAM: CHEST - 2 VIEW COMPARISON:  10/17/2019 FINDINGS: Normal heart size and stable mediastinal contours. Mild prominence of markings which is stable. No clear bronchial cuffing. No acute infiltrate or edema. No effusion or pneumothorax. No acute osseous findings. IMPRESSION: Stable exam.  No focal abnormality. Electronically Signed   By: M.D.   On: 11/14/2019 10:12     Microbiology: Recent Results (from the past 240 hour(s))  Respiratory Panel by RT PCR (Flu A&B, Covid) - Nasopharyngeal Swab     Status: None   Collection Time: 11/14/19  2:18 PM   Specimen: Nasopharyngeal Swab  Result Value Ref Range Status   SARS Coronavirus 2 by RT PCR NEGATIVE NEGATIVE Final    Comment: (NOTE) SARS-CoV-2 target nucleic acids are NOT DETECTED.  The SARS-CoV-2 RNA is generally detectable in upper respiratoy specimens during the acute phase of infection. The lowest concentration of SARS-CoV-2 viral copies this assay can detect is 131 copies/mL. A negative result does not preclude SARS-Cov-2 infection and should not be used as the sole basis for treatment or other patient management decisions. A negative result may occur with  improper specimen collection/handling, submission of specimen other than nasopharyngeal swab, presence of viral mutation(s) within the areas targeted by this assay, and inadequate number of viral copies (<131 copies/mL). A negative result must be combined with clinical observations, patient history, and epidemiological information. The expected result is Negative.  Fact Sheet for Patients:  10/19/2019  Fact Sheet for Healthcare Providers:  [place]  This test is no t yet approved or cleared by the 11/16/2019 FDA and  has been authorized for detection and/or diagnosis of SARS-CoV-2 by FDA under an Emergency  Use Authorization (EUA). This EUA will remain  in effect (meaning this test can be used) for the duration of the COVID-19 declaration under Section 564(b)(1) of the Act, 21 U.S.C. section 360bbb-3(b)(1), unless the authorization is terminated or revoked sooner.     Influenza A by PCR NEGATIVE NEGATIVE Final   Influenza B by PCR NEGATIVE NEGATIVE Final    Comment: (NOTE) The Xpert Xpress SARS-CoV-2/FLU/RSV assay is intended as an aid in  the diagnosis of  influenza from Nasopharyngeal swab specimens and  should not be used as a sole basis for treatment. Nasal washings and  aspirates are unacceptable for Xpert Xpress SARS-CoV-2/FLU/RSV  testing.  Fact Sheet for Patients: https://www.moore.com/  Fact Sheet for Healthcare Providers: https://www.young.biz/  This test is not yet approved or cleared by the [place] FDA and  has been authorized for detection and/or diagnosis of SARS-CoV-2 by  FDA under an Emergency Use Authorization (EUA). This EUA will remain  in effect (meaning this test can be used) for the duration of the  Covid-19 declaration under Section 564(b)(1) of the Act, 21  U.S.C. section 360bbb-3(b)(1), unless the authorization is  terminated or revoked. Performed at [facility] [address] [place], [place] [address]      Labs: CBC: Recent Labs  Lab 11/14/19 1121 11/15/19 0449  WBC 9.6 11.1*  NEUTROABS 5.7  --   HGB 11.8* 11.9*  HCT 38.6 38.8  MCV 81.4 81.9  PLT 331 349   Basic Metabolic Panel: Recent Labs  Lab 11/14/19 1121 11/15/19 0449  NA 138 141  K 4.0 4.1  CL 98 99  CO2 31 32  GLUCOSE 236* 158*  BUN 12 10  CREATININE 0.86 0.87  CALCIUM 8.8* 9.4   Liver Function Tests: No results for input(s): AST, ALT, ALKPHOS, BILITOT, PROT, ALBUMIN in the last 168 hours. CBG: Recent Labs  Lab 11/14/19 1627 11/14/19 2219 11/15/19 0724 11/15/19 1233  GLUCAP 188* 285* 155* 212*    Time spent: 35 minutes  Signed:  Lynden Oxford  [facility] 11/15/2019  6:00 PM

## 2019-11-25 ENCOUNTER — Telehealth: Payer: Self-pay | Admitting: Pharmacy Technician

## 2019-11-25 NOTE — Telephone Encounter (Signed)
Provided patient with new patient packet to obtain [facility] services.  [facility] must receive requested financial documentation within 30 days in order to determine eligibility and provide medication assistance.  Ayn Domangue J. Kylon Philbrook Care Manager [facility]  

## 2019-12-15 ENCOUNTER — Emergency Department: Payer: Medicaid Other

## 2019-12-15 ENCOUNTER — Encounter: Payer: Self-pay | Admitting: Emergency Medicine

## 2019-12-15 ENCOUNTER — Other Ambulatory Visit: Payer: Self-pay

## 2019-12-15 ENCOUNTER — Inpatient Hospital Stay
Admission: EM | Admit: 2019-12-15 | Discharge: 2019-12-17 | DRG: 189 | Disposition: A | Discharge status: Home / Self Care | Payer: Medicaid Other | Attending: Internal Medicine | Admitting: Internal Medicine

## 2019-12-15 DIAGNOSIS — Z6841 Body Mass Index (BMI) 40.0 and over, adult: Secondary | ICD-10-CM

## 2019-12-15 DIAGNOSIS — J9601 Acute respiratory failure with hypoxia: Principal | ICD-10-CM | POA: Diagnosis present

## 2019-12-15 DIAGNOSIS — I10 Essential (primary) hypertension: Secondary | ICD-10-CM | POA: Diagnosis present

## 2019-12-15 DIAGNOSIS — J45909 Unspecified asthma, uncomplicated: Secondary | ICD-10-CM

## 2019-12-15 DIAGNOSIS — Z9841 Cataract extraction status, right eye: Secondary | ICD-10-CM

## 2019-12-15 DIAGNOSIS — Z79899 Other long term (current) drug therapy: Secondary | ICD-10-CM

## 2019-12-15 DIAGNOSIS — Z7952 Long term (current) use of systemic steroids: Secondary | ICD-10-CM

## 2019-12-15 DIAGNOSIS — F32A Depression, unspecified: Secondary | ICD-10-CM | POA: Diagnosis present

## 2019-12-15 DIAGNOSIS — Z91013 Allergy to seafood: Secondary | ICD-10-CM

## 2019-12-15 DIAGNOSIS — Z833 Family history of diabetes mellitus: Secondary | ICD-10-CM

## 2019-12-15 DIAGNOSIS — J45901 Unspecified asthma with (acute) exacerbation: Secondary | ICD-10-CM | POA: Diagnosis present

## 2019-12-15 DIAGNOSIS — E119 Type 2 diabetes mellitus without complications: Secondary | ICD-10-CM | POA: Diagnosis present

## 2019-12-15 DIAGNOSIS — Z20822 Contact with and (suspected) exposure to covid-19: Secondary | ICD-10-CM | POA: Diagnosis present

## 2019-12-15 DIAGNOSIS — Z87891 Personal history of nicotine dependence: Secondary | ICD-10-CM

## 2019-12-15 DIAGNOSIS — Z91030 Bee allergy status: Secondary | ICD-10-CM

## 2019-12-15 DIAGNOSIS — G43909 Migraine, unspecified, not intractable, without status migrainosus: Secondary | ICD-10-CM | POA: Diagnosis present

## 2019-12-15 DIAGNOSIS — Z9842 Cataract extraction status, left eye: Secondary | ICD-10-CM

## 2019-12-15 LAB — RESP PANEL BY RT-PCR (FLU A&B, COVID) ARPGX2
Influenza A by PCR: NEGATIVE
Influenza B by PCR: NEGATIVE
SARS Coronavirus 2 by RT PCR: NEGATIVE

## 2019-12-15 LAB — COMPREHENSIVE METABOLIC PANEL
ALT: 22 U/L (ref 0–44)
AST: 18 U/L (ref 15–41)
Albumin: 3.5 g/dL (ref 3.5–5.0)
Alkaline Phosphatase: 63 U/L (ref 38–126)
Anion gap: 7 (ref 5–15)
BUN: 12 mg/dL (ref 6–20)
CO2: 34 mmol/L — ABNORMAL HIGH (ref 22–32)
Calcium: 8.5 mg/dL — ABNORMAL LOW (ref 8.9–10.3)
Chloride: 97 mmol/L — ABNORMAL LOW (ref 98–111)
Creatinine, Ser: 0.75 mg/dL (ref 0.44–1.00)
GFR, Estimated: >60 mL/min (ref >60)
Glucose, Bld: 143 mg/dL — ABNORMAL HIGH (ref 70–99)
Potassium: 3.8 mmol/L (ref 3.5–5.1)
Sodium: 138 mmol/L (ref 135–145)
Total Bilirubin: 0.5 mg/dL (ref 0.3–1.2)
Total Protein: 7.4 g/dL (ref 6.5–8.1)

## 2019-12-15 LAB — CBC WITH DIFFERENTIAL/PLATELET
Abs Immature Granulocytes: 0.04 10*3/uL (ref 0.00–0.07)
Basophils Absolute: 0.1 10*3/uL (ref 0.0–0.1)
Basophils Relative: 1 %
Eosinophils Absolute: 0.5 10*3/uL (ref 0.0–0.5)
Eosinophils Relative: 5 %
HCT: 40 % (ref 36.0–46.0)
Hemoglobin: 11.8 g/dL — ABNORMAL LOW (ref 12.0–15.0)
Immature Granulocytes: 0 %
Lymphocytes Relative: 28 %
Lymphs Abs: 2.8 10*3/uL (ref 0.7–4.0)
MCH: 24.6 pg — ABNORMAL LOW (ref 26.0–34.0)
MCHC: 29.5 g/dL — ABNORMAL LOW (ref 30.0–36.0)
MCV: 83.3 fL (ref 80.0–100.0)
Monocytes Absolute: 0.7 10*3/uL (ref 0.1–1.0)
Monocytes Relative: 7 %
Neutro Abs: 5.7 10*3/uL (ref 1.7–7.7)
Neutrophils Relative %: 59 %
Platelets: 340 10*3/uL (ref 150–400)
RBC: 4.8 MIL/uL (ref 3.87–5.11)
RDW: 15.9 % — ABNORMAL HIGH (ref 11.5–15.5)
WBC: 10 10*3/uL (ref 4.0–10.5)
nRBC: 0 % (ref 0.0–0.2)

## 2019-12-15 LAB — HCG, QUANTITATIVE, PREGNANCY: hCG, Beta Chain, Quant, S: <1 m[IU]/mL (ref <5)

## 2019-12-15 MED ORDER — DOCUSATE SODIUM 100 MG PO CAPS: 100.0000 mg | ORAL_CAPSULE | Freq: Two times a day (BID) | ORAL | Status: DC | PRN

## 2019-12-15 MED ORDER — GUAIFENESIN-DM 100-10 MG/5ML PO SYRP: 10.0000 mL | ORAL_SOLUTION | Freq: Four times a day (QID) | ORAL | Status: DC | PRN

## 2019-12-15 MED ORDER — ONDANSETRON 4 MG PO TBDP
4.0000 mg | ORAL_TABLET | Freq: Three times a day (TID) | ORAL | Status: DC | PRN
  Filled 2019-12-15: qty 1

## 2019-12-15 MED ORDER — PREDNISONE 20 MG PO TABS: 60.0000 mg | ORAL_TABLET | Freq: Every day | ORAL | 0 refills | Status: DC

## 2019-12-15 MED ORDER — ENOXAPARIN SODIUM 80 MG/0.8ML ~~LOC~~ SOLN
0.5000 mg/kg | SUBCUTANEOUS | Status: DC
  Administered 2019-12-15 – 2019-12-16 (×2): 72.5 mg via SUBCUTANEOUS
  Filled 2019-12-15 (×4): qty 0.8

## 2019-12-15 MED ORDER — HYDROCHLOROTHIAZIDE 25 MG PO TABS
25.0000 mg | ORAL_TABLET | Freq: Every day | ORAL | Status: DC
  Administered 2019-12-15 – 2019-12-17 (×3): 25 mg via ORAL
  Filled 2019-12-15 (×3): qty 1

## 2019-12-15 MED ORDER — ACETAMINOPHEN 500 MG PO TABS
1000.0000 mg | ORAL_TABLET | Freq: Three times a day (TID) | ORAL | Status: DC | PRN
  Administered 2019-12-15: 23:00:00 1000 mg via ORAL
  Filled 2019-12-15: qty 2

## 2019-12-15 MED ORDER — POLYETHYLENE GLYCOL 3350 17 G PO PACK: 17.0000 g | PACK | Freq: Two times a day (BID) | ORAL | Status: DC | PRN

## 2019-12-15 MED ORDER — ALUM & MAG HYDROXIDE-SIMETH 200-200-20 MG/5ML PO SUSP: 15.0000 mL | Freq: Four times a day (QID) | ORAL | Status: DC | PRN

## 2019-12-15 MED ORDER — ALBUTEROL SULFATE (2.5 MG/3ML) 0.083% IN NEBU
5.0000 mg | INHALATION_SOLUTION | Freq: Once | RESPIRATORY_TRACT | Status: AC
  Administered 2019-12-15: 5 mg via RESPIRATORY_TRACT
  Filled 2019-12-15: qty 6

## 2019-12-15 MED ORDER — PREDNISONE 20 MG PO TABS
60.0000 mg | ORAL_TABLET | Freq: Once | ORAL | Status: AC
  Administered 2019-12-15: 60 mg via ORAL
  Filled 2019-12-15: qty 3

## 2019-12-15 MED ORDER — IPRATROPIUM-ALBUTEROL 0.5-2.5 (3) MG/3ML IN SOLN
9.0000 mL | Freq: Four times a day (QID) | RESPIRATORY_TRACT | Status: DC
  Administered 2019-12-15 – 2019-12-17 (×7): 9 mL via RESPIRATORY_TRACT
  Filled 2019-12-15 (×4): qty 3
  Filled 2019-12-15: qty 9
  Filled 2019-12-15: qty 3
  Filled 2019-12-15: qty 15
  Filled 2019-12-15: qty 3

## 2019-12-15 MED ORDER — ALBUTEROL SULFATE HFA 108 (90 BASE) MCG/ACT IN AERS: 2.0000 | INHALATION_SPRAY | Freq: Four times a day (QID) | RESPIRATORY_TRACT | 0 refills | Status: DC | PRN

## 2019-12-15 MED ORDER — IPRATROPIUM-ALBUTEROL 0.5-2.5 (3) MG/3ML IN SOLN
9.0000 mL | Freq: Once | RESPIRATORY_TRACT | Status: AC
  Administered 2019-12-15: 9 mL via RESPIRATORY_TRACT
  Filled 2019-12-15: qty 3

## 2019-12-15 MED ORDER — CALCIUM CARBONATE ANTACID 500 MG PO CHEW: 1.0000 | CHEWABLE_TABLET | Freq: Three times a day (TID) | ORAL | Status: DC | PRN

## 2019-12-15 MED ORDER — ONDANSETRON HCL 4 MG/2ML IJ SOLN: 4.0000 mg | Freq: Four times a day (QID) | INTRAMUSCULAR | Status: DC | PRN

## 2019-12-15 MED ORDER — ALBUTEROL SULFATE (2.5 MG/3ML) 0.083% IN NEBU
2.5000 mg | INHALATION_SOLUTION | Freq: Once | RESPIRATORY_TRACT | Status: AC
  Administered 2019-12-15: 2.5 mg via RESPIRATORY_TRACT

## 2019-12-15 MED ORDER — PREDNISONE 20 MG PO TABS
40.0000 mg | ORAL_TABLET | Freq: Every day | ORAL | Status: DC
  Administered 2019-12-16 – 2019-12-17 (×2): 40 mg via ORAL
  Filled 2019-12-15 (×2): qty 2

## 2019-12-15 NOTE — Progress Notes (Signed)
PHARMACIST - PHYSICIAN COMMUNICATION  CONCERNING:  Enoxaparin (Lovenox) for DVT Prophylaxis    RECOMMENDATION: Patient was prescribed enoxaprin 40mg  q24 hours for VTE prophylaxis.   Filed Weights   12/15/19 1327  Weight: (!) 145.2 kg (320 lb)    Body mass index is 56.69 kg/m.  Estimated Creatinine Clearance: 144 mL/min (by C-G formula based on SCr of 0.75 mg/dL).  Based on [facility] policy patient is candidate for enoxaparin 0.5mg /kg TBW SQ every 24 hours based on BMI being >30.  DESCRIPTION: Pharmacy has adjusted enoxaparin dose per [facility] policy.  Patient is now receiving enoxaparin 72.5 mg every 24 hours   [facility], PharmD Pharmacy Resident  12/15/2019 5:07 PM

## 2019-12-15 NOTE — H&P (Signed)
History and Physical    Kelsey Mcclure TOI:[id number] DOB: 1990-09-23 DOA: 12/15/2019  PCP: Patient, No Pcp Per  Patient coming from: home  I have personally briefly reviewed patient's old medical records in [facility]  Chief Complaint: shortness of breath and chest tightness  HPI: Kelsey Mcclure is a 29 y.o. female with medical history significant of hypertension, diabetes mellitus, asthma, depression, eczema, morbid obesity, who presented with shortness breath.   Pt reported dyspnea started Thursday (3 days ago) and this morning, pt woke up feeling like something was sitting on her chest.  Pt admitted to wheezing, and albuterol neb at home didn't help.  Minimum cough, no fever.  No obvious trigger except maybe seasonal change.  Pt stopped smoking 7-8 months ago.  No chest pain, abdominal pain, N/V/D, dysuria, increased swelling.  Pt is vaccinated against COVID.    Of note, pt was recently discharged on 11/15/19 for asthma exacerbation, and was discharged on prednisone taper and Symbicort which pt said she couldn't afford.   ED Course: initial vitals: afebrile, pulse 90, BP 135/110, RR 24, sating 88-89% on room air.  Labs notable for normal WBC, CXR showed "Prominent bilateral perihilar interstitial markings, similar to prior, which may reflect reactive airways disease or viral infection."  COVID and flu neg.  Pt received nebs and prednisone 60 mg in the ED but still had O2 desat, so was admitted.   Assessment/Plan Active Problems:   Acute respiratory failure with hypoxia (HCC)  Acute hypoxic respiratory failure 2/2 Acute asthma exacerbation --desat down to 88-89% on room air despite neb treatments in the ED.  COVID neg, no signs or symptoms of bacterial PNA.   PLAN: --cont steroid as prednisone 40 mg daily --DuoNeb QID --Continue supplemental O2 to keep sats >=92%, wean as tolerated   Hx of DM2 not on insulin --not on antiglycemics at home --Monitor BG with morning  labs for now. --outpatient followup  HTN --cont home HCTZ  Morbid obesity BMI 56 --Life style modification    DVT prophylaxis: Lovenox SQ Code Status: Full code  Family Communication:   Disposition Plan: home  Consults called: none Admission status: Inpatient   Review of Systems: As per HPI otherwise complete review of systems negative.   Past Medical History:  Diagnosis Date  . Asthma   . Diabetes mellitus without complication (HCC)   . Eczema     Past Surgical History:  Procedure Laterality Date  . CATARACT EXTRACTION, BILATERAL       reports that she quit smoking about 7 months ago. Her smoking use included cigarettes. She smoked 0.50 packs per day. She has never used smokeless tobacco. She reports current alcohol use of about 4.0 standard drinks of alcohol per week. She reports current drug use. Drug: Marijuana.  Allergies  Allergen Reactions  . Bee Venom Anaphylaxis  . Peanut Oil Anaphylaxis  . Shellfish Allergy Hives    Family History  Problem Relation Age of Onset  . Eczema Mother   . Diabetes Father   . Psoriasis Father   . Eczema Father   . Sleep apnea Father   . Asthma Sister   . Eczema Sister   . Eczema Sister      Prior to Admission medications   Medication Sig Start Date End Date Taking? Authorizing Provider  albuterol (VENTOLIN HFA) 108 (90 Base) MCG/ACT inhaler Inhale 2 puffs into the lungs every 6 (six) hours as needed for wheezing or shortness of breath. 12/15/19   Chesley Noon, MD  budesonide-formoterol (SYMBICORT) 160-4.5 MCG/ACT inhaler Inhale 2 puffs into the lungs in the morning and at bedtime. Patient not taking: Reported on 11/14/2019 08/27/19   Trey Sailors, PA-C  dextromethorphan-guaiFENesin [facility] DM) 30-600 MG 12hr tablet Take 1 tablet by mouth 2 (two) times daily as needed for cough. 11/15/19   Rolly Salter, MD  EPINEPHrine (EPIPEN 2-PAK) 0.3 mg/0.3 mL IJ SOAJ injection Inject 0.3 mg into the muscle as needed for  anaphylaxis.    [provider]  halobetasol (ULTRAVATE) 0.05 % cream Apply 1 application topically 2 (two) times daily. Apply topically twice daily after showers    [provider]  hydrochlorothiazide (HYDRODIURIL) 25 MG tablet Take 1 tablet (25 mg total) by mouth daily. 11/15/19   Rolly Salter, MD  montelukast (SINGULAIR) 10 MG tablet Take 10 mg by mouth at bedtime.    [provider]  naproxen sodium (ALEVE) 220 MG tablet Take 440 mg by mouth. Take 2 tabs (440 mg total) by mouth as needed for pain.    [provider]  predniSONE (DELTASONE) 20 MG tablet Take 3 tablets (60 mg total) by mouth daily for 5 days. 12/15/19 12/20/19  Chesley Noon, MD    Physical Exam: Vitals:   12/15/19 1345 12/15/19 1421 12/15/19 1521 12/15/19 1605  BP:   (!) 137/100 (!) 144/94  Pulse: 93 79 92 95  Resp: (!) 22 13 (!) 22 (!) 24  Temp:      TempSrc:      SpO2: 97% 98% 100% 98%  Weight:      Height:        Constitutional: NAD, AAOx3  HEENT: conjunctivae and lids normal, EOMI CV: RRR no M,R,G. Distal pulses +2.  No cyanosis.   RESP: diffuse wheezes, normal respiratory effort, on 2L GI: +BS, NTND Extremities: No effusions, edema, or tenderness in BLE SKIN: warm, dry and intact Neuro: II - XII grossly intact.  Sensation intact Psych: Normal mood and affect.  Appropriate judgement and reason   Labs on Admission: I have personally reviewed following labs and imaging studies  CBC: Recent Labs  Lab 12/15/19 1506  WBC 10.0  NEUTROABS 5.7  HGB 11.8*  HCT 40.0  MCV 83.3  PLT 340   Basic Metabolic Panel: Recent Labs  Lab 12/15/19 1506  NA 138  K 3.8  CL 97*  CO2 34*  GLUCOSE 143*  BUN 12  CREATININE 0.75  CALCIUM 8.5*   GFR: Estimated Creatinine Clearance: 144 mL/min (by C-G formula based on SCr of 0.75 mg/dL). Liver Function Tests: Recent Labs  Lab 12/15/19 1506  AST 18  ALT 22  ALKPHOS 63  BILITOT 0.5  PROT 7.4  ALBUMIN 3.5   No  results for input(s): LIPASE, AMYLASE in the last 168 hours. No results for input(s): AMMONIA in the last 168 hours. Coagulation Profile: No results for input(s): INR, PROTIME in the last 168 hours. Cardiac Enzymes: No results for input(s): CKTOTAL, CKMB, CKMBINDEX, TROPONINI in the last 168 hours. BNP (last 3 results) No results for input(s): PROBNP in the last 8760 hours. HbA1C: No results for input(s): HGBA1C in the last 72 hours. CBG: No results for input(s): GLUCAP in the last 168 hours. Lipid Profile: No results for input(s): CHOL, HDL, LDLCALC, TRIG, CHOLHDL, LDLDIRECT in the last 72 hours. Thyroid Function Tests: No results for input(s): TSH, T4TOTAL, FREET4, T3FREE, THYROIDAB in the last 72 hours. Anemia Panel: No results for input(s): VITAMINB12, FOLATE, FERRITIN, TIBC, IRON, RETICCTPCT in the last 72  hours. Urine analysis: No results found for: COLORURINE, APPEARANCEUR, LABSPEC, PHURINE, GLUCOSEU, HGBUR, BILIRUBINUR, KETONESUR, PROTEINUR, UROBILINOGEN, NITRITE, LEUKOCYTESUR  Radiological Exams on Admission: DG Chest 2 View  Result Date: 12/15/2019 CLINICAL DATA:  Wheezing, shortness of breath EXAM: CHEST - 2 VIEW COMPARISON:  11/14/2019 FINDINGS: The heart size and mediastinal contours are within normal limits. Prominent bilateral perihilar interstitial markings is similar to prior. No focal airspace consolidation. No pleural effusion or pneumothorax. The visualized skeletal structures are unremarkable. IMPRESSION: Prominent bilateral perihilar interstitial markings, similar to prior, which may reflect reactive airways disease or viral infection. Electronically Signed   By: Duanne Guess D.O.   On: 12/15/2019 14:26      Darlin Priestly MD [facility]  If 7PM-7AM, please contact night-coverage 12/15/2019, 4:53 PM

## 2019-12-15 NOTE — ED Notes (Signed)
Transport team called for transfer to floor.

## 2019-12-15 NOTE — ED Notes (Signed)
Pt placed on 2 liters oxygen via [place]. First nurse aware pts SpO2 89% on room air.

## 2019-12-15 NOTE — ED Notes (Addendum)
Note written on wrong pt.

## 2019-12-15 NOTE — ED Notes (Signed)
Pt up to bathroom.

## 2019-12-15 NOTE — ED Notes (Signed)
Advised nurse that patient has assigned bed 

## 2019-12-15 NOTE — ED Notes (Signed)
Transport still on way.

## 2019-12-15 NOTE — ED Notes (Signed)
Pt was not on [place] oxygen, oxygen sats dropped to 88. Pt placed on 2L oxygen per [place], SPO2 now 98-100.

## 2019-12-15 NOTE — ED Triage Notes (Signed)
Pt to ED via POV. Pt states that she has hx/o Asthma. Pt has been having an asthma flare since Tuesday. Pt states that she has been using her albuterol inhaler and nebulizer but it is not helping. Pt has not been able to afford her Symbicort inhaler so she has not used it in a few months. Pt also states that she has had a migraine for the last week. Pt is in NAD at this time.

## 2019-12-15 NOTE — ED Provider Notes (Signed)
-----------------------------------------   4:19 PM on 12/15/2019 -----------------------------------------  Patient care assumed from Dr. Larinda Buttery.  I have personally seen and evaluated the patient.  Unfortunately after her last DuoNeb within 30 minutes she is now satting in the mid 80s once again, currently 83% during my evaluation.  Given her persistent hypoxia when off oxygen or nebulizer treatment we will admit to the hospital service for continued treatment and work-up.  We will obtain a Covid swab.  I have ordered albuterol for the patient.  Patient agreeable plan of care.   Minna Antis, MD 12/15/19 1620

## 2019-12-15 NOTE — ED Notes (Signed)
Floor RN called, another pt was put in this pt's room and now they need to clean a different room before pt can be brought up. She will call back to let us know. It will be room 126 or 117.

## 2019-12-15 NOTE — ED Provider Notes (Signed)
[facility] Emergency Department Provider Note   ____________________________________________   First MD Initiated Contact with Patient 12/15/19 1351     (approximate)  I have reviewed the triage vital signs and the nursing notes.   HISTORY  Chief Complaint Wheezing and Migraine    HPI Kelsey Mcclure is a 29 y.o. female with past medical history of asthma, diabetes, and hypertension who presents to the ED complaining of headache and shortness of breath. Patient reports that about 9 days ago she developed throbbing pain affecting the right side of her head similar to previous migraines. It is exacerbated by bright lights, but she denies any nausea or vomiting. She has not had any fevers and denies any neck stiffness. She has also had increasing difficulty breathing over the past 3 to 4 days with a nonproductive cough and tightness in her chest. She eventually sought care in the ED because of more severe chest tightness occurring this morning. She states her albuterol inhaler and nebulizer were not working at home, so she has been unable to treat her asthma.        Past Medical History:  Diagnosis Date   Asthma    Diabetes mellitus without complication (HCC)    Eczema     Patient Active Problem List   Diagnosis Date Noted   Type 2 diabetes mellitus with hyperglycemia (HCC) 10/26/2018   Morbid obesity (HCC) 10/26/2018   Depression 10/26/2018   Asthma exacerbation 10/26/2018   Elevated BP without diagnosis of hypertension 10/26/2018   Posterior subcapsular polar cataract, nonsenile 12/08/2017    Past Surgical History:  Procedure Laterality Date   CATARACT EXTRACTION, BILATERAL      Prior to Admission medications   Medication Sig Start Date End Date Taking? Authorizing Provider  albuterol (VENTOLIN HFA) 108 (90 Base) MCG/ACT inhaler Inhale 2 puffs into the lungs every 6 (six) hours as needed for wheezing or shortness of breath.  12/15/19   Chesley Noon, MD  budesonide-formoterol [address]) 160-4.5 MCG/ACT inhaler Inhale 2 puffs into the lungs in the morning and at bedtime. Patient not taking: Reported on 11/14/2019 08/27/19   Trey Sailors, PA-C  dextromethorphan-guaiFENesin [facility] DM) 30-600 MG 12hr tablet Take 1 tablet by mouth 2 (two) times daily as needed for cough. 11/15/19   Rolly Salter, MD  EPINEPHrine (EPIPEN 2-PAK) 0.3 mg/0.3 mL IJ SOAJ injection Inject 0.3 mg into the muscle as needed for anaphylaxis.    [provider]  halobetasol (ULTRAVATE) 0.05 % cream Apply 1 application topically 2 (two) times daily. Apply topically twice daily after showers    [provider]  hydrochlorothiazide (HYDRODIURIL) 25 MG tablet Take 1 tablet (25 mg total) by mouth daily. 11/15/19   Rolly Salter, MD  montelukast (SINGULAIR) 10 MG tablet Take 10 mg by mouth at bedtime.    [provider]  naproxen sodium (ALEVE) 220 MG tablet Take 440 mg by mouth. Take 2 tabs (440 mg total) by mouth as needed for pain.    [provider]  predniSONE (DELTASONE) 20 MG tablet Take 3 tablets (60 mg total) by mouth daily for 5 days. 12/15/19 12/20/19  Chesley Noon, MD    Allergies Bee venom, Peanut oil, and Shellfish allergy  Family History  Problem Relation Age of Onset   Eczema Mother    Diabetes Father    Psoriasis Father    Eczema Father    Sleep apnea Father    Asthma Sister    Eczema Sister  Eczema Sister     Social History Social History   Tobacco Use   Smoking status: Former Smoker    Packs/day: 0.50    Types: Cigarettes    Quit date: 04/16/2019    Years since quitting: 0.6   Smokeless tobacco: Never Used  Vaping Use   Vaping Use: Never used  Substance Use Topics   Alcohol use: Yes    Alcohol/week: 4.0 standard drinks    Types: 4 Shots of liquor per week   Drug use: Yes    Types: Marijuana    Review of Systems  Constitutional: No  fever/chills Eyes: No visual changes. ENT: No sore throat. Cardiovascular: Denies chest pain. Respiratory: Positive for cough and shortness of breath. Gastrointestinal: No abdominal pain.  No nausea, no vomiting.  No diarrhea.  No constipation. Genitourinary: Negative for dysuria. Musculoskeletal: Negative for back pain. Skin: Negative for rash. Neurological: Positive for headaches, negative for focal weakness or numbness.  ____________________________________________   PHYSICAL EXAM:  VITAL SIGNS: ED Triage Vitals  Enc Vitals Group     BP 12/15/19 1323 (!) 135/110     Pulse Rate 12/15/19 1323 90     Resp 12/15/19 1323 (!) 24     Temp 12/15/19 1323 98.2 F (36.8 C)     Temp Source 12/15/19 1323 Oral     SpO2 12/15/19 1323 (!) 89 %     Weight 12/15/19 1327 (!) 320 lb (145.2 kg)     Height 12/15/19 1327 5\' 3"  (1.6 m)     Head Circumference --      Peak Flow --      Pain Score 12/15/19 1327 8     Pain Loc --      Pain Edu? --      Excl. in GC? --     Constitutional: Alert and oriented. Eyes: Conjunctivae are normal. Head: Atraumatic. Nose: No congestion/rhinnorhea. Mouth/Throat: Mucous membranes are moist. Neck: Normal ROM Cardiovascular: Normal rate, regular rhythm. Grossly normal heart sounds. Respiratory: Tachypneic with increased respiratory effort.  No retractions. Lungs with inspiratory and expiratory wheezes throughout. Gastrointestinal: Soft and nontender. No distention. Genitourinary: deferred Musculoskeletal: No lower extremity tenderness nor edema. Neurologic:  Normal speech and language. No gross focal neurologic deficits are appreciated. Skin:  Skin is warm, dry and intact. No rash noted. Psychiatric: Mood and affect are normal. Speech and behavior are normal.  ____________________________________________   LABS (all labs ordered are listed, but only abnormal results are displayed)  Labs Reviewed  CBC WITH DIFFERENTIAL/PLATELET  COMPREHENSIVE  METABOLIC PANEL  HCG, QUANTITATIVE, PREGNANCY  POC URINE PREG, ED   ____________________________________________  EKG  ED ECG REPORT I, 12/17/19, the attending physician, personally viewed and interpreted this ECG.   Date: 12/15/2019  EKG Time: 13:30  Rate: 87  Rhythm: normal sinus rhythm  Axis: Normal  Intervals:none  ST&T Change: None   PROCEDURES  Procedure(s) performed (including Critical Care):  Procedures   ____________________________________________   INITIAL IMPRESSION / ASSESSMENT AND PLAN / ED COURSE       29 year old female with past medical history of hypertension, asthma, diabetes who presents to the ED complaining of 9 days of right-sided headache along with cough and increasing difficulty breathing with chest tightness for the past 3 days. Patient noted to have O2 sats of 89% on room air in triage, subsequently placed on 2 L nasal cannula. She is not in any respiratory distress on my evaluation, but is tachypneic with some increased work of breathing and wheezing  throughout. We will treat with duo nebs and p.o. prednisone. She reports chest tightness but low suspicion for ACS given atypical symptoms with unremarkable EKG. Chest x-ray reviewed by me and shows no infiltrate, edema, or effusion. Low suspicion for PE. Headache sounds consistent with migraine versus tension headache, doubt meningitis or SAH. We will reassess following duo nebs and steroids, attempt to wean nasal cannula.  Patient reports feeling better following DuoNeb's and steroids, on reevaluation she is maintaining O2 sats on room air and tachypnea has resolved.  She does continue to have some expiratory wheezing and we will give an additional albuterol neb.  Chest x-ray negative for acute process per radiology.  Lab work is pending at this time and patient will be turned over to oncoming provider pending lab results and reassessment.       ____________________________________________   FINAL CLINICAL IMPRESSION(S) / ED DIAGNOSES  Final diagnoses:  Exacerbation of asthma, unspecified asthma severity, unspecified whether persistent  Migraine without status migrainosus, not intractable, unspecified migraine type     ED Discharge Orders         Ordered    albuterol (VENTOLIN HFA) 108 (90 Base) MCG/ACT inhaler  Every 6 hours PRN       Note to Pharmacy: Please supply with spacer   12/15/19 1456    predniSONE (DELTASONE) 20 MG tablet  Daily        12/15/19 1456           Note:  This document was prepared using Dragon voice recognition software and may include unintentional dictation errors.   Chesley Noon, MD 12/15/19 1500

## 2019-12-15 NOTE — ED Notes (Signed)
Transport team called, pt will go to room 126.

## 2019-12-15 NOTE — ED Notes (Signed)
Pt states started having asthma exascerbation 3d ago. Bilateral expiratory wheezes heard all lung fields. SPO2 currently 100% on 2.5L oxygen per [place].

## 2019-12-16 DIAGNOSIS — J45901 Unspecified asthma with (acute) exacerbation: Secondary | ICD-10-CM

## 2019-12-16 LAB — CBC
HCT: 40.9 % (ref 36.0–46.0)
Hemoglobin: 12.6 g/dL (ref 12.0–15.0)
MCH: 25 pg — ABNORMAL LOW (ref 26.0–34.0)
MCHC: 30.8 g/dL (ref 30.0–36.0)
MCV: 81.2 fL (ref 80.0–100.0)
Platelets: 370 10*3/uL (ref 150–400)
RBC: 5.04 MIL/uL (ref 3.87–5.11)
RDW: 15.9 % — ABNORMAL HIGH (ref 11.5–15.5)
WBC: 12.9 10*3/uL — ABNORMAL HIGH (ref 4.0–10.5)
nRBC: 0 % (ref 0.0–0.2)

## 2019-12-16 LAB — BASIC METABOLIC PANEL
Anion gap: 10 (ref 5–15)
BUN: 11 mg/dL (ref 6–20)
CO2: 33 mmol/L — ABNORMAL HIGH (ref 22–32)
Calcium: 9.1 mg/dL (ref 8.9–10.3)
Chloride: 96 mmol/L — ABNORMAL LOW (ref 98–111)
Creatinine, Ser: 0.7 mg/dL (ref 0.44–1.00)
GFR, Estimated: >60 mL/min (ref >60)
Glucose, Bld: 142 mg/dL — ABNORMAL HIGH (ref 70–99)
Potassium: 3.9 mmol/L (ref 3.5–5.1)
Sodium: 139 mmol/L (ref 135–145)

## 2019-12-16 LAB — MAGNESIUM: Magnesium: 2.1 mg/dL (ref 1.7–2.4)

## 2019-12-16 NOTE — TOC Initial Note (Signed)
Transition of Care [facility]) - Initial/Assessment Note    Patient Details  Name: Kelsey Mcclure MRN: [id number] Date of Birth: 21-Jul-1990  Transition of Care [facility]) CM/SW Contact:    Liliana Cline, LCSW Phone Number: 12/16/2019, 10:53 AM  Clinical Narrative:       CSW consulted for medication assistance. CSW spoke with patient. Patient lives with her Mother who provides transportation to appointments. If her Mother is unavailable, she uses [place]. Patient typically uses Psychologist, forensic and [employer] coupons. No PCP. Patient has  Nebulizer at home. Patient confirmed she has an [facility] Management application which was provided by TOC during last hospitalization. Patient reported she plans to follow up with [facility] to use them as her PCP until she gets insurance and would like her medications e scribed to Medication Management at discharge. She denied additional needs prior to discharge.            Expected Discharge Plan: Home/Self Care Barriers to Discharge: Continued Medical Work up   Patient Goals and CMS Choice Patient states their goals for this hospitalization and ongoing recovery are:: to return home CMS Medicare.gov Compare Post Acute Care list provided to:: Patient Choice offered to / list presented to : Patient  Expected Discharge Plan and Services Expected Discharge Plan: Home/Self Care       Living arrangements for the past 2 months: Single Family Home                                      Prior Living Arrangements/Services Living arrangements for the past 2 months: Single Family Home Lives with:: Parents Patient language and need for interpreter reviewed:: Yes Do you feel safe going back to the place where you live?: Yes      Need for Family Participation in Patient Care: Yes (Comment) Care giver support system in place?: Yes (comment)   Criminal Activity/Legal Involvement Pertinent to Current Situation/Hospitalization: No - Comment as  needed  Activities of Daily Living Home Assistive Devices/Equipment: None ADL Screening (condition at time of admission) Patient's cognitive ability adequate to safely complete daily activities?: Yes Is the patient deaf or have difficulty hearing?: No Does the patient have difficulty seeing, even when wearing glasses/contacts?: No Does the patient have difficulty concentrating, remembering, or making decisions?: No Patient able to express need for assistance with ADLs?: Yes Does the patient have difficulty dressing or bathing?: No Independently performs ADLs?: Yes (appropriate for developmental age) Does the patient have difficulty walking or climbing stairs?: No Weakness of Legs: None Weakness of Arms/Hands: None  Permission Sought/Granted   Permission granted to share information with : Yes, Verbal Permission Granted     Permission granted to share info w AGENCY: [facility] and [facility]        Emotional Assessment       Orientation: : Oriented to Self, Oriented to Place, Oriented to  Time, Oriented to Situation Alcohol / Substance Use: Not Applicable Psych Involvement: No (comment)  Admission diagnosis:  Acute respiratory failure with hypoxia (HCC) [J96.01] Migraine without status migrainosus, not intractable, unspecified migraine type [G43.909] Exacerbation of asthma, unspecified asthma severity, unspecified whether persistent [J45.901] Patient Active Problem List   Diagnosis Date Noted  . Acute respiratory failure with hypoxia (HCC) 12/15/2019  . Type 2 diabetes mellitus with hyperglycemia (HCC) 10/26/2018  . Morbid obesity (HCC) 10/26/2018  . Depression 10/26/2018  . Asthma exacerbation  10/26/2018  . Elevated BP without diagnosis of hypertension 10/26/2018  . Posterior subcapsular polar cataract, nonsenile 12/08/2017   PCP:  Patient, No Pcp Per Pharmacy:   [facility] [address]), [place] - [address] [place]) [place] [address] Phone: [phone] Fax: [phone]     Social Determinants of Health (SDOH) Interventions    Readmission Risk Interventions No flowsheet data found.

## 2019-12-16 NOTE — Progress Notes (Signed)
Dr Chipper Herb aware that pt desat to 85% with exertion, recovers well, per Dr Chipper Herb pt will not discharge today, will follow up tomorrow

## 2019-12-16 NOTE — Progress Notes (Signed)
PROGRESS NOTE    Kelsey Mcclure  LYY:[id number] DOB: [birth year] DOA: 12/15/2019 PCP: Patient, No Pcp Per   Chief complaint.  Shortness of breath. Brief Narrative:  Kelsey Mcclure is a 29 y.o. female with medical history significant of hypertension, diabetes mellitus, asthma, depression, eczema, morbid obesity, who presented with shortness breath.  She has negative Covid test and the flu.  Chest x-ray showed bilateral perihilar interstitial markings.  She is with Asthma exacerbation, she was started on steroids.  She also has significant desaturation, was placed on oxygen.   Assessment & Plan:   Active Problems:   Acute respiratory failure with hypoxia (HCC)  #1.  Acute hypoxemic respiratory failure. Acute asthma exacerbation. Patient hypoxemia is better at room air, but still drop oxygen with exertion. We will continue steroids, continue DuoNeb 4 times a day. Patient will benefit for another day of treatment in the hospital.  2.  Type 2 diabetes. Continue to follow glucose.  3.  Essential hypertension. On HCTZ.  4.  Morbid obesity.      DVT prophylaxis: Lovenox Code Status: Full Family Communication: None Disposition Plan:  .   Status is: Inpatient  Remains inpatient appropriate because:Inpatient level of care appropriate due to severity of illness   Dispo: The patient is from: Home              Anticipated d/c is to: Home              Anticipated d/c date is: 1 day              Patient currently is not medically stable to d/c.        No intake/output data recorded. No intake/output data recorded.     Consultants:   None  Procedures: None  Antimicrobials:None  Subjective: Patient feels better this morning, but still desats with exertion.  Short of breath with exertion.  Cough, nonproductive pain No fever chills. No abdominal pain no nausea vomiting. No diarrhea constipation pain No chest pain or palpitation. No dysuria  hematuria.  Objective: Vitals:   12/15/19 2038 12/16/19 0037 12/16/19 0507 12/16/19 0932  BP:  131/83 (!) 125/98 126/87  Pulse:  88 82 99  Resp:  (!) 22 20 18   Temp:  98.2 F (36.8 C) 98.2 F (36.8 C) 98.5 F (36.9 C)  TempSrc:  Oral Oral Oral  SpO2: 100% 92% 91% (!) 89%  Weight:      Height:       No intake or output data in the 24 hours ending 12/16/19 0938 Filed Weights   12/15/19 1327 12/15/19 2005  Weight: (!) 145.2 kg (!) 157 kg    Examination:  General exam: Appears calm and comfortable, morbid obese Respiratory system: Decreased breathing sounds with mild wheezes.  Respiratory effort normal. Cardiovascular system: S1 & S2 heard, RRR. No JVD, murmurs, rubs, gallops or clicks. No pedal edema. Gastrointestinal system: Abdomen is nondistended, soft and nontender. No organomegaly or masses felt. Normal bowel sounds heard. Central nervous system: Alert and oriented. No focal neurological deficits. Extremities: Symmetric Skin: No rashes, lesions or ulcers Psychiatry: Mood & affect appropriate.     Data Reviewed: I have personally reviewed following labs and imaging studies  CBC: Recent Labs  Lab 12/15/19 1506 12/16/19 0521  WBC 10.0 12.9*  NEUTROABS 5.7  --   HGB 11.8* 12.6  HCT 40.0 40.9  MCV 83.3 81.2  PLT 340 370   Basic Metabolic Panel: Recent Labs  Lab 12/15/19 1506 12/16/19 0521  NA 138 139  K 3.8 3.9  CL 97* 96*  CO2 34* 33*  GLUCOSE 143* 142*  BUN 12 11  CREATININE 0.75 0.70  CALCIUM 8.5* 9.1  MG  --  2.1   GFR: Estimated Creatinine Clearance: 152.8 mL/min (by C-G formula based on SCr of 0.7 mg/dL). Liver Function Tests: Recent Labs  Lab 12/15/19 1506  AST 18  ALT 22  ALKPHOS 63  BILITOT 0.5  PROT 7.4  ALBUMIN 3.5   No results for input(s): LIPASE, AMYLASE in the last 168 hours. No results for input(s): AMMONIA in the last 168 hours. Coagulation Profile: No results for input(s): INR, PROTIME in the last 168 hours. Cardiac  Enzymes: No results for input(s): CKTOTAL, CKMB, CKMBINDEX, TROPONINI in the last 168 hours. BNP (last 3 results) No results for input(s): PROBNP in the last 8760 hours. HbA1C: No results for input(s): HGBA1C in the last 72 hours. CBG: No results for input(s): GLUCAP in the last 168 hours. Lipid Profile: No results for input(s): CHOL, HDL, LDLCALC, TRIG, CHOLHDL, LDLDIRECT in the last 72 hours. Thyroid Function Tests: No results for input(s): TSH, T4TOTAL, FREET4, T3FREE, THYROIDAB in the last 72 hours. Anemia Panel: No results for input(s): VITAMINB12, FOLATE, FERRITIN, TIBC, IRON, RETICCTPCT in the last 72 hours. Sepsis Labs: No results for input(s): PROCALCITON, LATICACIDVEN in the last 168 hours.  Recent Results (from the past 240 hour(s))  Resp Panel by RT-PCR (Flu A&B, Covid) Nasopharyngeal Swab     Status: None   Collection Time: 12/15/19  4:10 PM   Specimen: Nasopharyngeal Swab; Nasopharyngeal(NP) swabs in vial transport medium  Result Value Ref Range Status   SARS Coronavirus 2 by RT PCR NEGATIVE NEGATIVE Final    Comment: (NOTE) SARS-CoV-2 target nucleic acids are NOT DETECTED.  The SARS-CoV-2 RNA is generally detectable in upper respiratory specimens during the acute phase of infection. The lowest concentration of SARS-CoV-2 viral copies this assay can detect is 138 copies/mL. A negative result does not preclude SARS-Cov-2 infection and should not be used as the sole basis for treatment or other patient management decisions. A negative result may occur with  improper specimen collection/handling, submission of specimen other than nasopharyngeal swab, presence of viral mutation(s) within the areas targeted by this assay, and inadequate number of viral copies(<138 copies/mL). A negative result must be combined with clinical observations, patient history, and epidemiological information. The expected result is Negative.  Fact Sheet for Patients:   BloggerCourse.com  Fact Sheet for Healthcare Providers:  SeriousBroker.it  This test is no t yet approved or cleared by the [place] FDA and  has been authorized for detection and/or diagnosis of SARS-CoV-2 by FDA under an Emergency Use Authorization (EUA). This EUA will remain  in effect (meaning this test can be used) for the duration of the COVID-19 declaration under Section 564(b)(1) of the Act, 21 U.S.C.section 360bbb-3(b)(1), unless the authorization is terminated  or revoked sooner.       Influenza A by PCR NEGATIVE NEGATIVE Final   Influenza B by PCR NEGATIVE NEGATIVE Final    Comment: (NOTE) The Xpert Xpress SARS-CoV-2/FLU/RSV plus assay is intended as an aid in the diagnosis of influenza from Nasopharyngeal swab specimens and should not be used as a sole basis for treatment. Nasal washings and aspirates are unacceptable for Xpert Xpress SARS-CoV-2/FLU/RSV testing.  Fact Sheet for Patients: BloggerCourse.com  Fact Sheet for Healthcare Providers: SeriousBroker.it  This test is not yet approved or cleared by the [place] FDA and has  been authorized for detection and/or diagnosis of SARS-CoV-2 by FDA under an Emergency Use Authorization (EUA). This EUA will remain in effect (meaning this test can be used) for the duration of the COVID-19 declaration under Section 564(b)(1) of the Act, 21 U.S.C. section 360bbb-3(b)(1), unless the authorization is terminated or revoked.  Performed at [facility], [address], [place], [place] [address]          Radiology Studies: DG Chest 2 View  Result Date: 12/15/2019 CLINICAL DATA:  Wheezing, shortness of breath EXAM: CHEST - 2 VIEW COMPARISON:  11/14/2019 FINDINGS: The heart size and mediastinal contours are within normal limits. Prominent bilateral perihilar interstitial markings is similar to prior.  No focal airspace consolidation. No pleural effusion or pneumothorax. The visualized skeletal structures are unremarkable. IMPRESSION: Prominent bilateral perihilar interstitial markings, similar to prior, which may reflect reactive airways disease or viral infection. Electronically Signed   By: Duanne Guess D.O.   On: 12/15/2019 14:26        Scheduled Meds: . enoxaparin (LOVENOX) injection  0.5 mg/kg Subcutaneous Q24H  . hydrochlorothiazide  25 mg Oral Daily  . ipratropium-albuterol  9 mL Nebulization QID  . predniSONE  40 mg Oral Q breakfast   Continuous Infusions:   LOS: 1 day    Time spent: 27 minutes    Marrion Coy, MD [facility]   To contact the attending provider between 7A-7P or the covering provider during after hours 7P-7A, please log into the web site [email or web address] and access using universal [place] password for that web site. If you do not have the password, please call the hospital operator.  12/16/2019, 9:38 AM

## 2019-12-17 ENCOUNTER — Other Ambulatory Visit: Payer: Self-pay | Admitting: Internal Medicine

## 2019-12-17 LAB — CBC
HCT: 43.7 % (ref 36.0–46.0)
Hemoglobin: 13.5 g/dL (ref 12.0–15.0)
MCH: 24.9 pg — ABNORMAL LOW (ref 26.0–34.0)
MCHC: 30.9 g/dL (ref 30.0–36.0)
MCV: 80.6 fL (ref 80.0–100.0)
Platelets: 368 10*3/uL (ref 150–400)
RBC: 5.42 MIL/uL — ABNORMAL HIGH (ref 3.87–5.11)
RDW: 15.9 % — ABNORMAL HIGH (ref 11.5–15.5)
WBC: 12.5 10*3/uL — ABNORMAL HIGH (ref 4.0–10.5)
nRBC: 0 % (ref 0.0–0.2)

## 2019-12-17 LAB — BASIC METABOLIC PANEL
Anion gap: 9 (ref 5–15)
BUN: 13 mg/dL (ref 6–20)
CO2: 33 mmol/L — ABNORMAL HIGH (ref 22–32)
Calcium: 9.1 mg/dL (ref 8.9–10.3)
Chloride: 97 mmol/L — ABNORMAL LOW (ref 98–111)
Creatinine, Ser: 0.89 mg/dL (ref 0.44–1.00)
GFR, Estimated: >60 mL/min (ref >60)
Glucose, Bld: 138 mg/dL — ABNORMAL HIGH (ref 70–99)
Potassium: 4.1 mmol/L (ref 3.5–5.1)
Sodium: 139 mmol/L (ref 135–145)

## 2019-12-17 LAB — MAGNESIUM: Magnesium: 1.9 mg/dL (ref 1.7–2.4)

## 2019-12-17 MED ORDER — PREDNISONE 20 MG PO TABS: ORAL_TABLET | ORAL | 0 refills | Status: AC

## 2019-12-17 MED ORDER — ALBUTEROL SULFATE HFA 108 (90 BASE) MCG/ACT IN AERS: 2.0000 | INHALATION_SPRAY | Freq: Four times a day (QID) | RESPIRATORY_TRACT | 0 refills | Status: DC | PRN

## 2019-12-17 MED ORDER — ALBUTEROL SULFATE (2.5 MG/3ML) 0.083% IN NEBU: 2.5000 mg | INHALATION_SOLUTION | RESPIRATORY_TRACT | 0 refills | Status: DC | PRN

## 2019-12-17 MED ORDER — BUDESONIDE-FORMOTEROL FUMARATE 160-4.5 MCG/ACT IN AERO: 2.0000 | INHALATION_SPRAY | Freq: Two times a day (BID) | RESPIRATORY_TRACT | 0 refills | Status: DC

## 2019-12-17 NOTE — Progress Notes (Signed)
Patient refused wheelchair and volunteer escort to car

## 2019-12-17 NOTE — Progress Notes (Signed)
    To whom it may concern,   Ms. Kelsey Mcclure is admitted to the hospital on 11/21 to 11/23.  May return to work on 11/25.

## 2019-12-17 NOTE — TOC Progression Note (Signed)
Transition of Care [facility]) - Progression Note    Patient Details  Name: Kelsey Mcclure MRN: [id number] Date of Birth: 12/17/1990  Transition of Care [facility]) CM/SW Contact  Liliana Cline, LCSW Phone Number: 12/17/2019, 9:20 AM  Clinical Narrative:   In [employer] message sent to [facility] Representatives Hilbert Corrigan  and [place]) for follow up after patient is discharged. Home med pharmacy updated to Medication Management.    Expected Discharge Plan: Home/Self Care Barriers to Discharge: Continued Medical Work up  Expected Discharge Plan and Services Expected Discharge Plan: Home/Self Care       Living arrangements for the past 2 months: Single Family Home                                       Social Determinants of Health (SDOH) Interventions    Readmission Risk Interventions No flowsheet data found.

## 2019-12-17 NOTE — Progress Notes (Signed)
Pt being discharged home, discharge instructions reviewed with pt, states understanding, work note given, pt with no complaints, awaiting ride for transport home

## 2019-12-17 NOTE — Plan of Care (Signed)

## 2019-12-17 NOTE — Discharge Summary (Signed)
Physician Discharge Summary  Patient ID: Kelsey Mcclure MRN: [id number] DOB/AGE: Oct 16, 1990 29 y.o.  Admit date: 12/15/2019 Discharge date: 12/17/2019  Admission Diagnoses:  Discharge Diagnoses:  Active Problems:   Morbid obesity (HCC)   Asthma exacerbation   Acute respiratory failure with hypoxia [facility])   Discharged Condition: good  Hospital Course:  Kelsey Johnsonis a 29 y.o.femalewith medical history significant ofhypertension, diabetes mellitus, asthma, depression, eczema,morbidobesity, who presentedwith shortness breath.  She has negative Covid test and the flu.  Chest x-ray showed bilateral perihilar interstitial markings.  She is with Asthma exacerbation, she was started on steroids.  She also has significant desaturation, was placed on oxygen.  #1.  Acute hypoxemic respiratory failure. Acute asthma exacerbation. Patient treated with IV steroids and albuterol nebulizer.  Condition much improved.  She is off oxygen last night, she was able to walk with the nurse without desaturation today.  She is medically stable to be discharged.  She will be continued on steroid taper, albuterol as needed.   2.  Type 2 diabetes. Stable.  3.  Essential hypertension. On HCTZ.  4.  Morbid obesity.     Consults: None  Significant Diagnostic Studies:  CHEST - 2 VIEW  COMPARISON:  11/14/2019  FINDINGS: The heart size and mediastinal contours are within normal limits. Prominent bilateral perihilar interstitial markings is similar to prior. No focal airspace consolidation. No pleural effusion or pneumothorax. The visualized skeletal structures are unremarkable.  IMPRESSION: Prominent bilateral perihilar interstitial markings, similar to prior, which may reflect reactive airways disease or viral infection.   Electronically Signed   By: Duanne Guess D.O.   On: 12/15/2019 14:26   Treatments: IV steroids, nebulizer.  Discharge Exam: Blood pressure  (!) 136/93, pulse 92, temperature 97.7 F (36.5 C), resp. rate 18, height 5' 3.5" (1.613 m), weight (!) 157 kg, SpO2 93 %. General appearance: alert, cooperative and morbidly obese Resp: Decreased breathing sounds, no wheezes or crackles. Cardio: regular rate and rhythm, S1, S2 normal, no murmur, click, rub or gallop GI: soft, non-tender; bowel sounds normal; no masses,  no organomegaly Extremities: extremities normal, atraumatic, no cyanosis or edema  Disposition: Discharge disposition: 01-Home or Self Care       Discharge Instructions    Diet - low sodium heart healthy   Complete by: As directed    Increase activity slowly   Complete by: As directed      Allergies as of 12/17/2019      Reactions   Bee Venom Anaphylaxis   Peanut Oil Anaphylaxis   Shellfish Allergy Hives      Medication List    TAKE these medications   albuterol 108 (90 Base) MCG/ACT inhaler Commonly known as: VENTOLIN HFA Inhale 2 puffs into the lungs every 6 (six) hours as needed for wheezing or shortness of breath. What changed:   when to take this  Another medication with the same name was removed. Continue taking this medication, and follow the directions you see here.   budesonide-formoterol 160-4.5 MCG/ACT inhaler Commonly known as: Symbicort Inhale 2 puffs into the lungs in the morning and at bedtime.   dextromethorphan-guaiFENesin 30-600 MG 12hr tablet Commonly known as: MUCINEX DM Take 1 tablet by mouth 2 (two) times daily as needed for cough.   EpiPen 2-Pak 0.3 mg/0.3 mL Soaj injection Generic drug: EPINEPHrine Inject 0.3 mg into the muscle as needed for anaphylaxis.   halobetasol 0.05 % cream Commonly known as: ULTRAVATE Apply 1 application topically 2 (two) times daily. Apply topically twice  daily after showers   hydrochlorothiazide 25 MG tablet Commonly known as: HYDRODIURIL Take 1 tablet (25 mg total) by mouth daily.   montelukast 10 MG tablet Commonly known as:  SINGULAIR Take 10 mg by mouth at bedtime.   naproxen sodium 220 MG tablet Commonly known as: ALEVE Take 440 mg by mouth. Take 2 tabs (440 mg total) by mouth as needed for pain.   predniSONE 20 MG tablet Commonly known as: Deltasone Take 2 tablets (40 mg total) by mouth daily for 3 days, THEN 1 tablet (20 mg total) daily for 3 days, THEN 0.5 tablets (10 mg total) daily for 3 days. Start taking on: December 17, 2019 What changed:   medication strength  See the new instructions.       Follow-up Information    [facility] EMERGENCY DEPARTMENT.   Specialty: Emergency Medicine Why: If symptoms worsen Contact information: [id number] [address] [place] [address] [phone]       Schedule an appointment as soon as possible for a visit  with [facility].   Specialty: General Practice Contact information: [address] [place] [address] [phone]               Signed: Marrion Coy 12/17/2019, 10:11 AM

## 2020-05-15 ENCOUNTER — Emergency Department
Admission: EM | Admit: 2020-05-15 | Discharge: 2020-05-15 | Disposition: A | Discharge status: Home / Self Care | Payer: 59 | Attending: Emergency Medicine | Admitting: Emergency Medicine

## 2020-05-15 ENCOUNTER — Other Ambulatory Visit: Payer: Self-pay

## 2020-05-15 DIAGNOSIS — Z7951 Long term (current) use of inhaled steroids: Secondary | ICD-10-CM | POA: Diagnosis not present

## 2020-05-15 DIAGNOSIS — J45901 Unspecified asthma with (acute) exacerbation: Secondary | ICD-10-CM | POA: Insufficient documentation

## 2020-05-15 DIAGNOSIS — Z87891 Personal history of nicotine dependence: Secondary | ICD-10-CM | POA: Insufficient documentation

## 2020-05-15 DIAGNOSIS — Z91010 Allergy to peanuts: Secondary | ICD-10-CM | POA: Insufficient documentation

## 2020-05-15 DIAGNOSIS — E1169 Type 2 diabetes mellitus with other specified complication: Secondary | ICD-10-CM | POA: Insufficient documentation

## 2020-05-15 DIAGNOSIS — J45909 Unspecified asthma, uncomplicated: Secondary | ICD-10-CM

## 2020-05-15 MED ORDER — IPRATROPIUM-ALBUTEROL 0.5-2.5 (3) MG/3ML IN SOLN
3.0000 mL | Freq: Once | RESPIRATORY_TRACT | Status: AC
  Administered 2020-05-15: 3 mL via RESPIRATORY_TRACT
  Filled 2020-05-15: qty 3

## 2020-05-15 MED ORDER — BUDESONIDE-FORMOTEROL FUMARATE 160-4.5 MCG/ACT IN AERO
INHALATION_SPRAY | RESPIRATORY_TRACT | 0 refills | Status: DC
  Filled 2020-05-15: qty 10.2, 30d supply, fill #0

## 2020-05-15 MED ORDER — PREDNISONE 10 MG PO TABS
50.0000 mg | ORAL_TABLET | Freq: Every day | ORAL | 0 refills | Status: DC
  Filled 2020-05-15: qty 20, 4d supply, fill #0

## 2020-05-15 MED ORDER — PREDNISONE 20 MG PO TABS
60.0000 mg | ORAL_TABLET | Freq: Once | ORAL | Status: AC
  Administered 2020-05-15: 60 mg via ORAL
  Filled 2020-05-15: qty 3

## 2020-05-15 NOTE — ED Notes (Signed)
Lung sounds clear bilaterally

## 2020-05-15 NOTE — ED Notes (Signed)
Audible wheezes noted bilaterally. Pt states she last had nebulizer treatment at home yesterday.

## 2020-05-15 NOTE — ED Provider Notes (Signed)
[facility] Emergency Department Provider Note   ____________________________________________    I have reviewed the triage vital signs and the nursing notes.   HISTORY  Chief Complaint Asthma     HPI Kelsey Mcclure is a 30 y.o. female with a history of asthma and diabetes who presents with complaints of asthma exacerbation.  She reports she has been using nebulizer at home with only brief temporary improvement.  She denies chest pain.  No significant cough fevers chills.  Reports compliance with her medications.  No recent travel.  No calf pain or swelling.  Past Medical History:  Diagnosis Date  . Asthma   . Diabetes mellitus without complication (HCC)   . Eczema     Patient Active Problem List   Diagnosis Date Noted  . Acute respiratory failure with hypoxia (HCC) 12/15/2019  . Type 2 diabetes mellitus with hyperglycemia (HCC) 10/26/2018  . Morbid obesity (HCC) 10/26/2018  . Depression 10/26/2018  . Asthma exacerbation 10/26/2018  . Elevated BP without diagnosis of hypertension 10/26/2018  . Posterior subcapsular polar cataract, nonsenile 12/08/2017    Past Surgical History:  Procedure Laterality Date  . CATARACT EXTRACTION, BILATERAL      Prior to Admission medications   Medication Sig Start Date End Date Taking? Authorizing Provider  predniSONE (DELTASONE) 50 MG tablet Take 1 tablet (50 mg total) by mouth daily with breakfast. 05/15/20  Yes Jene Every, MD  albuterol (PROVENTIL) (2.5 MG/3ML) 0.083% nebulizer solution USE 1 VIAL VIA NEBULIZER EVERY 4 HOURS AS NEEDED FOR WHEEZING OR SHORT OF BREATH 12/17/19 05/23/20  Marrion Coy, MD  budesonide-formoterol [facility]) 160-4.5 MCG/ACT inhaler INHALE 2 PUFFS INTO THE LUNGS EVERY MORNING AND AT BEDTIME 05/15/20 05/15/21  Jene Every, MD  dextromethorphan-guaiFENesin [facility] DM) 30-600 MG 12hr tablet Take 1 tablet by mouth 2 (two) times daily as needed for cough. 11/15/19   Rolly Salter, MD  EPINEPHrine (EPIPEN 2-PAK) 0.3 mg/0.3 mL IJ SOAJ injection Inject 0.3 mg into the muscle as needed for anaphylaxis.    [provider]  halobetasol (ULTRAVATE) 0.05 % cream Apply 1 application topically 2 (two) times daily. Apply topically twice daily after showers    [provider]  hydrochlorothiazide (HYDRODIURIL) 25 MG tablet Take 1 tablet (25 mg total) by mouth daily. 11/15/19   Rolly Salter, MD  montelukast (SINGULAIR) 10 MG tablet Take 10 mg by mouth at bedtime.    [provider]  naproxen sodium (ALEVE) 220 MG tablet Take 440 mg by mouth. Take 2 tabs (440 mg total) by mouth as needed for pain.    [provider]     Allergies Bee venom, Peanut oil, and Shellfish allergy  Family History  Problem Relation Age of Onset  . Eczema Mother   . Diabetes Father   . Psoriasis Father   . Eczema Father   . Sleep apnea Father   . Asthma Sister   . Eczema Sister   . Eczema Sister     Social History Social History   Tobacco Use  . Smoking status: Former Smoker    Packs/day: 0.50    Types: Cigarettes    Quit date: 04/16/2019    Years since quitting: 1.0  . Smokeless tobacco: Never Used  Vaping Use  . Vaping Use: Never used  Substance Use Topics  . Alcohol use: Yes    Alcohol/week: 4.0 standard drinks    Types: 4 Shots of liquor per week  . Drug use: Yes  Types: Marijuana    Review of Systems  Constitutional: No fever/chills Eyes: No visual changes.  ENT: No sore throat. Cardiovascular: Denies chest pain. Respiratory: Positive shortness of breath Gastrointestinal: No abdominal pain.  No nausea, no vomiting.   Genitourinary: Negative for dysuria. Musculoskeletal: Negative for back pain. Skin: Negative for rash. Neurological: Negative for headaches or weakness   ____________________________________________   PHYSICAL EXAM:  VITAL SIGNS: ED Triage Vitals  Enc Vitals Group     BP 05/15/20 1151 99/86     Pulse  Rate 05/15/20 1151 89     Resp 05/15/20 1151 (!) 22     Temp 05/15/20 1148 98.5 F (36.9 C)     Temp Source 05/15/20 1148 Oral     SpO2 05/15/20 1151 94 %     Weight 05/15/20 1148 (!) 145.2 kg (320 lb)     Height 05/15/20 1148 1.6 m (5\' 3" )     Head Circumference --      Peak Flow --      Pain Score 05/15/20 1148 5     Pain Loc --      Pain Edu? --      Excl. in GC? --     Constitutional: Alert and oriented. No acute distress. Pleasant and interactive  Nose: No congestion/rhinnorhea. Mouth/Throat: Mucous membranes are moist.   Neck:  Painless ROM Cardiovascular: Normal rate, regular rhythm. Grossly normal heart sounds.  Good peripheral circulation. Respiratory: Normal respiratory effort.  No retractions.  Diffuse mild wheezing Gastrointestinal: Soft and nontender. No distention.  No CVA tenderness.  Musculoskeletal: No lower extremity tenderness nor edema.  Warm and well perfused Neurologic:  Normal speech and language. No gross focal neurologic deficits are appreciated.  Skin:  Skin is warm, dry and intact. No rash noted. Psychiatric: Mood and affect are normal. Speech and behavior are normal.  ____________________________________________   LABS (all labs ordered are listed, but only abnormal results are displayed)  Labs Reviewed - No data to display ____________________________________________  EKG  ED ECG REPORT I, 05/17/20, the attending physician, personally viewed and interpreted this ECG.  Date: 05/15/2020  Rhythm: normal sinus rhythm QRS Axis: normal Intervals: normal ST/T Wave abnormalities: normal Narrative Interpretation: no evidence of acute ischemia  ____________________________________________  RADIOLOGY  None ____________________________________________   PROCEDURES  Procedure(s) performed: No  Procedures   Critical Care performed: No ____________________________________________   INITIAL IMPRESSION / ASSESSMENT AND PLAN / ED  COURSE  Pertinent labs & imaging results that were available during my care of the patient were reviewed by me and considered in my medical decision making (see chart for details).  Patient presents with shortness of breath as noted above, exam is consistent with asthma exacerbation.  Patient treated with multiple duo nebs with significant improvement, p.o. prednisone given as well  No further wheezing after treatment, she states she feels much better  Will do burst prednisone x5 days, Symbicort refilled, return precautions discussed    ____________________________________________   FINAL CLINICAL IMPRESSION(S) / ED DIAGNOSES  Final diagnoses:  Exacerbation of asthma, unspecified asthma severity, unspecified whether persistent        Note:  This document was prepared using Dragon voice recognition software and may include unintentional dictation errors.   05/17/2020, MD 05/15/20 1321

## 2020-05-15 NOTE — ED Notes (Signed)
Pt states she is feeling much better.

## 2020-05-15 NOTE — ED Triage Notes (Signed)
Pt to ED POV for possible asthma exacerbation for a few days. No relief with rescue inhaler. Reports shob with exertion . Expiratory wheezing noted

## 2020-05-18 ENCOUNTER — Other Ambulatory Visit: Payer: Self-pay

## 2020-07-01 ENCOUNTER — Emergency Department
Admission: EM | Admit: 2020-07-01 | Discharge: 2020-07-01 | Disposition: A | Discharge status: Home / Self Care | Payer: 59 | Attending: Emergency Medicine | Admitting: Emergency Medicine

## 2020-07-01 ENCOUNTER — Other Ambulatory Visit: Payer: Self-pay

## 2020-07-01 ENCOUNTER — Encounter: Payer: Self-pay | Admitting: Emergency Medicine

## 2020-07-01 DIAGNOSIS — K529 Noninfective gastroenteritis and colitis, unspecified: Secondary | ICD-10-CM | POA: Diagnosis not present

## 2020-07-01 DIAGNOSIS — Z91010 Allergy to peanuts: Secondary | ICD-10-CM | POA: Diagnosis not present

## 2020-07-01 DIAGNOSIS — J45909 Unspecified asthma, uncomplicated: Secondary | ICD-10-CM | POA: Diagnosis not present

## 2020-07-01 DIAGNOSIS — Z7951 Long term (current) use of inhaled steroids: Secondary | ICD-10-CM | POA: Diagnosis not present

## 2020-07-01 DIAGNOSIS — E119 Type 2 diabetes mellitus without complications: Secondary | ICD-10-CM | POA: Diagnosis not present

## 2020-07-01 DIAGNOSIS — K625 Hemorrhage of anus and rectum: Secondary | ICD-10-CM | POA: Insufficient documentation

## 2020-07-01 DIAGNOSIS — Z87891 Personal history of nicotine dependence: Secondary | ICD-10-CM | POA: Insufficient documentation

## 2020-07-01 LAB — URINALYSIS, COMPLETE (UACMP) WITH MICROSCOPIC
Bacteria, UA: NONE SEEN
Bilirubin Urine: NEGATIVE
Glucose, UA: NEGATIVE mg/dL
Hgb urine dipstick: NEGATIVE
Ketones, ur: NEGATIVE mg/dL
Leukocytes,Ua: NEGATIVE
Nitrite: NEGATIVE
Protein, ur: NEGATIVE mg/dL
Specific Gravity, Urine: 1.02 (ref 1.005–1.030)
pH: 5 (ref 5.0–8.0)

## 2020-07-01 LAB — CBC
HCT: 38.5 % (ref 36.0–46.0)
Hemoglobin: 12.4 g/dL (ref 12.0–15.0)
MCH: 25.7 pg — ABNORMAL LOW (ref 26.0–34.0)
MCHC: 32.2 g/dL (ref 30.0–36.0)
MCV: 79.9 fL — ABNORMAL LOW (ref 80.0–100.0)
Platelets: 338 10*3/uL (ref 150–400)
RBC: 4.82 MIL/uL (ref 3.87–5.11)
RDW: 15 % (ref 11.5–15.5)
WBC: 10.8 10*3/uL — ABNORMAL HIGH (ref 4.0–10.5)
nRBC: 0 % (ref 0.0–0.2)

## 2020-07-01 LAB — POC URINE PREG, ED: Preg Test, Ur: NEGATIVE

## 2020-07-01 LAB — COMPREHENSIVE METABOLIC PANEL
ALT: 22 U/L (ref 0–44)
AST: 23 U/L (ref 15–41)
Albumin: 3.9 g/dL (ref 3.5–5.0)
Alkaline Phosphatase: 63 U/L (ref 38–126)
Anion gap: 9 (ref 5–15)
BUN: 19 mg/dL (ref 6–20)
CO2: 25 mmol/L (ref 22–32)
Calcium: 9.2 mg/dL (ref 8.9–10.3)
Chloride: 102 mmol/L (ref 98–111)
Creatinine, Ser: 0.7 mg/dL (ref 0.44–1.00)
GFR, Estimated: >60 mL/min (ref >60)
Glucose, Bld: 126 mg/dL — ABNORMAL HIGH (ref 70–99)
Potassium: 3.7 mmol/L (ref 3.5–5.1)
Sodium: 136 mmol/L (ref 135–145)
Total Bilirubin: 0.5 mg/dL (ref 0.3–1.2)
Total Protein: 7.8 g/dL (ref 6.5–8.1)

## 2020-07-01 LAB — TYPE AND SCREEN
ABO/RH(D): A POS
Antibody Screen: NEGATIVE

## 2020-07-01 NOTE — ED Triage Notes (Signed)
Pt comes into the ED via POV c/o abdominal pain, rectal bleeding, diarrhea and some nausea.  Pt states the abdominal pain has been intermittent x a week but the rectal bleeding started today. Pt states the blood was bright red in color and she denies any known history of hemorrhoids or fissures.  Pt ambulatory to triage at this time and in NAD.

## 2020-07-01 NOTE — ED Notes (Signed)
Patient c/o "churning" feeling/discomfort in abdomen. Denies actual abdominal pain at this time.

## 2020-07-01 NOTE — ED Provider Notes (Signed)
[facility] Emergency Department Provider Note   ____________________________________________   Event Date/Time   First MD Initiated Contact with Patient 07/01/20 2114     (approximate)  I have reviewed the triage vital signs and the nursing notes.   HISTORY  Chief Complaint Abdominal Pain and Rectal Bleeding    HPI Kelsey Mcclure is a 30 y.o. female with past medical history of asthma and diabetes who presents to the ED complaining of abdominal pain and rectal bleeding.  Patient reports that she has been having intermittent cramping pain in her abdomen for about 1 week.  Pain is not exacerbated or alleviated by anything and she has not had any nausea or vomiting, although she does endorse loose and watery stools.  She became more concerned earlier today when she noticed some bright red blood mixed in with her stool.  She has not had any further bloody stools since then and she denies any rectal pain.  She has never had similar symptoms in the past and does not take any blood thinners.  She has minimal abdominal pain at this time.        Past Medical History:  Diagnosis Date  . Asthma   . Diabetes mellitus without complication (HCC)   . Eczema     Patient Active Problem List   Diagnosis Date Noted  . Acute respiratory failure with hypoxia (HCC) 12/15/2019  . Type 2 diabetes mellitus with hyperglycemia (HCC) 10/26/2018  . Morbid obesity (HCC) 10/26/2018  . Depression 10/26/2018  . Asthma exacerbation 10/26/2018  . Elevated BP without diagnosis of hypertension 10/26/2018  . Posterior subcapsular polar cataract, nonsenile 12/08/2017    Past Surgical History:  Procedure Laterality Date  . CATARACT EXTRACTION, BILATERAL      Prior to Admission medications   Medication Sig Start Date End Date Taking? Authorizing Provider  albuterol (PROVENTIL) (2.5 MG/3ML) 0.083% nebulizer solution USE 1 VIAL VIA NEBULIZER EVERY 4 HOURS AS NEEDED FOR WHEEZING  OR SHORT OF BREATH 12/17/19 05/23/20  Marrion Coy, MD  budesonide-formoterol [facility]) 160-4.5 MCG/ACT inhaler INHALE 2 PUFFS INTO THE LUNGS EVERY MORNING AND AT BEDTIME 05/15/20 05/15/21  Jene Every, MD  dextromethorphan-guaiFENesin [facility] DM) 30-600 MG 12hr tablet Take 1 tablet by mouth 2 (two) times daily as needed for cough. 11/15/19   Rolly Salter, MD  EPINEPHrine (EPIPEN 2-PAK) 0.3 mg/0.3 mL IJ SOAJ injection Inject 0.3 mg into the muscle as needed for anaphylaxis.    [provider]  halobetasol (ULTRAVATE) 0.05 % cream Apply 1 application topically 2 (two) times daily. Apply topically twice daily after showers    [provider]  hydrochlorothiazide (HYDRODIURIL) 25 MG tablet Take 1 tablet (25 mg total) by mouth daily. 11/15/19   Rolly Salter, MD  montelukast (SINGULAIR) 10 MG tablet Take 10 mg by mouth at bedtime.    [provider]  naproxen sodium (ALEVE) 220 MG tablet Take 440 mg by mouth. Take 2 tabs (440 mg total) by mouth as needed for pain.    [provider]  predniSONE (DELTASONE) 10 MG tablet Take 5 tablets (50 mg total) by mouth daily with breakfast for 4 days. 05/15/20   Jene Every, MD    Allergies Bee venom, Peanut oil, and Shellfish allergy  Family History  Problem Relation Age of Onset  . Eczema Mother   . Diabetes Father   . Psoriasis Father   . Eczema Father   . Sleep apnea Father   . Asthma Sister   .  Eczema Sister   . Eczema Sister     Social History Social History   Tobacco Use  . Smoking status: Former Smoker    Packs/day: 0.50    Types: Cigarettes    Quit date: 04/16/2019    Years since quitting: 1.2  . Smokeless tobacco: Never Used  Vaping Use  . Vaping Use: Never used  Substance Use Topics  . Alcohol use: Yes    Alcohol/week: 4.0 standard drinks    Types: 4 Shots of liquor per week  . Drug use: Yes    Types: Marijuana    Review of Systems  Constitutional: No fever/chills Eyes: No visual  changes. ENT: No sore throat. Cardiovascular: Denies chest pain. Respiratory: Denies shortness of breath. Gastrointestinal: Positive for abdominal pain.  No nausea, no vomiting.  Positive for diarrhea.  No constipation.  Positive for bloody stool. Genitourinary: Negative for dysuria. Musculoskeletal: Negative for back pain. Skin: Negative for rash. Neurological: Negative for headaches, focal weakness or numbness.  ____________________________________________   PHYSICAL EXAM:  VITAL SIGNS: ED Triage Vitals [07/01/20 1633]  Enc Vitals Group     BP (!) 148/94     Pulse Rate 87     Resp 18     Temp 98.2 F (36.8 C)     Temp Source Oral     SpO2 95 %     Weight (!) 320 lb (145.2 kg)     Height 5' 3.5" (1.613 m)     Head Circumference      Peak Flow      Pain Score 5     Pain Loc      Pain Edu?      Excl. in GC?     Constitutional: Alert and oriented. Eyes: Conjunctivae are normal. Head: Atraumatic. Nose: No congestion/rhinnorhea. Mouth/Throat: Mucous membranes are moist. Neck: Normal ROM Cardiovascular: Normal rate, regular rhythm. Grossly normal heart sounds. Respiratory: Normal respiratory effort.  No retractions. Lungs CTAB. Gastrointestinal: Soft and nontender. No distention.  Rectal exam with light brown guaiac-negative stool. Genitourinary: deferred Musculoskeletal: No lower extremity tenderness nor edema. Neurologic:  Normal speech and language. No gross focal neurologic deficits are appreciated. Skin:  Skin is warm, dry and intact. No rash noted. Psychiatric: Mood and affect are normal. Speech and behavior are normal.  ____________________________________________   LABS (all labs ordered are listed, but only abnormal results are displayed)  Labs Reviewed  COMPREHENSIVE METABOLIC PANEL - Abnormal; Notable for the following components:      Result Value   Glucose, Bld 126 (*)    All other components within normal limits  CBC - Abnormal; Notable for the  following components:   WBC 10.8 (*)    MCV 79.9 (*)    MCH 25.7 (*)    All other components within normal limits  URINALYSIS, COMPLETE (UACMP) WITH MICROSCOPIC - Abnormal; Notable for the following components:   Color, Urine YELLOW (*)    APPearance CLEAR (*)    All other components within normal limits  POC OCCULT BLOOD, ED  POC URINE PREG, ED  TYPE AND SCREEN    PROCEDURES  Procedure(s) performed (including Critical Care):  Procedures   ____________________________________________   INITIAL IMPRESSION / ASSESSMENT AND PLAN / ED COURSE       30 year old female with past medical history of asthma and diabetes who presents to the ED complaining of intermittent crampy abdominal pain for the past week associated with episode of bloody stool earlier today.  Labs are reassuring, LFTs within  normal limits and hemoglobin is stable compared to previous.  Rectal exam shows no active bleeding, no evidence of hemorrhoids or anal fissure.  Patient may have some rectal irritation from recent watery stool but no evidence to suggest significant GI bleed at this time.  She has no tenderness on exam and given reassuring work-up thus far, I do not feel additional imaging is indicated.  Pregnancy testing and UA are pending.  Pregnancy testing is negative, UA shows no signs of infection.  Patient currently describes abdominal discomfort as a "churning" that seems most consistent with a gastroenteritis given her loose stools.  She is appropriate for discharge home with PCP follow-up, was counseled to return to the ED for new worsening symptoms.  Patient agrees with plan.      ____________________________________________   FINAL CLINICAL IMPRESSION(S) / ED DIAGNOSES  Final diagnoses:  Rectal bleeding  Gastroenteritis     ED Discharge Orders    None       Note:  This document was prepared using Dragon voice recognition software and may include unintentional dictation errors.   Chesley Noon, MD 07/01/20 2257

## 2020-10-23 ENCOUNTER — Emergency Department
Admission: EM | Admit: 2020-10-23 | Discharge: 2020-10-23 | Disposition: A | Discharge status: Home / Self Care | Payer: 59 | Attending: Emergency Medicine | Admitting: Emergency Medicine

## 2020-10-23 ENCOUNTER — Other Ambulatory Visit: Payer: Self-pay

## 2020-10-23 DIAGNOSIS — Z87891 Personal history of nicotine dependence: Secondary | ICD-10-CM | POA: Diagnosis not present

## 2020-10-23 DIAGNOSIS — J45901 Unspecified asthma with (acute) exacerbation: Secondary | ICD-10-CM | POA: Insufficient documentation

## 2020-10-23 DIAGNOSIS — U071 COVID-19: Secondary | ICD-10-CM | POA: Insufficient documentation

## 2020-10-23 DIAGNOSIS — E119 Type 2 diabetes mellitus without complications: Secondary | ICD-10-CM | POA: Diagnosis not present

## 2020-10-23 DIAGNOSIS — Z79899 Other long term (current) drug therapy: Secondary | ICD-10-CM | POA: Insufficient documentation

## 2020-10-23 DIAGNOSIS — Z91010 Allergy to peanuts: Secondary | ICD-10-CM | POA: Diagnosis not present

## 2020-10-23 DIAGNOSIS — R059 Cough, unspecified: Secondary | ICD-10-CM | POA: Diagnosis present

## 2020-10-23 LAB — GROUP A STREP BY PCR: Group A Strep by PCR: NOT DETECTED

## 2020-10-23 LAB — RESP PANEL BY RT-PCR (FLU A&B, COVID) ARPGX2
Influenza A by PCR: NEGATIVE
Influenza B by PCR: NEGATIVE
SARS Coronavirus 2 by RT PCR: POSITIVE — AB

## 2020-10-23 MED ORDER — IBUPROFEN 800 MG PO TABS
800.0000 mg | ORAL_TABLET | Freq: Three times a day (TID) | ORAL | 0 refills | Status: DC | PRN
  Filled 2020-10-23: qty 15, 5d supply, fill #0

## 2020-10-23 MED ORDER — PSEUDOEPH-BROMPHEN-DM 30-2-10 MG/5ML PO SYRP
5.0000 mL | ORAL_SOLUTION | Freq: Four times a day (QID) | ORAL | 0 refills | Status: DC | PRN
  Filled 2020-10-23: qty 120, 6d supply, fill #0

## 2020-10-23 MED ORDER — LIDOCAINE VISCOUS HCL 2 % MT SOLN
5.0000 mL | Freq: Four times a day (QID) | OROMUCOSAL | 0 refills | Status: DC | PRN
  Filled 2020-10-23: qty 100, 5d supply, fill #0

## 2020-10-23 NOTE — ED Triage Notes (Signed)
Pt here with flu-like symptoms for the past few days. PT states she has a scratchy throat and chills and has been at work with a few other sick employees. Pt denies N/V but states she has had diarrhea for 2 days.

## 2020-10-23 NOTE — Discharge Instructions (Signed)
Read and follow discharge care instructions.  Per CDC recommendation must self quarantine for 5 days.

## 2020-10-23 NOTE — ED Notes (Signed)
See triage note  presents with sore throat   subjective fever  and ear pain  states sxs' started 2 days ago  afebrile on arrival

## 2020-10-23 NOTE — ED Provider Notes (Signed)
[facility] ([place]) Emergency Department Provider Note   ____________________________________________   Event Date/Time   First MD Initiated Contact with Patient 10/23/20 [phone]     (approximate)  I have reviewed the triage vital signs and the nursing notes.   HISTORY  Chief Complaint Influenza    HPI Kelsey Mcclure is a 30 y.o. female patient presents with extensive past few days.  Patient's complaint of sore throat, body aches, chills, and nonproductive cough.  Patient also states diarrhea.  Patient has taken the COVID-vaccine.  Patient had taken flu shot for the season.  Rates her pain as a 5/10.  Described pain as "achy".  No palliative measure for complaint.       Past Medical History:  Diagnosis Date   Asthma    Diabetes mellitus without complication (HCC)    Eczema     Patient Active Problem List   Diagnosis Date Noted   Acute respiratory failure with hypoxia (HCC) 12/15/2019   Type 2 diabetes mellitus with hyperglycemia (HCC) 10/26/2018   Morbid obesity (HCC) 10/26/2018   Depression 10/26/2018   Asthma exacerbation 10/26/2018   Elevated BP without diagnosis of hypertension 10/26/2018   Posterior subcapsular polar cataract, nonsenile 12/08/2017    Past Surgical History:  Procedure Laterality Date   CATARACT EXTRACTION, BILATERAL      Prior to Admission medications   Medication Sig Start Date End Date Taking? Authorizing Provider  brompheniramine-pseudoephedrine-DM 30-2-10 MG/5ML syrup Take 5 mLs by mouth 4 (four) times daily as needed. Mix with 5 mL of viscous lidocaine for swish and swallow 10/23/20  Yes Joni Reining, PA-C  ibuprofen (ADVIL) 800 MG tablet Take 1 tablet (800 mg total) by mouth every 8 (eight) hours as needed for moderate pain. 10/23/20  Yes Joni Reining, PA-C  lidocaine (XYLOCAINE) 2 % solution Use as directed 5 mLs in the mouth or throat every 6 (six) hours as needed for mouth pain. Mix with 5 mL of Bromfed-DM for  swish and swallow 10/23/20  Yes Joni Reining, PA-C  albuterol (PROVENTIL) (2.5 MG/3ML) 0.083% nebulizer solution USE 1 VIAL VIA NEBULIZER EVERY 4 HOURS AS NEEDED FOR WHEEZING OR SHORT OF BREATH 12/17/19 05/23/20  Marrion Coy, MD  budesonide-formoterol [facility]) 160-4.5 MCG/ACT inhaler INHALE 2 PUFFS INTO THE LUNGS EVERY MORNING AND AT BEDTIME 05/15/20 05/15/21  Jene Every, MD  EPINEPHrine (EPIPEN 2-PAK) 0.3 mg/0.3 mL IJ SOAJ injection Inject 0.3 mg into the muscle as needed for anaphylaxis.    [provider]  halobetasol (ULTRAVATE) 0.05 % cream Apply 1 application topically 2 (two) times daily. Apply topically twice daily after showers    [provider]  hydrochlorothiazide (HYDRODIURIL) 25 MG tablet Take 1 tablet (25 mg total) by mouth daily. 11/15/19   Rolly Salter, MD  montelukast (SINGULAIR) 10 MG tablet Take 10 mg by mouth at bedtime.    [provider]  naproxen sodium (ALEVE) 220 MG tablet Take 440 mg by mouth. Take 2 tabs (440 mg total) by mouth as needed for pain.    [provider]  predniSONE (DELTASONE) 10 MG tablet Take 5 tablets (50 mg total) by mouth daily with breakfast for 4 days. 05/15/20   Jene Every, MD    Allergies Bee venom, Peanut oil, and Shellfish allergy  Family History  Problem Relation Age of Onset   Eczema Mother    Diabetes Father    Psoriasis Father    Eczema Father    Sleep apnea Father  Asthma Sister    Eczema Sister    Eczema Sister     Social History Social History   Tobacco Use   Smoking status: Former    Packs/day: 0.50    Types: Cigarettes    Quit date: 04/16/2019    Years since quitting: 1.5   Smokeless tobacco: Never  Vaping Use   Vaping Use: Never used  Substance Use Topics   Alcohol use: Yes    Alcohol/week: 4.0 standard drinks    Types: 4 Shots of liquor per week   Drug use: Yes    Types: Marijuana    Review of Systems Constitutional: Chills and body aches.   Eyes: No visual  changes. ENT: Nasal congestion and sore throat. Cardiovascular: Denies chest pain. Respiratory: Denies shortness of breath.  Nonproductive cough. Gastrointestinal: No abdominal pain.  No nausea, no vomiting.  No diarrhea.  No constipation. Genitourinary: Negative for dysuria. Musculoskeletal: Negative for back pain. Skin: Negative for rash. Neurological: Negative for headaches, focal weakness or numbness. Psychiatric: Depression. Endocrine: Diabetes. Allergic/Immunilogical: Bee venom, peanut oral, shellfish. ____________________________________________   PHYSICAL EXAM:  VITAL SIGNS: ED Triage Vitals  Enc Vitals Group     BP 10/23/20 0703 (!) 158/107     Pulse Rate 10/23/20 0703 84     Resp 10/23/20 0703 16     Temp 10/23/20 0703 98.2 F (36.8 C)     Temp Source 10/23/20 0703 Oral     SpO2 10/23/20 0703 96 %     Weight 10/23/20 0704 (!) 330 lb (149.7 kg)     Height 10/23/20 0704 5\' 3"  (1.6 m)     Head Circumference --      Peak Flow --      Pain Score 10/23/20 0703 5     Pain Loc --      Pain Edu? --      Excl. in GC? --     Constitutional: Alert and oriented. Well appearing and in no acute distress.  BMI is 58.46. Eyes: Conjunctivae are normal. PERRL. EOMI. Head: Atraumatic. Nose: Clear rhinorrhea.   Mouth/Throat: Mucous membranes are moist.  Oropharynx non-erythematous.  Postnasal drainage. Neck: No stridor.   Hematological/Lymphatic/Immunilogical: No cervical lymphadenopathy. Cardiovascular: Normal rate, regular rhythm. Grossly normal heart sounds.  Good peripheral circulation.  Elevated blood pressure Respiratory: Normal respiratory effort.  No retractions. Lungs CTAB. Gastrointestinal: Soft and nontender. No distention. No abdominal bruits. No CVA tenderness. Genitourinary: Deferred Musculoskeletal: No lower extremity tenderness nor edema.  No joint effusions. Neurologic:  Normal speech and language. No gross focal neurologic deficits are appreciated. No gait  instability. Skin:  Skin is warm, dry and intact. No rash noted. Psychiatric: Mood and affect are normal. Speech and behavior are normal.  ____________________________________________   LABS (all labs ordered are listed, but only abnormal results are displayed)  Labs Reviewed  RESP PANEL BY RT-PCR (FLU A&B, COVID) ARPGX2 - Abnormal; Notable for the following components:      Result Value   SARS Coronavirus 2 by RT PCR POSITIVE (*)    All other components within normal limits  GROUP A STREP BY PCR   ____________________________________________  EKG   ____________________________________________  RADIOLOGY I, 10/25/20, personally viewed and evaluated these images (plain radiographs) as part of my medical decision making, as well as reviewing the written report by the radiologist.  ED MD interpretation:    Official radiology report(s): No results found.  ____________________________________________   PROCEDURES  Procedure(s) performed (including Critical Care):  Procedures  ____________________________________________   INITIAL IMPRESSION / ASSESSMENT AND PLAN / ED COURSE  As part of my medical decision making, I reviewed the following data within the electronic MEDICAL RECORD NUMBER         Patient presents with flulike symptoms for a few days.  Patient tested negative for influenza.  Patient test positive COVID-19.  Patient given discharge care instruction and a work note.  Advised take medication as directed.  [facility]      ____________________________________________   FINAL CLINICAL IMPRESSION(S) / ED DIAGNOSES  Final diagnoses:  COVID-19     ED Discharge Orders          Ordered    brompheniramine-pseudoephedrine-DM 30-2-10 MG/5ML syrup  4 times daily PRN        10/23/20 0841    lidocaine (XYLOCAINE) 2 % solution  Every 6 hours PRN        10/23/20 0841    ibuprofen (ADVIL) 800 MG tablet  Every 8 hours PRN         10/23/20 0841             Note:  This document was prepared using Dragon voice recognition software and may include unintentional dictation errors.    Joni Reining, PA-C 10/23/20 9449    Sharyn Creamer, MD 10/23/20 [phone]

## 2020-12-05 ENCOUNTER — Other Ambulatory Visit: Payer: Self-pay

## 2020-12-05 ENCOUNTER — Emergency Department: Payer: 59

## 2020-12-05 ENCOUNTER — Observation Stay
Admission: EM | Admit: 2020-12-05 | Discharge: 2020-12-06 | Disposition: A | Discharge status: Home / Self Care | Payer: 59 | Attending: Internal Medicine | Admitting: Internal Medicine

## 2020-12-05 DIAGNOSIS — I10 Essential (primary) hypertension: Secondary | ICD-10-CM | POA: Insufficient documentation

## 2020-12-05 DIAGNOSIS — Z79899 Other long term (current) drug therapy: Secondary | ICD-10-CM | POA: Insufficient documentation

## 2020-12-05 DIAGNOSIS — Z8616 Personal history of COVID-19: Secondary | ICD-10-CM | POA: Diagnosis not present

## 2020-12-05 DIAGNOSIS — J4541 Moderate persistent asthma with (acute) exacerbation: Secondary | ICD-10-CM | POA: Diagnosis not present

## 2020-12-05 DIAGNOSIS — Z87891 Personal history of nicotine dependence: Secondary | ICD-10-CM | POA: Diagnosis not present

## 2020-12-05 DIAGNOSIS — Z20822 Contact with and (suspected) exposure to covid-19: Secondary | ICD-10-CM | POA: Insufficient documentation

## 2020-12-05 DIAGNOSIS — R062 Wheezing: Secondary | ICD-10-CM | POA: Diagnosis present

## 2020-12-05 DIAGNOSIS — J45901 Unspecified asthma with (acute) exacerbation: Secondary | ICD-10-CM | POA: Diagnosis present

## 2020-12-05 DIAGNOSIS — F32A Depression, unspecified: Secondary | ICD-10-CM | POA: Diagnosis present

## 2020-12-05 DIAGNOSIS — J4551 Severe persistent asthma with (acute) exacerbation: Secondary | ICD-10-CM

## 2020-12-05 DIAGNOSIS — Z91010 Allergy to peanuts: Secondary | ICD-10-CM | POA: Diagnosis not present

## 2020-12-05 DIAGNOSIS — E119 Type 2 diabetes mellitus without complications: Secondary | ICD-10-CM

## 2020-12-05 DIAGNOSIS — R030 Elevated blood-pressure reading, without diagnosis of hypertension: Secondary | ICD-10-CM | POA: Diagnosis not present

## 2020-12-05 DIAGNOSIS — R0602 Shortness of breath: Secondary | ICD-10-CM

## 2020-12-05 LAB — COMPREHENSIVE METABOLIC PANEL
ALT: 23 U/L (ref 0–44)
AST: 24 U/L (ref 15–41)
Albumin: 3.8 g/dL (ref 3.5–5.0)
Alkaline Phosphatase: 72 U/L (ref 38–126)
Anion gap: 5 (ref 5–15)
BUN: 9 mg/dL (ref 6–20)
CO2: 30 mmol/L (ref 22–32)
Calcium: 8.8 mg/dL — ABNORMAL LOW (ref 8.9–10.3)
Chloride: 103 mmol/L (ref 98–111)
Creatinine, Ser: 0.69 mg/dL (ref 0.44–1.00)
GFR, Estimated: >60 mL/min (ref >60)
Glucose, Bld: 125 mg/dL — ABNORMAL HIGH (ref 70–99)
Potassium: 4 mmol/L (ref 3.5–5.1)
Sodium: 138 mmol/L (ref 135–145)
Total Bilirubin: 0.6 mg/dL (ref 0.3–1.2)
Total Protein: 8.2 g/dL — ABNORMAL HIGH (ref 6.5–8.1)

## 2020-12-05 LAB — RESP PANEL BY RT-PCR (FLU A&B, COVID) ARPGX2
Influenza A by PCR: NEGATIVE
Influenza B by PCR: NEGATIVE
SARS Coronavirus 2 by RT PCR: NEGATIVE

## 2020-12-05 LAB — CBC
HCT: 38.7 % (ref 36.0–46.0)
Hemoglobin: 11.9 g/dL — ABNORMAL LOW (ref 12.0–15.0)
MCH: 25.4 pg — ABNORMAL LOW (ref 26.0–34.0)
MCHC: 30.7 g/dL (ref 30.0–36.0)
MCV: 82.5 fL (ref 80.0–100.0)
Platelets: 377 10*3/uL (ref 150–400)
RBC: 4.69 MIL/uL (ref 3.87–5.11)
RDW: 14.9 % (ref 11.5–15.5)
WBC: 10.9 10*3/uL — ABNORMAL HIGH (ref 4.0–10.5)
nRBC: 0 % (ref 0.0–0.2)

## 2020-12-05 MED ORDER — IPRATROPIUM-ALBUTEROL 0.5-2.5 (3) MG/3ML IN SOLN
3.0000 mL | Freq: Once | RESPIRATORY_TRACT | Status: AC
  Administered 2020-12-05: 3 mL via RESPIRATORY_TRACT
  Filled 2020-12-05: qty 3

## 2020-12-05 MED ORDER — SODIUM CHLORIDE 0.9 % IV SOLN: 250.0000 mL | INTRAVENOUS | Status: DC | PRN

## 2020-12-05 MED ORDER — SODIUM CHLORIDE 0.9% FLUSH
3.0000 mL | Freq: Two times a day (BID) | INTRAVENOUS | Status: DC
  Administered 2020-12-05 (×2): 3 mL via INTRAVENOUS

## 2020-12-05 MED ORDER — SODIUM CHLORIDE 0.9% FLUSH: 3.0000 mL | INTRAVENOUS | Status: DC | PRN

## 2020-12-05 MED ORDER — HALOBETASOL PROPIONATE 0.05 % EX CREA: 1.0000 "application " | TOPICAL_CREAM | Freq: Two times a day (BID) | CUTANEOUS | Status: DC

## 2020-12-05 MED ORDER — IPRATROPIUM-ALBUTEROL 0.5-2.5 (3) MG/3ML IN SOLN: 3.0000 mL | Freq: Once | RESPIRATORY_TRACT | Status: DC

## 2020-12-05 MED ORDER — METHYLPREDNISOLONE SODIUM SUCC 125 MG IJ SOLR
125.0000 mg | Freq: Once | INTRAMUSCULAR | Status: AC
  Administered 2020-12-05: 125 mg via INTRAVENOUS
  Filled 2020-12-05: qty 2

## 2020-12-05 MED ORDER — ONDANSETRON HCL 4 MG PO TABS: 4.0000 mg | ORAL_TABLET | Freq: Four times a day (QID) | ORAL | Status: DC | PRN

## 2020-12-05 MED ORDER — ACETAMINOPHEN 325 MG PO TABS: 650.0000 mg | ORAL_TABLET | Freq: Four times a day (QID) | ORAL | Status: DC | PRN

## 2020-12-05 MED ORDER — HYDROCHLOROTHIAZIDE 25 MG PO TABS: 25.0000 mg | ORAL_TABLET | Freq: Every day | ORAL | Status: DC

## 2020-12-05 MED ORDER — ACETAMINOPHEN 650 MG RE SUPP: 650.0000 mg | Freq: Four times a day (QID) | RECTAL | Status: DC | PRN

## 2020-12-05 MED ORDER — MONTELUKAST SODIUM 10 MG PO TABS
10.0000 mg | ORAL_TABLET | Freq: Every day | ORAL | Status: DC
  Administered 2020-12-05: 20:00:00 10 mg via ORAL
  Filled 2020-12-05: qty 1

## 2020-12-05 MED ORDER — IPRATROPIUM-ALBUTEROL 0.5-2.5 (3) MG/3ML IN SOLN
3.0000 mL | Freq: Four times a day (QID) | RESPIRATORY_TRACT | Status: DC
  Administered 2020-12-05 – 2020-12-06 (×2): 3 mL via RESPIRATORY_TRACT
  Filled 2020-12-05 (×4): qty 3

## 2020-12-05 MED ORDER — MOMETASONE FURO-FORMOTEROL FUM 200-5 MCG/ACT IN AERO
2.0000 | INHALATION_SPRAY | Freq: Two times a day (BID) | RESPIRATORY_TRACT | Status: DC
  Administered 2020-12-05 – 2020-12-06 (×2): 2 via RESPIRATORY_TRACT
  Filled 2020-12-05: qty 8.8

## 2020-12-05 MED ORDER — GUAIFENESIN 100 MG/5ML PO LIQD
5.0000 mL | ORAL | Status: DC | PRN
  Filled 2020-12-05: qty 5

## 2020-12-05 MED ORDER — METHYLPREDNISOLONE SODIUM SUCC 40 MG IJ SOLR
40.0000 mg | Freq: Two times a day (BID) | INTRAMUSCULAR | Status: AC
  Administered 2020-12-05 – 2020-12-06 (×2): 40 mg via INTRAVENOUS
  Filled 2020-12-05 (×2): qty 1

## 2020-12-05 MED ORDER — IPRATROPIUM-ALBUTEROL 0.5-2.5 (3) MG/3ML IN SOLN
6.0000 mL | Freq: Once | RESPIRATORY_TRACT | Status: AC
  Administered 2020-12-05: 6 mL via RESPIRATORY_TRACT
  Filled 2020-12-05: qty 6

## 2020-12-05 MED ORDER — ALBUTEROL SULFATE (2.5 MG/3ML) 0.083% IN NEBU: 2.5000 mg | INHALATION_SOLUTION | RESPIRATORY_TRACT | Status: DC | PRN

## 2020-12-05 MED ORDER — SODIUM CHLORIDE 0.9 % IV SOLN
1.0000 g | INTRAVENOUS | Status: DC
  Administered 2020-12-05: 20:00:00 1 g via INTRAVENOUS
  Filled 2020-12-05 (×2): qty 10

## 2020-12-05 MED ORDER — ONDANSETRON HCL 4 MG/2ML IJ SOLN: 4.0000 mg | Freq: Four times a day (QID) | INTRAMUSCULAR | Status: DC | PRN

## 2020-12-05 MED ORDER — ENOXAPARIN SODIUM 80 MG/0.8ML IJ SOSY
0.5000 mg/kg | PREFILLED_SYRINGE | INTRAMUSCULAR | Status: DC
  Administered 2020-12-05: 72.5 mg via SUBCUTANEOUS
  Filled 2020-12-05 (×2): qty 0.72

## 2020-12-05 MED ORDER — PREDNISONE 20 MG PO TABS: 40.0000 mg | ORAL_TABLET | Freq: Every day | ORAL | Status: DC

## 2020-12-05 MED ORDER — MAGNESIUM SULFATE 2 GM/50ML IV SOLN
2.0000 g | Freq: Once | INTRAVENOUS | Status: AC
  Administered 2020-12-05: 2 g via INTRAVENOUS
  Filled 2020-12-05: qty 50

## 2020-12-05 NOTE — ED Triage Notes (Signed)
Pt states she has been having problems with her asthma since she had covid a month ago- for the past week, pt has been having a flair up and her nebs have not been helping- pt also states that she feels like she has a cold- pt states she has coughed up yellow phlem

## 2020-12-05 NOTE — ED Provider Notes (Signed)
[facility] Emergency Department Provider Note   ____________________________________________    I have reviewed the triage vital signs and the nursing notes.   HISTORY  Chief Complaint Asthma     HPI Kelsey Mcclure is a 30 y.o. female with a history of diabetes and asthma who presents with complaints of wheezing and chest tightness.  Patient reports her asthma has been severe over the last several days and worsening.  She thinks that it may have been lung irritation from a substance at [employer] where she works.  She denies fevers or chills.  Occasional cough.  No leg swelling.  Has been using her nebulizer at home with little improvement  Past Medical History:  Diagnosis Date   Asthma    Diabetes mellitus without complication (HCC)    Eczema     Patient Active Problem List   Diagnosis Date Noted   Acute respiratory failure with hypoxia (HCC) 12/15/2019   Type 2 diabetes mellitus with hyperglycemia (HCC) 10/26/2018   Morbid obesity (HCC) 10/26/2018   Depression 10/26/2018   Asthma exacerbation 10/26/2018   Elevated BP without diagnosis of hypertension 10/26/2018   Posterior subcapsular polar cataract, nonsenile 12/08/2017    Past Surgical History:  Procedure Laterality Date   CATARACT EXTRACTION, BILATERAL      Prior to Admission medications   Medication Sig Start Date End Date Taking? Authorizing Provider  albuterol (PROVENTIL) (2.5 MG/3ML) 0.083% nebulizer solution USE 1 VIAL VIA NEBULIZER EVERY 4 HOURS AS NEEDED FOR WHEEZING OR SHORT OF BREATH 12/17/19 05/23/20  Marrion Coy, MD  brompheniramine-pseudoephedrine-DM 30-2-10 MG/5ML syrup Take 5 mLs by mouth 4 (four) times daily as needed. Mix with 5 mL of viscous lidocaine for swish and swallow 10/23/20   Joni Reining, PA-C  budesonide-formoterol [facility]) 160-4.5 MCG/ACT inhaler INHALE 2 PUFFS INTO THE LUNGS EVERY MORNING AND AT BEDTIME 05/15/20 05/15/21  Jene Every, MD   EPINEPHrine (EPIPEN 2-PAK) 0.3 mg/0.3 mL IJ SOAJ injection Inject 0.3 mg into the muscle as needed for anaphylaxis.    [provider]  halobetasol (ULTRAVATE) 0.05 % cream Apply 1 application topically 2 (two) times daily. Apply topically twice daily after showers    [provider]  hydrochlorothiazide (HYDRODIURIL) 25 MG tablet Take 1 tablet (25 mg total) by mouth daily. 11/15/19   Rolly Salter, MD  ibuprofen (ADVIL) 800 MG tablet Take 1 tablet (800 mg total) by mouth once every 8 (eight) hours as needed for moderate pain. 10/23/20   Joni Reining, PA-C  lidocaine (XYLOCAINE) 2 % solution Use as directed 5 mLs in the mouth or throat every 6 (six) hours as needed for mouth pain. Mix with 5 mL of Bromfed-DM for swish and swallow 10/23/20   Joni Reining, PA-C  montelukast (SINGULAIR) 10 MG tablet Take 10 mg by mouth at bedtime.    [provider]  naproxen sodium (ALEVE) 220 MG tablet Take 440 mg by mouth. Take 2 tabs (440 mg total) by mouth as needed for pain.    [provider]  predniSONE (DELTASONE) 10 MG tablet Take 5 tablets (50 mg total) by mouth daily with breakfast for 4 days. 05/15/20   Jene Every, MD     Allergies Bee venom, Peanut oil, and Shellfish allergy  Family History  Problem Relation Age of Onset   Eczema Mother    Diabetes Father    Psoriasis Father    Eczema Father    Sleep apnea Father  Asthma Sister    Eczema Sister    Eczema Sister     Social History Social History   Tobacco Use   Smoking status: Former    Packs/day: 0.50    Types: Cigarettes    Quit date: 04/16/2019    Years since quitting: 1.6   Smokeless tobacco: Never  Vaping Use   Vaping Use: Never used  Substance Use Topics   Alcohol use: Yes    Alcohol/week: 4.0 standard drinks    Types: 4 Shots of liquor per week   Drug use: Yes    Types: Marijuana    Review of Systems  Constitutional: No fever/chills Eyes: No visual changes.  ENT: No sore  throat. Cardiovascular: Denies chest pain. Respiratory: As above Gastrointestinal: No abdominal pain.  No nausea, no vomiting.   Genitourinary: Negative for dysuria. Musculoskeletal: Negative for back pain. Skin: Negative for rash. Neurological: Negative for headaches    ____________________________________________   PHYSICAL EXAM:  VITAL SIGNS: ED Triage Vitals  Enc Vitals Group     BP 12/05/20 0814 (!) 144/97     Pulse Rate 12/05/20 0814 93     Resp 12/05/20 0814 18     Temp 12/05/20 0814 98 F (36.7 C)     Temp Source 12/05/20 0814 Oral     SpO2 12/05/20 0814 (!) 88 %     Weight 12/05/20 0813 (!) 145.2 kg (320 lb)     Height 12/05/20 0813 1.6 m (5\' 3" )     Head Circumference --      Peak Flow --      Pain Score 12/05/20 0811 0     Pain Loc --      Pain Edu? --      Excl. in GC? --     Constitutional: Alert and oriented.  Eyes: Conjunctivae are normal.   Nose: No congestion/rhinnorhea.  Cardiovascular: Normal rate, regular rhythm. Grossly normal heart sounds.  Good peripheral circulation. Respiratory: Increased respiratory effort with mild tachypnea no retractions.  Diffuse wheezing Gastrointestinal: Soft and nontender. No distention.  No CVA tenderness.  Musculoskeletal: No lower extremity tenderness nor edema.  Warm and well perfused Neurologic:  Normal speech and language. No gross focal neurologic deficits are appreciated.  Skin:  Skin is warm, dry and intact. No rash noted. Psychiatric: Mood and affect are normal. Speech and behavior are normal.  ____________________________________________   LABS (all labs ordered are listed, but only abnormal results are displayed)  Labs Reviewed  RESP PANEL BY RT-PCR (FLU A&B, COVID) ARPGX2  CBC  COMPREHENSIVE METABOLIC PANEL   ____________________________________________  EKG Chest x-ray read by me, no evidence of  pneumonia ____________________________________________  RADIOLOGY   ____________________________________________   PROCEDURES  Procedure(s) performed: No  Procedures   Critical Care performed: yes  CRITICAL CARE Performed by: 13/12/22   Total critical care time: 30 minutes  Critical care time was exclusive of separately billable procedures and treating other patients.  Critical care was necessary to treat or prevent imminent or life-threatening deterioration.  Critical care was time spent personally by me on the following activities: development of treatment plan with patient and/or surrogate as well as nursing, discussions with consultants, evaluation of patient's response to treatment, examination of patient, obtaining history from patient or surrogate, ordering and performing treatments and interventions, ordering and review of laboratory studies, ordering and review of radiographic studies, pulse oximetry and re-evaluation of patient's condition.  ____________________________________________   INITIAL IMPRESSION / ASSESSMENT AND PLAN / ED COURSE  Pertinent labs & imaging results that were available during my care of the patient were reviewed by me and considered in my medical decision making (see chart for details).   Patient with a history of asthma presents with shortness of breath and wheezing.  Found to have oxygen saturations of 88% on room air, DuoNebs ordered.  IV Solu-Medrol, IV magnesium  Chest x-ray is reassuring, not consistent with pneumonia.  After multiple DuoNeb's magnesium and Solu-Medrol patient remains with significant wheezing and increased work of breathing.  Oxygen saturations 89 to 90%  Lab work pending  Patient will require admission, discussed with Dr. Joylene Igo of the hospitalist service    ____________________________________________   FINAL CLINICAL IMPRESSION(S) / ED DIAGNOSES  Final diagnoses:  Severe persistent asthma with  exacerbation        Note:  This document was prepared using Dragon voice recognition software and may include unintentional dictation errors.    Jene Every, MD 12/05/20 1455

## 2020-12-05 NOTE — ED Notes (Signed)
Informed RN bed assigned 

## 2020-12-05 NOTE — H&P (Signed)
History and Physical    Kelsey Mcclure [id number] DOB: October 20, 1990 DOA: 12/05/2020  PCP: Patient, No Pcp Per (Inactive)   Patient coming from: Home  I have personally briefly reviewed patient's old medical records in [place]  Chief Complaint:   HPI: Kelsey Mcclure is a 30 y.o. female with medical history significant for asthma, eczema, diabetes mellitus and morbid obesity who presents to the emergency room for evaluation of shortness of breath which she has had for the past week associated with a cough productive of yellow phlegm and wheezing. Patient states that she was diagnosed with COVID-19 viral infection about a month ago and since then she has had progressively worsening shortness of breath associated with a cough productive of yellow phlegm, nasal congestion and wheezing.  She has had to use her nebulizer treatment more frequently without significant or sustained improvement. She denies having any sick contacts.  She denies having any chest pain, no nausea, no vomiting, no dizziness, no lightheadedness, no abdominal pain, no changes in her bowel habits, no headache, no blurred vision, no focal deficit, no weakness, no urinary symptoms, no lower extremity swelling. Labs show white count 10.9, hemoglobin 11.9, hematocrit 30.7, MCV 82.5, RDW 14.9, platelet count 377 Respiratory viral panel is negative Chest x-ray reviewed by me shows no evidence of acute cardiopulmonary disease.  ED Course: Patient is a morbidly obese 30 year old female with a history of asthma who presents to the emergency room for evaluation of worsening shortness of breath, cough productive of yellow phlegm and wheezing.  She had COVID-19 viral infection about a month ago and states that since then she has had shortness of breath, cough and nasal congestion. She was treated in the ER for acute asthma exacerbation without any improvement in her symptoms and will be referred to observation status for  further evaluation.    Review of Systems: As per HPI otherwise all other systems reviewed and negative.    Past Medical History:  Diagnosis Date   Asthma    Diabetes mellitus without complication ([place])    Eczema     Past Surgical History:  Procedure Laterality Date   CATARACT EXTRACTION, BILATERAL       reports that she quit smoking about 19 months ago. Her smoking use included cigarettes. She smoked an average of .5 packs per day. She has never used smokeless tobacco. She reports current alcohol use of about 4.0 standard drinks per week. She reports current drug use. Drug: Marijuana.  Allergies  Allergen Reactions   Bee Venom Anaphylaxis   Peanut Oil Anaphylaxis   Shellfish Allergy Hives    Family History  Problem Relation Age of Onset   Eczema Mother    Diabetes Father    Psoriasis Father    Eczema Father    Sleep apnea Father    Asthma Sister    Eczema Sister    Eczema Sister       Prior to Admission medications   Medication Sig Start Date End Date Taking? Authorizing Provider  albuterol (PROVENTIL) (2.5 MG/3ML) 0.083% nebulizer solution USE 1 VIAL VIA NEBULIZER EVERY 4 HOURS AS NEEDED FOR WHEEZING OR SHORT OF BREATH 12/17/19 05/23/20  Sharen Hones, MD  brompheniramine-pseudoephedrine-DM 30-2-10 MG/5ML syrup Take 5 mLs by mouth 4 (four) times daily as needed. Mix with 5 mL of viscous lidocaine for swish and swallow 10/23/20   Sable Feil, PA-C  budesonide-formoterol [facility]) 160-4.5 MCG/ACT inhaler INHALE 2 PUFFS INTO THE LUNGS EVERY MORNING AND AT BEDTIME 05/15/20 05/15/21  Jene Every, MD  EPINEPHrine (EPIPEN 2-PAK) 0.3 mg/0.3 mL IJ SOAJ injection Inject 0.3 mg into the muscle as needed for anaphylaxis.    [provider]  halobetasol (ULTRAVATE) 0.05 % cream Apply 1 application topically 2 (two) times daily. Apply topically twice daily after showers    [provider]  hydrochlorothiazide (HYDRODIURIL) 25 MG tablet Take 1 tablet (25 mg  total) by mouth daily. 11/15/19   Rolly Salter, MD  ibuprofen (ADVIL) 800 MG tablet Take 1 tablet (800 mg total) by mouth once every 8 (eight) hours as needed for moderate pain. 10/23/20   Joni Reining, PA-C  lidocaine (XYLOCAINE) 2 % solution Use as directed 5 mLs in the mouth or throat every 6 (six) hours as needed for mouth pain. Mix with 5 mL of Bromfed-DM for swish and swallow 10/23/20   Joni Reining, PA-C  montelukast (SINGULAIR) 10 MG tablet Take 10 mg by mouth at bedtime.    [provider]  naproxen sodium (ALEVE) 220 MG tablet Take 440 mg by mouth. Take 2 tabs (440 mg total) by mouth as needed for pain.    [provider]  predniSONE (DELTASONE) 10 MG tablet Take 5 tablets (50 mg total) by mouth daily with breakfast for 4 days. 05/15/20   Jene Every, MD    Physical Exam: Vitals:   12/05/20 0814 12/05/20 0837 12/05/20 1511 12/05/20 1512  BP: (!) 144/97  126/81 126/81  Pulse: 93  80 88  Resp: 18   20  Temp: 98 F (36.7 C)     TempSrc: Oral     SpO2: (!) 88% 90%  91%  Weight:      Height:         Vitals:   12/05/20 0814 12/05/20 0837 12/05/20 1511 12/05/20 1512  BP: (!) 144/97  126/81 126/81  Pulse: 93  80 88  Resp: 18   20  Temp: 98 F (36.7 C)     TempSrc: Oral     SpO2: (!) 88% 90%  91%  Weight:      Height:          Constitutional: Alert and oriented x 3 . Not in any apparent distress.  Morbidly obese HEENT:      Head: Normocephalic and atraumatic.         Eyes: PERLA, EOMI, Conjunctivae are normal. Sclera is non-icteric.       Mouth/Throat: Mucous membranes are moist.       Neck: Supple with no signs of meningismus. Cardiovascular: Regular rate and rhythm. No murmurs, gallops, or rubs. 2+ symmetrical distal pulses are present . No JVD. No LE edema Respiratory: Respiratory effort normal.  Diffuse expiratory wheezes. No  crackles, or rhonchi.  Gastrointestinal: Soft, non tender, and non distended with positive bowel sounds.   Genitourinary: No CVA tenderness. Musculoskeletal: Nontender with normal range of motion in all extremities. No cyanosis, or erythema of extremities. Neurologic:  Face is symmetric. Moving all extremities. No gross focal neurologic deficits . Skin: Skin is warm, dry.  No rash or ulcers Psychiatric: Mood and affect are normal    Labs on Admission: I have personally reviewed following labs and imaging studies  CBC: Recent Labs  Lab 12/05/20 1511  WBC 10.9*  HGB 11.9*  HCT 38.7  MCV 82.5  PLT 377   Basic Metabolic Panel: Recent Labs  Lab 12/05/20 1511  NA 138  K 4.0  CL 103  CO2 30  GLUCOSE 125*  BUN 9  CREATININE 0.69  CALCIUM 8.8*   GFR: Estimated Creatinine Clearance: 142.6 mL/min (by C-G formula based on SCr of 0.69 mg/dL). Liver Function Tests: Recent Labs  Lab 12/05/20 1511  AST 24  ALT 23  ALKPHOS 72  BILITOT 0.6  PROT 8.2*  ALBUMIN 3.8   No results for input(s): LIPASE, AMYLASE in the last 168 hours. No results for input(s): AMMONIA in the last 168 hours. Coagulation Profile: No results for input(s): INR, PROTIME in the last 168 hours. Cardiac Enzymes: No results for input(s): CKTOTAL, CKMB, CKMBINDEX, TROPONINI in the last 168 hours. BNP (last 3 results) No results for input(s): PROBNP in the last 8760 hours. HbA1C: No results for input(s): HGBA1C in the last 72 hours. CBG: No results for input(s): GLUCAP in the last 168 hours. Lipid Profile: No results for input(s): CHOL, HDL, LDLCALC, TRIG, CHOLHDL, LDLDIRECT in the last 72 hours. Thyroid Function Tests: No results for input(s): TSH, T4TOTAL, FREET4, T3FREE, THYROIDAB in the last 72 hours. Anemia Panel: No results for input(s): VITAMINB12, FOLATE, FERRITIN, TIBC, IRON, RETICCTPCT in the last 72 hours. Urine analysis:    Component Value Date/Time   COLORURINE YELLOW (A) 07/01/2020 2159   APPEARANCEUR CLEAR (A) 07/01/2020 2159   LABSPEC 1.020 07/01/2020 2159   PHURINE 5.0 07/01/2020 2159    GLUCOSEU NEGATIVE 07/01/2020 2159   HGBUR NEGATIVE 07/01/2020 2159   BILIRUBINUR NEGATIVE 07/01/2020 2159   KETONESUR NEGATIVE 07/01/2020 2159   PROTEINUR NEGATIVE 07/01/2020 2159   NITRITE NEGATIVE 07/01/2020 2159   LEUKOCYTESUR NEGATIVE 07/01/2020 2159    Radiological Exams on Admission: DG Chest 2 View  Result Date: 12/05/2020 CLINICAL DATA:  30 year old female with history of asthma exacerbations since COVID infection 1 month ago. EXAM: CHEST - 2 VIEW COMPARISON:  Chest x-ray 12/15/2019. FINDINGS: Lung volumes are normal. No consolidative airspace disease. No pleural effusions. No pneumothorax. No pulmonary nodule or mass noted. Pulmonary vasculature and the cardiomediastinal silhouette are within normal limits. IMPRESSION: No radiographic evidence of acute cardiopulmonary disease. Electronically Signed   By: Vinnie Langton M.D.   On: 12/05/2020 09:50     Assessment/Plan Principal Problem:   Asthma exacerbation Active Problems:   Morbid obesity (HCC)   Depression   Elevated BP without diagnosis of hypertension   Diabetes mellitus without complication [facility])      Patient is a 30 year old morbidly obese female admitted to the hospital for acute asthma exacerbation.    Asthma exacerbation Moderate persistent asthma Place patient on scheduled and as needed bronchodilator therapy Place patient on systemic steroids Continue inhaled steroids Place patient on Rocephin 1 g IV daily to complete a 5-day course of therapy     Morbid obesity (BMI [id number]) Complicates overall prognosis and care   Hypertension Continue hydrochlorothiazide    Diabetes mellitus Diet controlled per patient Expect hyperglycemia with systemic steroid administration Maintain consistent carbohydrate diet Blood sugar checks with meals and if elevated will start patient on sliding scale insulin   DVT prophylaxis: Lovenox  Code Status: full code  Family Communication: Greater than 50% of time was  spent discussing patient's condition and plan of care with her at the bedside.  All questions and concerns have been addressed.  She verbalizes understanding and agrees to the plan. Disposition Plan: Back to previous home environment Consults called: none  Status:Observation    Campbell Kray MD [facility]     12/05/2020, 3:41 PM

## 2020-12-05 NOTE — Consult Note (Signed)
PHARMACIST - PHYSICIAN COMMUNICATION  CONCERNING:  Enoxaparin (Lovenox) for DVT Prophylaxis    RECOMMENDATION: Patient was prescribed enoxaprin 40mg  q24 hours for VTE prophylaxis.   Filed Weights   12/05/20 0813  Weight: (!) 145.2 kg (320 lb)    Body mass index is 56.69 kg/m.  Estimated Creatinine Clearance: 142.6 mL/min (by C-G formula based on SCr of 0.69 mg/dL).   Based on [facility] policy patient is candidate for enoxaparin 0.5mg /kg TBW SQ every 24 hours based on BMI being >30.  DESCRIPTION: Pharmacy has adjusted enoxaparin dose per [facility] policy.  Patient is now receiving enoxaparin 72.5 mg every 24 hours    [facility], PharmD Clinical Pharmacist  12/05/2020 3:49 PM

## 2020-12-06 DIAGNOSIS — J4541 Moderate persistent asthma with (acute) exacerbation: Secondary | ICD-10-CM | POA: Diagnosis not present

## 2020-12-06 DIAGNOSIS — I10 Essential (primary) hypertension: Secondary | ICD-10-CM

## 2020-12-06 DIAGNOSIS — E119 Type 2 diabetes mellitus without complications: Secondary | ICD-10-CM | POA: Diagnosis not present

## 2020-12-06 LAB — CBC
HCT: 40.7 % (ref 36.0–46.0)
Hemoglobin: 12.8 g/dL (ref 12.0–15.0)
MCH: 25.7 pg — ABNORMAL LOW (ref 26.0–34.0)
MCHC: 31.4 g/dL (ref 30.0–36.0)
MCV: 81.6 fL (ref 80.0–100.0)
Platelets: 396 10*3/uL (ref 150–400)
RBC: 4.99 MIL/uL (ref 3.87–5.11)
RDW: 14.7 % (ref 11.5–15.5)
WBC: 12.4 10*3/uL — ABNORMAL HIGH (ref 4.0–10.5)
nRBC: 0 % (ref 0.0–0.2)

## 2020-12-06 LAB — BASIC METABOLIC PANEL
Anion gap: 8 (ref 5–15)
BUN: 9 mg/dL (ref 6–20)
CO2: 28 mmol/L (ref 22–32)
Calcium: 9.4 mg/dL (ref 8.9–10.3)
Chloride: 101 mmol/L (ref 98–111)
Creatinine, Ser: 0.79 mg/dL (ref 0.44–1.00)
GFR, Estimated: >60 mL/min (ref >60)
Glucose, Bld: 191 mg/dL — ABNORMAL HIGH (ref 70–99)
Potassium: 4.6 mmol/L (ref 3.5–5.1)
Sodium: 137 mmol/L (ref 135–145)

## 2020-12-06 LAB — GLUCOSE, CAPILLARY
Glucose-Capillary: 173 mg/dL — ABNORMAL HIGH (ref 70–99)
Glucose-Capillary: 147 mg/dL — ABNORMAL HIGH (ref 70–99)

## 2020-12-06 LAB — HIV ANTIBODY (ROUTINE TESTING W REFLEX): HIV Screen 4th Generation wRfx: NONREACTIVE

## 2020-12-06 MED ORDER — FAMOTIDINE 20 MG PO TABS: 20.0000 mg | ORAL_TABLET | Freq: Every day | ORAL | 0 refills | Status: DC

## 2020-12-06 MED ORDER — SODIUM CHLORIDE 0.9 % IV SOLN
500.0000 mg | INTRAVENOUS | Status: DC
  Administered 2020-12-06: 10:00:00 500 mg via INTRAVENOUS
  Filled 2020-12-06: qty 500

## 2020-12-06 MED ORDER — AMOXICILLIN-POT CLAVULANATE 875-125 MG PO TABS: 1.0000 | ORAL_TABLET | Freq: Two times a day (BID) | ORAL | 0 refills | Status: AC

## 2020-12-06 MED ORDER — PREDNISONE 20 MG PO TABS: 40.0000 mg | ORAL_TABLET | Freq: Every day | ORAL | 0 refills | Status: AC

## 2020-12-06 MED ORDER — GUAIFENESIN 100 MG/5ML PO LIQD: 5.0000 mL | ORAL | 0 refills | Status: AC | PRN

## 2020-12-06 NOTE — Discharge Summary (Signed)
Kelsey Mcclure EGB:[id number] DOB: 10-Feb-1990 DOA: 12/05/2020  PCP: Patient, No Pcp Per (Inactive)  Admit date: 12/05/2020 Discharge date: 12/06/2020  Admitted From: Home Disposition: Home  Recommendations for Outpatient Follow-up:  Follow up with PCP in 1 week Please obtain BMP/CBC in one week     Discharge Condition:Stable CODE STATUS: Full Diet recommendation: Carb modified  Brief/Interim Summary: Per PXT:GGYIRSWNI Kelsey Mcclure is a 30 y.o. female with medical history significant for asthma, eczema, diabetes mellitus and morbid obesity who presents to the emergency room for evaluation of shortness of breath which she has had for the past week associated with a cough productive of yellow phlegm and wheezing. Patient states that she was diagnosed with COVID-19 viral infection about a month ago and since then she has had progressively worsening shortness of breath associated with a cough productive of yellow phlegm, nasal congestion and wheezing.  She has had to use her nebulizer treatment more frequently without significant or sustained improvement.  Patient was admitted for acute asthma exacerbation.  She was treated with IV antibiotics and IV steroid.  This a.m. she reports ambulating without dyspnea on exertion.  Feels better and even took a shower without any issues.  Excited to go home.  She was discharged home and she is stable.  Work excuse note was given.   Asthma exacerbation Moderate persistent asthma Treated IV steroid and antibiotics Discharge with po antibiotics and steroid. Continue home inhalers F/u with pcp when established Currently on room air           Morbid obesity (BMI 56.6) Complicates overall prognosis and care     Hypertension Continue hydrochlorothiazide       Diabetes mellitus Diet controlled per patient Will need to f/u with pcp for further management    Discharge Diagnoses:  Principal Problem:   Asthma exacerbation Active Problems:    Morbid obesity (HCC)   Depression   Elevated BP without diagnosis of hypertension   Diabetes mellitus without complication [facility])    Discharge Instructions  Discharge Instructions     Call MD for:  difficulty breathing, headache or visual disturbances   Complete by: As directed    Diet - low sodium heart healthy   Complete by: As directed    Diet general   Complete by: As directed    Discharge instructions   Complete by: As directed    Follow up with pcp once established   Increase activity slowly   Complete by: As directed       Allergies as of 12/06/2020       Reactions   Bee Venom Anaphylaxis   Peanut Oil Anaphylaxis   Shellfish Allergy Hives        Medication List     STOP taking these medications    brompheniramine-pseudoephedrine-DM 30-2-10 MG/5ML syrup   ibuprofen 800 MG tablet Commonly known as: ADVIL   lidocaine 2 % solution Commonly known as: XYLOCAINE   naproxen sodium 220 MG tablet Commonly known as: ALEVE       TAKE these medications    albuterol 108 (90 Base) MCG/ACT inhaler Commonly known as: VENTOLIN HFA Inhale 2 puffs into the lungs every 4 (four) hours as needed for shortness of breath or wheezing. What changed: Another medication with the same name was removed. Continue taking this medication, and follow the directions you see here.   amoxicillin-clavulanate 875-125 MG tablet Commonly known as: Augmentin Take 1 tablet by mouth every 12 (twelve) hours for 5 days.   budesonide-formoterol 160-4.5 MCG/ACT  inhaler Commonly known as: SYMBICORT INHALE 2 PUFFS INTO THE LUNGS EVERY MORNING AND AT BEDTIME   EPINEPHrine 0.3 mg/0.3 mL Soaj injection Commonly known as: EPI-PEN Inject 0.3 mg into the muscle as needed for anaphylaxis.   famotidine 20 MG tablet Commonly known as: PEPCID Take 1 tablet (20 mg total) by mouth daily for 14 days.   guaiFENesin 100 MG/5ML liquid Commonly known as: ROBITUSSIN Take 5 mLs by mouth every 4 (four)  hours as needed for up to 14 days for cough or to loosen phlegm.   halobetasol 0.05 % cream Commonly known as: ULTRAVATE Apply 1 application topically 2 (two) times daily. Apply topically twice daily after showers   hydrochlorothiazide 25 MG tablet Commonly known as: HYDRODIURIL Take 1 tablet (25 mg total) by mouth daily.   montelukast 10 MG tablet Commonly known as: SINGULAIR Take 10 mg by mouth at bedtime.   predniSONE 20 MG tablet Commonly known as: DELTASONE Take 2 tablets (40 mg total) by mouth daily with breakfast for 4 days. Start taking on: December 07, 2020 What changed:  medication strength how much to take        Allergies  Allergen Reactions   Bee Venom Anaphylaxis   Peanut Oil Anaphylaxis   Shellfish Allergy Hives    Consultations:    Procedures/Studies: DG Chest 2 View  Result Date: 12/05/2020 CLINICAL DATA:  30 year old female with history of asthma exacerbations since COVID infection 1 month ago. EXAM: CHEST - 2 VIEW COMPARISON:  Chest x-ray 12/15/2019. FINDINGS: Lung volumes are normal. No consolidative airspace disease. No pleural effusions. No pneumothorax. No pulmonary nodule or mass noted. Pulmonary vasculature and the cardiomediastinal silhouette are within normal limits. IMPRESSION: No radiographic evidence of acute cardiopulmonary disease. Electronically Signed   By: Trudie Reed M.D.   On: 12/05/2020 09:50      Subjective: No wheezing.  No dyspnea on exertion or shortness of breath.  No chest pain.  Discharge Exam: Vitals:   12/06/20 0554 12/06/20 0751  BP: (!) 141/99 123/71  Pulse: 81 97  Resp: 16 17  Temp: 97.8 F (36.6 C)   SpO2: 94% 95%   Vitals:   12/05/20 2043 12/06/20 0042 12/06/20 0554 12/06/20 0751  BP: (!) 137/96 (!) 137/92 (!) 141/99 123/71  Pulse: 93 92 81 97  Resp: 16 18 16 17   Temp: 97.9 F (36.6 C) 97.8 F (36.6 C) 97.8 F (36.6 C)   TempSrc: Oral Oral Oral   SpO2: 92% 100% 94% 95%  Weight:      Height:         General: Pt is alert, awake, not in acute distress Cardiovascular: RRR, S1/S2 +, no rubs, no gallops Respiratory: CTA bilaterally, no wheezing, no rhonchi Abdominal: Soft, NT, ND, bowel sounds + Extremities: no edema, no cyanosis    The results of significant diagnostics from this hospitalization (including imaging, microbiology, ancillary and laboratory) are listed below for reference.     Microbiology: Recent Results (from the past 240 hour(s))  Resp Panel by RT-PCR (Flu A&B, Covid) Nasopharyngeal Swab     Status: None   Collection Time: 12/05/20  3:11 PM   Specimen: Nasopharyngeal Swab; Nasopharyngeal(NP) swabs in vial transport medium  Result Value Ref Range Status   SARS Coronavirus 2 by RT PCR NEGATIVE NEGATIVE Final    Comment: (NOTE) SARS-CoV-2 target nucleic acids are NOT DETECTED.  The SARS-CoV-2 RNA is generally detectable in upper respiratory specimens during the acute phase of infection. The lowest concentration of SARS-CoV-2  viral copies this assay can detect is 138 copies/mL. A negative result does not preclude SARS-Cov-2 infection and should not be used as the sole basis for treatment or other patient management decisions. A negative result may occur with  improper specimen collection/handling, submission of specimen other than nasopharyngeal swab, presence of viral mutation(s) within the areas targeted by this assay, and inadequate number of viral copies(<138 copies/mL). A negative result must be combined with clinical observations, patient history, and epidemiological information. The expected result is Negative.  Fact Sheet for Patients:  BloggerCourse.com  Fact Sheet for Healthcare Providers:  SeriousBroker.it  This test is no t yet approved or cleared by the [place] FDA and  has been authorized for detection and/or diagnosis of SARS-CoV-2 by FDA under an Emergency Use Authorization (EUA).  This EUA will remain  in effect (meaning this test can be used) for the duration of the COVID-19 declaration under Section 564(b)(1) of the Act, 21 U.S.C.section 360bbb-3(b)(1), unless the authorization is terminated  or revoked sooner.       Influenza A by PCR NEGATIVE NEGATIVE Final   Influenza B by PCR NEGATIVE NEGATIVE Final    Comment: (NOTE) The Xpert Xpress SARS-CoV-2/FLU/RSV plus assay is intended as an aid in the diagnosis of influenza from Nasopharyngeal swab specimens and should not be used as a sole basis for treatment. Nasal washings and aspirates are unacceptable for Xpert Xpress SARS-CoV-2/FLU/RSV testing.  Fact Sheet for Patients: BloggerCourse.com  Fact Sheet for Healthcare Providers: SeriousBroker.it  This test is not yet approved or cleared by the [place] FDA and has been authorized for detection and/or diagnosis of SARS-CoV-2 by FDA under an Emergency Use Authorization (EUA). This EUA will remain in effect (meaning this test can be used) for the duration of the COVID-19 declaration under Section 564(b)(1) of the Act, 21 U.S.C. section 360bbb-3(b)(1), unless the authorization is terminated or revoked.  Performed at [facility], [address] [place], [place] [address]      Labs: BNP (last 3 results) No results for input(s): BNP in the last 8760 hours. Basic Metabolic Panel: Recent Labs  Lab 12/05/20 1511 12/06/20 0429  NA 138 137  K 4.0 4.6  CL 103 101  CO2 30 28  GLUCOSE 125* 191*  BUN 9 9  CREATININE 0.69 0.79  CALCIUM 8.8* 9.4   Liver Function Tests: Recent Labs  Lab 12/05/20 1511  AST 24  ALT 23  ALKPHOS 72  BILITOT 0.6  PROT 8.2*  ALBUMIN 3.8   No results for input(s): LIPASE, AMYLASE in the last 168 hours. No results for input(s): AMMONIA in the last 168 hours. CBC: Recent Labs  Lab 12/05/20 1511 12/06/20 0429  WBC 10.9* 12.4*  HGB 11.9* 12.8  HCT 38.7  40.7  MCV 82.5 81.6  PLT 377 396   Cardiac Enzymes: No results for input(s): CKTOTAL, CKMB, CKMBINDEX, TROPONINI in the last 168 hours. BNP: Invalid input(s): POCBNP CBG: Recent Labs  Lab 12/06/20 0554 12/06/20 0752  GLUCAP 173* 147*   D-Dimer No results for input(s): DDIMER in the last 72 hours. Hgb A1c No results for input(s): HGBA1C in the last 72 hours. Lipid Profile No results for input(s): CHOL, HDL, LDLCALC, TRIG, CHOLHDL, LDLDIRECT in the last 72 hours. Thyroid function studies No results for input(s): TSH, T4TOTAL, T3FREE, THYROIDAB in the last 72 hours.  Invalid input(s): FREET3 Anemia work up No results for input(s): VITAMINB12, FOLATE, FERRITIN, TIBC, IRON, RETICCTPCT in the last 72 hours. Urinalysis  Component Value Date/Time   COLORURINE YELLOW (A) 07/01/2020 2159   APPEARANCEUR CLEAR (A) 07/01/2020 2159   LABSPEC 1.020 07/01/2020 2159   PHURINE 5.0 07/01/2020 2159   GLUCOSEU NEGATIVE 07/01/2020 2159   HGBUR NEGATIVE 07/01/2020 2159   BILIRUBINUR NEGATIVE 07/01/2020 2159   KETONESUR NEGATIVE 07/01/2020 2159   PROTEINUR NEGATIVE 07/01/2020 2159   NITRITE NEGATIVE 07/01/2020 2159   LEUKOCYTESUR NEGATIVE 07/01/2020 2159   Sepsis Labs Invalid input(s): PROCALCITONIN,  WBC,  LACTICIDVEN Microbiology Recent Results (from the past 240 hour(s))  Resp Panel by RT-PCR (Flu A&B, Covid) Nasopharyngeal Swab     Status: None   Collection Time: 12/05/20  3:11 PM   Specimen: Nasopharyngeal Swab; Nasopharyngeal(NP) swabs in vial transport medium  Result Value Ref Range Status   SARS Coronavirus 2 by RT PCR NEGATIVE NEGATIVE Final    Comment: (NOTE) SARS-CoV-2 target nucleic acids are NOT DETECTED.  The SARS-CoV-2 RNA is generally detectable in upper respiratory specimens during the acute phase of infection. The lowest concentration of SARS-CoV-2 viral copies this assay can detect is 138 copies/mL. A negative result does not preclude SARS-Cov-2 infection and  should not be used as the sole basis for treatment or other patient management decisions. A negative result may occur with  improper specimen collection/handling, submission of specimen other than nasopharyngeal swab, presence of viral mutation(s) within the areas targeted by this assay, and inadequate number of viral copies(<138 copies/mL). A negative result must be combined with clinical observations, patient history, and epidemiological information. The expected result is Negative.  Fact Sheet for Patients:  BloggerCourse.com  Fact Sheet for Healthcare Providers:  SeriousBroker.it  This test is no t yet approved or cleared by the [place] FDA and  has been authorized for detection and/or diagnosis of SARS-CoV-2 by FDA under an Emergency Use Authorization (EUA). This EUA will remain  in effect (meaning this test can be used) for the duration of the COVID-19 declaration under Section 564(b)(1) of the Act, 21 U.S.C.section 360bbb-3(b)(1), unless the authorization is terminated  or revoked sooner.       Influenza A by PCR NEGATIVE NEGATIVE Final   Influenza B by PCR NEGATIVE NEGATIVE Final    Comment: (NOTE) The Xpert Xpress SARS-CoV-2/FLU/RSV plus assay is intended as an aid in the diagnosis of influenza from Nasopharyngeal swab specimens and should not be used as a sole basis for treatment. Nasal washings and aspirates are unacceptable for Xpert Xpress SARS-CoV-2/FLU/RSV testing.  Fact Sheet for Patients: BloggerCourse.com  Fact Sheet for Healthcare Providers: SeriousBroker.it  This test is not yet approved or cleared by the [place] FDA and has been authorized for detection and/or diagnosis of SARS-CoV-2 by FDA under an Emergency Use Authorization (EUA). This EUA will remain in effect (meaning this test can be used) for the duration of the COVID-19 declaration  under Section 564(b)(1) of the Act, 21 U.S.C. section 360bbb-3(b)(1), unless the authorization is terminated or revoked.  Performed at [facility], [address], [place], [place] [address]      Time coordinating discharge: Over 30 minutes  SIGNED:   Lynn Ito, MD  [facility] 12/06/2020, 10:54 AM Pager   If 7PM-7AM, please contact night-coverage [email or web address] Password [email or web address]

## 2020-12-06 NOTE — TOC Transition Note (Signed)
Transition of Care [facility]) - CM/SW Discharge Note   Patient Details  Name: Kelsey Mcclure MRN: [id number] Date of Birth: 1990/07/26  Transition of Care [facility]) CM/SW Contact:  Bing Quarry, RN Phone Number: 12/06/2020, 11:36 AM   Clinical Narrative: Patient OBS status. DC today, no PCP. Notified Unit RN to please tell patient to try to follow up at [facility] if possible. Has insurance. Gabriel Cirri RN CM      Final next level of care: Home/Self Care Barriers to Discharge: Barriers Resolved   Patient Goals and CMS Choice        Discharge Placement                       Discharge Plan and Services                DME Arranged: N/A DME Agency: NA       HH Arranged: NA HH Agency: NA        Social Determinants of Health (SDOH) Interventions     Readmission Risk Interventions No flowsheet data found.

## 2021-01-06 ENCOUNTER — Ambulatory Visit: Payer: 59

## 2021-01-06 ENCOUNTER — Other Ambulatory Visit: Payer: Self-pay

## 2021-01-06 ENCOUNTER — Ambulatory Visit
Admission: RE | Admit: 2021-01-06 | Discharge: 2021-01-06 | Disposition: A | Discharge status: Home / Self Care | Payer: 59 | Source: Ambulatory Visit | Attending: Medical Oncology | Admitting: Medical Oncology

## 2021-01-06 VITALS — BP 140/96 | HR 84 | Temp 98.9°F | Resp 18

## 2021-01-06 DIAGNOSIS — J4541 Moderate persistent asthma with (acute) exacerbation: Secondary | ICD-10-CM

## 2021-01-06 DIAGNOSIS — R0602 Shortness of breath: Secondary | ICD-10-CM

## 2021-01-06 MED ORDER — ALBUTEROL SULFATE HFA 108 (90 BASE) MCG/ACT IN AERS: 2.0000 | INHALATION_SPRAY | RESPIRATORY_TRACT | 0 refills | Status: DC | PRN

## 2021-01-06 MED ORDER — PREDNISONE 10 MG PO TABS: ORAL_TABLET | ORAL | 0 refills | Status: DC

## 2021-01-06 MED ORDER — ALBUTEROL SULFATE (5 MG/ML) 0.5% IN NEBU: 2.5000 mg | INHALATION_SOLUTION | Freq: Four times a day (QID) | RESPIRATORY_TRACT | 12 refills | Status: DC | PRN

## 2021-01-06 NOTE — Discharge Instructions (Addendum)
You may want to schedule a visit with a Pulmonologist (lung specialist)

## 2021-01-06 NOTE — ED Triage Notes (Signed)
Pt here with frequent asthma exacerbations and is needing a refill on nebulizer medication.

## 2021-01-06 NOTE — ED Provider Notes (Signed)
[name]    CSN: [id number] Arrival date & time: 01/06/21  1519      History   Chief Complaint Chief Complaint  Patient presents with   Medication Refill   Asthma    HPI Kelsey Mcclure is a 30 y.o. female.   HPI  Cough: Pt who has a history of moderate persistent asthma states that since she had COVID-19 about 2 months ago her asthma medications have not been as helpful. She currently is using albuterol nebulizer PRN about 4-5 times per day along with montelukast. She was previously on Symbicort but ran out of this medication a while ago and she reports that her insurance does not currently cover it but this will change in January. She denies recent fever, any chest pain or hemoptysis.She was seen in the ER 2 weeks ago where she had normal imaging and was started on steroids after they checked her glucose values. She reports that her glucose values have been well controlled. She also reports that the steroids made a significant difference in her symptoms- she reports that she feels "10 x better now" than she did. She asks for a refill of her albuterol rescue inhaler and nebulizer solution along with one round of prednisone to help get her through until she can get her symbicort.  Past Medical History:  Diagnosis Date   Asthma    Diabetes mellitus without complication ([place])    Eczema     Patient Active Problem List   Diagnosis Date Noted   Diabetes mellitus without complication ([place])    Acute respiratory failure with hypoxia ([place]) 12/15/2019   Type 2 diabetes mellitus with hyperglycemia ([place]) 10/26/2018   Morbid obesity ([place]) 10/26/2018   Depression 10/26/2018   Asthma exacerbation 10/26/2018   Elevated BP without diagnosis of hypertension 10/26/2018   Posterior subcapsular polar cataract, 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       Home Medications    Prior to Admission medications   Medication Sig Start Date End Date Taking? Authorizing Provider  montelukast (SINGULAIR) 10 MG tablet Take 10 mg by mouth at bedtime.   Yes [provider]  albuterol (VENTOLIN HFA) 108 (90 Base) MCG/ACT inhaler Inhale 2 puffs into the lungs every 4 (four) hours as needed for shortness of breath or wheezing. 11/29/20   [provider]  budesonide-formoterol (SYMBICORT) 160-4.5 MCG/ACT inhaler INHALE 2 PUFFS INTO THE LUNGS EVERY MORNING AND AT BEDTIME 05/15/20 05/15/21  Lavonia Drafts, MD  EPINEPHrine 0.3 mg/0.3 mL IJ SOAJ injection Inject 0.3 mg into the muscle as needed for anaphylaxis.    [provider]  famotidine (PEPCID) 20 MG tablet Take 1 tablet (20 mg total) by mouth daily for 14 days. 12/06/20 12/20/20  Nolberto Hanlon, MD  halobetasol (ULTRAVATE) 0.05 % cream Apply 1 application topically 2 (two) times daily. Apply topically twice daily after showers    [provider]  hydrochlorothiazide (HYDRODIURIL) 25 MG tablet Take 1 tablet (25 mg total) by mouth daily. Patient not taking: Reported on 12/05/2020 11/15/19   Lavina Hamman, MD    Family History Family History  Problem Relation Age of Onset   Eczema Mother    Diabetes Father    Psoriasis Father    Eczema Father    Sleep apnea Father    Asthma Sister    Eczema Sister  Eczema Sister     Social History Social History   Tobacco Use   Smoking status: Former    Packs/day: 0.50    Types: Cigarettes    Quit date: 04/16/2019    Years since quitting: 1.7   Smokeless tobacco: Never  Vaping Use   Vaping Use: Never used  Substance Use Topics   Alcohol use: Yes    Alcohol/week: 4.0 standard drinks    Types: 4 Shots of liquor per week   Drug use: Yes    Types: Marijuana     Allergies   Bee venom, Peanut oil, and Shellfish allergy   Review of Systems Review of Systems  As stated above in HPI Physical Exam Triage Vital  Signs ED Triage Vitals  Enc Vitals Group     BP 01/06/21 1554 (!) 140/96     Pulse Rate 01/06/21 1554 84     Resp 01/06/21 1554 18     Temp 01/06/21 1554 98.9 F (37.2 C)     Temp Source 01/06/21 1554 Oral     SpO2 01/06/21 1554 94 %     Weight --      Height --      Head Circumference --      Peak Flow --      Pain Score 01/06/21 1552 0     Pain Loc --      Pain Edu? --      Excl. in GC? --    No data found.  Updated Vital Signs BP (!) 140/96 (BP Location: Left Arm)    Pulse 84    Temp 98.9 F (37.2 C) (Oral)    Resp 18    SpO2 94%   Physical Exam Vitals and nursing note reviewed.  Constitutional:      General: She is not in acute distress.    Appearance: Normal appearance. She is not ill-appearing, toxic-appearing or diaphoretic.  HENT:     Head: Normocephalic and atraumatic.     Right Ear: Tympanic membrane normal.     Left Ear: Tympanic membrane normal.     Nose: Nose normal.     Mouth/Throat:     Mouth: Mucous membranes are moist.     Pharynx: Oropharynx is clear. No oropharyngeal exudate or posterior oropharyngeal erythema.  Eyes:     Extraocular Movements: Extraocular movements intact.     Conjunctiva/sclera: Conjunctivae normal.     Pupils: Pupils are equal, round, and reactive to light.  Cardiovascular:     Rate and Rhythm: Normal rate and regular rhythm.     Heart sounds: Normal heart sounds.  Pulmonary:     Effort: Pulmonary effort is normal.     Breath sounds: Wheezing (scattered throughout) present.  Musculoskeletal:     Cervical back: Normal range of motion and neck supple.  Lymphadenopathy:     Cervical: No cervical adenopathy.  Skin:    General: Skin is warm.     Findings: Rash present.  Neurological:     Mental Status: She is alert and oriented to person, place, and time.     UC Treatments / Results  Labs (all labs ordered are listed, but only abnormal results are displayed) Labs Reviewed - No data to display  EKG   Radiology No  results found.  Procedures Procedures (including critical care time)  Medications Ordered in UC Medications - No data to display  Initial Impression / Assessment and Plan / UC Course  I have reviewed the triage vital signs and  the nursing notes.  Pertinent labs & imaging results that were available during my care of the patient were reviewed by me and considered in my medical decision making (see chart for details).     New. Reviewed labs and imaging from recent ER visit. I understand her concerns. For now we will refill her albuterol and give her a low dose prednisone taper to use if needed. She will restart her symbicort in January and will follow up with PCP, pulmonology or our office as needed. Discussed red flag signs and symptoms.  Final Clinical Impressions(s) / UC Diagnoses   Final diagnoses:  SOB (shortness of breath)  Moderate persistent asthma with acute exacerbation   Discharge Instructions   None    ED Prescriptions   None    PDMP not reviewed this encounter.   Hughie Closs, [place] 01/06/21 1614

## 2021-01-07 ENCOUNTER — Telehealth: Payer: Self-pay | Admitting: Family Medicine

## 2021-01-07 MED ORDER — ALBUTEROL SULFATE (2.5 MG/3ML) 0.083% IN NEBU: 2.5000 mg | INHALATION_SOLUTION | Freq: Four times a day (QID) | RESPIRATORY_TRACT | 12 refills | Status: DC | PRN

## 2021-01-07 NOTE — Telephone Encounter (Signed)
Patient seen yesterday for medication refill of albuterol.  Albuterol for nebulizer treatments should be prescribed at a dosage of 0.083 however the wrong dosage was sent to the pharmacy.  I am sending the correct dosage over to the pharmacy for patient to pick up.

## 2021-02-03 ENCOUNTER — Ambulatory Visit
Admission: RE | Admit: 2021-02-03 | Discharge: 2021-02-03 | Disposition: A | Discharge status: Home / Self Care | Payer: Medicare Other | Source: Ambulatory Visit | Attending: Medical Oncology | Admitting: Medical Oncology

## 2021-02-03 ENCOUNTER — Emergency Department: Payer: BLUE CROSS/BLUE SHIELD

## 2021-02-03 ENCOUNTER — Encounter: Payer: Self-pay | Admitting: Emergency Medicine

## 2021-02-03 ENCOUNTER — Inpatient Hospital Stay
Admission: EM | Admit: 2021-02-03 | Discharge: 2021-02-06 | DRG: 202 | Disposition: A | Discharge status: Home / Self Care | Payer: BLUE CROSS/BLUE SHIELD | Attending: Internal Medicine | Admitting: Internal Medicine

## 2021-02-03 ENCOUNTER — Other Ambulatory Visit: Payer: Self-pay

## 2021-02-03 VITALS — BP 144/98 | HR 96 | Temp 98.6°F | Resp 20

## 2021-02-03 DIAGNOSIS — E1165 Type 2 diabetes mellitus with hyperglycemia: Secondary | ICD-10-CM | POA: Diagnosis present

## 2021-02-03 DIAGNOSIS — J45901 Unspecified asthma with (acute) exacerbation: Secondary | ICD-10-CM | POA: Diagnosis not present

## 2021-02-03 DIAGNOSIS — Z20822 Contact with and (suspected) exposure to covid-19: Secondary | ICD-10-CM | POA: Diagnosis present

## 2021-02-03 DIAGNOSIS — Z91010 Allergy to peanuts: Secondary | ICD-10-CM

## 2021-02-03 DIAGNOSIS — K219 Gastro-esophageal reflux disease without esophagitis: Secondary | ICD-10-CM | POA: Diagnosis not present

## 2021-02-03 DIAGNOSIS — E662 Morbid (severe) obesity with alveolar hypoventilation: Secondary | ICD-10-CM | POA: Diagnosis present

## 2021-02-03 DIAGNOSIS — T380X5A Adverse effect of glucocorticoids and synthetic analogues, initial encounter: Secondary | ICD-10-CM | POA: Diagnosis not present

## 2021-02-03 DIAGNOSIS — I10 Essential (primary) hypertension: Secondary | ICD-10-CM | POA: Diagnosis present

## 2021-02-03 DIAGNOSIS — L309 Dermatitis, unspecified: Secondary | ICD-10-CM | POA: Diagnosis present

## 2021-02-03 DIAGNOSIS — J9601 Acute respiratory failure with hypoxia: Secondary | ICD-10-CM | POA: Diagnosis present

## 2021-02-03 DIAGNOSIS — J4541 Moderate persistent asthma with (acute) exacerbation: Secondary | ICD-10-CM | POA: Diagnosis not present

## 2021-02-03 DIAGNOSIS — Z91013 Allergy to seafood: Secondary | ICD-10-CM

## 2021-02-03 DIAGNOSIS — Z87891 Personal history of nicotine dependence: Secondary | ICD-10-CM

## 2021-02-03 DIAGNOSIS — I5031 Acute diastolic (congestive) heart failure: Secondary | ICD-10-CM

## 2021-02-03 DIAGNOSIS — R0989 Other specified symptoms and signs involving the circulatory and respiratory systems: Secondary | ICD-10-CM | POA: Diagnosis present

## 2021-02-03 DIAGNOSIS — J4531 Mild persistent asthma with (acute) exacerbation: Secondary | ICD-10-CM | POA: Diagnosis not present

## 2021-02-03 DIAGNOSIS — Z79899 Other long term (current) drug therapy: Secondary | ICD-10-CM

## 2021-02-03 DIAGNOSIS — Z6841 Body Mass Index (BMI) 40.0 and over, adult: Secondary | ICD-10-CM

## 2021-02-03 DIAGNOSIS — J45909 Unspecified asthma, uncomplicated: Secondary | ICD-10-CM

## 2021-02-03 DIAGNOSIS — R0602 Shortness of breath: Secondary | ICD-10-CM | POA: Diagnosis not present

## 2021-02-03 DIAGNOSIS — Z91030 Bee allergy status: Secondary | ICD-10-CM

## 2021-02-03 DIAGNOSIS — Z825 Family history of asthma and other chronic lower respiratory diseases: Secondary | ICD-10-CM

## 2021-02-03 DIAGNOSIS — Z833 Family history of diabetes mellitus: Secondary | ICD-10-CM

## 2021-02-03 LAB — RESP PANEL BY RT-PCR (FLU A&B, COVID) ARPGX2
Influenza A by PCR: NEGATIVE
Influenza B by PCR: NEGATIVE
SARS Coronavirus 2 by RT PCR: NEGATIVE

## 2021-02-03 LAB — CBC
HCT: 40 % (ref 36.0–46.0)
Hemoglobin: 12.1 g/dL (ref 12.0–15.0)
MCH: 25.3 pg — ABNORMAL LOW (ref 26.0–34.0)
MCHC: 30.3 g/dL (ref 30.0–36.0)
MCV: 83.7 fL (ref 80.0–100.0)
Platelets: 321 10*3/uL (ref 150–400)
RBC: 4.78 MIL/uL (ref 3.87–5.11)
RDW: 14.7 % (ref 11.5–15.5)
WBC: 9.3 10*3/uL (ref 4.0–10.5)
nRBC: 0 % (ref 0.0–0.2)

## 2021-02-03 LAB — BASIC METABOLIC PANEL
Anion gap: 5 (ref 5–15)
BUN: 10 mg/dL (ref 6–20)
CO2: 33 mmol/L — ABNORMAL HIGH (ref 22–32)
Calcium: 8.7 mg/dL — ABNORMAL LOW (ref 8.9–10.3)
Chloride: 100 mmol/L (ref 98–111)
Creatinine, Ser: 0.7 mg/dL (ref 0.44–1.00)
GFR, Estimated: >60 mL/min (ref >60)
Glucose, Bld: 144 mg/dL — ABNORMAL HIGH (ref 70–99)
Potassium: 3.9 mmol/L (ref 3.5–5.1)
Sodium: 138 mmol/L (ref 135–145)

## 2021-02-03 LAB — TROPONIN I (HIGH SENSITIVITY): Troponin I (High Sensitivity): <2 ng/L (ref <18)

## 2021-02-03 LAB — POCT FASTING CBG KUC MANUAL ENTRY: POCT Glucose (KUC): 140 mg/dL — AB (ref 70–99)

## 2021-02-03 MED ORDER — PREDNISONE 20 MG PO TABS
40.0000 mg | ORAL_TABLET | Freq: Every day | ORAL | Status: DC
  Administered 2021-02-05: 40 mg via ORAL
  Filled 2021-02-03: qty 2

## 2021-02-03 MED ORDER — IPRATROPIUM-ALBUTEROL 0.5-2.5 (3) MG/3ML IN SOLN
RESPIRATORY_TRACT | Status: AC
  Administered 2021-02-03: 3 mL via RESPIRATORY_TRACT
  Filled 2021-02-03: qty 9

## 2021-02-03 MED ORDER — FAMOTIDINE 20 MG PO TABS
20.0000 mg | ORAL_TABLET | Freq: Every day | ORAL | Status: DC
  Administered 2021-02-04 – 2021-02-06 (×3): 20 mg via ORAL
  Filled 2021-02-03 (×3): qty 1

## 2021-02-03 MED ORDER — METHYLPREDNISOLONE SODIUM SUCC 125 MG IJ SOLR
125.0000 mg | Freq: Once | INTRAMUSCULAR | Status: AC
  Administered 2021-02-03: 125 mg via INTRAVENOUS
  Filled 2021-02-03: qty 2

## 2021-02-03 MED ORDER — PREDNISONE 10 MG PO TABS: ORAL_TABLET | ORAL | 0 refills | Status: DC

## 2021-02-03 MED ORDER — ALBUTEROL SULFATE HFA 108 (90 BASE) MCG/ACT IN AERS: 2.0000 | INHALATION_SPRAY | RESPIRATORY_TRACT | 0 refills | Status: DC | PRN

## 2021-02-03 MED ORDER — IPRATROPIUM-ALBUTEROL 0.5-2.5 (3) MG/3ML IN SOLN
3.0000 mL | Freq: Four times a day (QID) | RESPIRATORY_TRACT | Status: DC
  Administered 2021-02-03 – 2021-02-06 (×10): 3 mL via RESPIRATORY_TRACT
  Filled 2021-02-03 (×11): qty 3

## 2021-02-03 MED ORDER — TRAZODONE HCL 50 MG PO TABS: 25.0000 mg | ORAL_TABLET | Freq: Every evening | ORAL | Status: DC | PRN

## 2021-02-03 MED ORDER — SODIUM CHLORIDE 0.9 % IV SOLN: INTRAVENOUS | Status: DC

## 2021-02-03 MED ORDER — BLOOD GLUCOSE MONITOR KIT
PACK | 0 refills | Status: AC
Start: 2021-02-03 — End: ?

## 2021-02-03 MED ORDER — METHYLPREDNISOLONE SODIUM SUCC 40 MG IJ SOLR
40.0000 mg | Freq: Two times a day (BID) | INTRAMUSCULAR | Status: AC
  Administered 2021-02-04 (×2): 40 mg via INTRAVENOUS
  Filled 2021-02-03 (×2): qty 1

## 2021-02-03 MED ORDER — ALBUTEROL SULFATE (2.5 MG/3ML) 0.083% IN NEBU: 2.5000 mg | INHALATION_SOLUTION | Freq: Four times a day (QID) | RESPIRATORY_TRACT | 0 refills | Status: DC | PRN

## 2021-02-03 MED ORDER — ENOXAPARIN SODIUM 80 MG/0.8ML IJ SOSY
0.5000 mg/kg | PREFILLED_SYRINGE | INTRAMUSCULAR | Status: DC
  Administered 2021-02-04 – 2021-02-06 (×3): 80 mg via SUBCUTANEOUS
  Filled 2021-02-03 (×3): qty 0.8

## 2021-02-03 MED ORDER — MAGNESIUM HYDROXIDE 400 MG/5ML PO SUSP
30.0000 mL | Freq: Every day | ORAL | Status: DC | PRN
  Administered 2021-02-05: 30 mL via ORAL
  Filled 2021-02-03: qty 30

## 2021-02-03 MED ORDER — EPINEPHRINE 0.3 MG/0.3ML IJ SOAJ
0.3000 mg | INTRAMUSCULAR | Status: DC | PRN
  Filled 2021-02-03: qty 0.3

## 2021-02-03 MED ORDER — IPRATROPIUM-ALBUTEROL 0.5-2.5 (3) MG/3ML IN SOLN
3.0000 mL | Freq: Once | RESPIRATORY_TRACT | Status: AC
  Administered 2021-02-03: 3 mL via RESPIRATORY_TRACT
  Filled 2021-02-03: qty 3

## 2021-02-03 MED ORDER — ONDANSETRON HCL 4 MG PO TABS: 4.0000 mg | ORAL_TABLET | Freq: Four times a day (QID) | ORAL | Status: DC | PRN

## 2021-02-03 MED ORDER — BUDESONIDE-FORMOTEROL FUMARATE 160-4.5 MCG/ACT IN AERO: 2.0000 | INHALATION_SPRAY | Freq: Two times a day (BID) | RESPIRATORY_TRACT | 0 refills | Status: DC

## 2021-02-03 MED ORDER — HALOBETASOL PROPIONATE 0.05 % EX CREA: 1.0000 "application " | TOPICAL_CREAM | Freq: Two times a day (BID) | CUTANEOUS | Status: DC

## 2021-02-03 MED ORDER — MONTELUKAST SODIUM 10 MG PO TABS
10.0000 mg | ORAL_TABLET | Freq: Every day | ORAL | Status: DC
  Administered 2021-02-04 – 2021-02-05 (×2): 10 mg via ORAL
  Filled 2021-02-03 (×2): qty 1

## 2021-02-03 MED ORDER — IPRATROPIUM-ALBUTEROL 0.5-2.5 (3) MG/3ML IN SOLN
3.0000 mL | Freq: Once | RESPIRATORY_TRACT | Status: AC
  Filled 2021-02-03: qty 3

## 2021-02-03 MED ORDER — IPRATROPIUM-ALBUTEROL 0.5-2.5 (3) MG/3ML IN SOLN
3.0000 mL | Freq: Once | RESPIRATORY_TRACT | Status: AC
Start: 2021-02-03 — End: 2021-02-03
  Administered 2021-02-03: 3 mL via RESPIRATORY_TRACT
  Filled 2021-02-03: qty 3

## 2021-02-03 MED ORDER — ONDANSETRON HCL 4 MG/2ML IJ SOLN: 4.0000 mg | Freq: Four times a day (QID) | INTRAMUSCULAR | Status: DC | PRN

## 2021-02-03 MED ORDER — ACETAMINOPHEN 325 MG PO TABS: 650.0000 mg | ORAL_TABLET | Freq: Four times a day (QID) | ORAL | Status: DC | PRN

## 2021-02-03 MED ORDER — ACETAMINOPHEN 650 MG RE SUPP: 650.0000 mg | Freq: Four times a day (QID) | RECTAL | Status: DC | PRN

## 2021-02-03 NOTE — H&P (Signed)
Kelsey Mcclure   PATIENT NAME: Kelsey Mcclure    MR#:  [id number]  DATE OF BIRTH:  [birth year]  DATE OF ADMISSION:  02/03/2021  PRIMARY CARE PHYSICIAN: System, Provider Not In   Patient is coming from: Home  REQUESTING/REFERRING PHYSICIAN: Nance Pear, MD  CHIEF COMPLAINT:   Chief Complaint  Patient presents with   Shortness of Breath    HISTORY OF PRESENT ILLNESS:  Kelsey Mcclure is a 31 y.o. female with medical history significant for asthma, type 2 diabetes mellitus and eczema, who presented to the ER with acute onset of nasal congestion and cough productive of yellow sputum over the last week.  She just had a refill yesterday for her asthma medications. She has been having dyspnea and wheezing.  No fever or chills.  No nausea or vomiting or abdominal pain.  No dysuria, oliguria, urinary frequency or urgency or flank pain.  ED Course: Upon presentation to the emergency room, blood pressure was 157/113 and later 144/98 with pulse oximetry of 87% on room air and 97% on 2 L of O2 by nasal cannula.  Labs revealed a CO2 of 33 and high-sensitivity troponin I was less than 2.  CBC-was unremarkable.  Influenza antigens came back negative as well as COVID-19 PCR.  EKG as reviewed by me : EKG showed normal sinus rhythm with a rate of 85 Imaging: Chest x-ray showed mild cardiomegaly with mild central vascular congestion.  The patient was given DuoNeb's x3 and 125 mg of IV Solu-Medrol.  She will be admitted to a medical telemetry observation bed for further evaluation and management. PAST MEDICAL HISTORY:   Past Medical History:  Diagnosis Date   Asthma    Diabetes mellitus without complication ([place])    Eczema     PAST SURGICAL HISTORY:   Past Surgical History:  Procedure Laterality Date   CATARACT EXTRACTION, BILATERAL      SOCIAL HISTORY:   Social History   Tobacco Use   Smoking status: Former    Packs/day: 0.50    Types: Cigarettes    Quit date:  04/16/2019    Years since quitting: 1.8   Smokeless tobacco: Never  Substance Use Topics   Alcohol use: Yes    Alcohol/week: 4.0 standard drinks    Types: 4 Shots of liquor per week    FAMILY HISTORY:   Family History  Problem Relation Age of Onset   Eczema Mother    Diabetes Father    Psoriasis Father    Eczema Father    Sleep apnea Father    Asthma Sister    Eczema Sister    Eczema Sister     DRUG ALLERGIES:   Allergies  Allergen Reactions   Bee Venom Anaphylaxis   Peanut Oil Anaphylaxis   Shellfish Allergy Hives    REVIEW OF SYSTEMS:   ROS As per history of present illness. All pertinent systems were reviewed above. Constitutional, HEENT, cardiovascular, respiratory, GI, GU, musculoskeletal, neuro, psychiatric, endocrine, integumentary and hematologic systems were reviewed and are otherwise negative/unremarkable except for positive findings mentioned above in the HPI.   MEDICATIONS AT HOME:   Prior to Admission medications   Medication Sig Start Date End Date Taking? Authorizing Provider  albuterol (PROVENTIL) (2.5 MG/3ML) 0.083% nebulizer solution Take 3 mLs (2.5 mg total) by nebulization every 6 (six) hours as needed for wheezing or shortness of breath. 02/03/21   Hughie Closs, PA-C  albuterol (VENTOLIN HFA) 108 (90 Base) MCG/ACT inhaler Inhale 2 puffs  into the lungs every 4 (four) hours as needed for shortness of breath or wheezing. 02/03/21   Hughie Closs, PA-C  blood glucose meter kit and supplies KIT Dispense based on patient and insurance preference. Use up to four times daily as directed. 02/03/21   Hughie Closs, PA-C  budesonide-formoterol (SYMBICORT) 160-4.5 MCG/ACT inhaler Inhale 2 puffs into the lungs 2 (two) times daily. 02/03/21   Hughie Closs, PA-C  EPINEPHrine 0.3 mg/0.3 mL IJ SOAJ injection Inject 0.3 mg into the muscle as needed for anaphylaxis.    [provider]  famotidine (PEPCID) 20 MG tablet Take 1 tablet (20 mg  total) by mouth daily for 14 days. 12/06/20 12/20/20  Nolberto Hanlon, MD  halobetasol (ULTRAVATE) 0.05 % cream Apply 1 application topically 2 (two) times daily. Apply topically twice daily after showers    [provider]  hydrochlorothiazide (HYDRODIURIL) 25 MG tablet Take 1 tablet (25 mg total) by mouth daily. Patient not taking: Reported on 12/05/2020 11/15/19   Lavina Hamman, MD  montelukast (SINGULAIR) 10 MG tablet Take 10 mg by mouth at bedtime.    [provider]  predniSONE (DELTASONE) 10 MG tablet Take 4 tablets by mouth with breakfast for 2 days, 2 tablets by mouth for 2 days and 1 tablet by mouth for 2 days. 01/06/21   Hughie Closs, PA-C  predniSONE (DELTASONE) 10 MG tablet Take 4 tablets by mouth with breakfast for 2 days, 2 tablets by mouth for 2 days and 1 tablet by mouth for 2 days. 02/03/21   Hughie Closs, PA-C      VITAL SIGNS:  Blood pressure (!) 149/99, pulse 89, temperature 98 F (36.7 C), temperature source Oral, resp. rate (!) 26, SpO2 (!) 89 %.  PHYSICAL EXAMINATION:  Physical Exam  GENERAL:  31 y.o.-year-old Caucasian female patient lying in the bed with mild respiratory distress with conversational dyspnea.Marland Kitchen  EYES: Pupils equal, round, reactive to light and accommodation. No scleral icterus. Extraocular muscles intact.  HEENT: Head atraumatic, normocephalic. Oropharynx and nasopharynx clear.  NECK:  Supple, no jugular venous distention. No thyroid enlargement, no tenderness.  LUNGS: Diminished bibasilar breath sounds with diminished expiratory airflow and expiratory wheezes and harsh vesicular breathing.. No use of accessory muscles of respiration.  CARDIOVASCULAR: Regular rate and rhythm, S1, S2 normal. No murmurs, rubs, or gallops.  ABDOMEN: Soft, nondistended, nontender. Bowel sounds present. No organomegaly or mass.  EXTREMITIES: No pedal edema, cyanosis, or clubbing.  NEUROLOGIC: Cranial nerves II through XII are intact. Muscle  strength 5/5 in all extremities. Sensation intact. Gait not checked.  PSYCHIATRIC: The patient is alert and oriented x 3.  Normal affect and good eye contact. SKIN: No obvious rash, lesion, or ulcer.   LABORATORY PANEL:   CBC Recent Labs  Lab 02/03/21 2121  WBC 9.3  HGB 12.1  HCT 40.0  PLT 321   ------------------------------------------------------------------------------------------------------------------  Chemistries  Recent Labs  Lab 02/03/21 2121  NA 138  K 3.9  CL 100  CO2 33*  GLUCOSE 144*  BUN 10  CREATININE 0.70  CALCIUM 8.7*   ------------------------------------------------------------------------------------------------------------------  Cardiac Enzymes No results for input(s): TROPONINI in the last 168 hours. ------------------------------------------------------------------------------------------------------------------  RADIOLOGY:  DG Chest 2 View  Result Date: 02/03/2021 CLINICAL DATA:  Shortness of breath. EXAM: CHEST - 2 VIEW COMPARISON:  Chest radiograph dated 12/05/2020. FINDINGS: Mild cardiomegaly with mild central vascular congestion. No focal consolidation, pleural effusion, pneumothorax. No acute osseous pathology. IMPRESSION: Mild cardiomegaly with mild central vascular  congestion. Electronically Signed   By: Anner Crete M.D.   On: 02/03/2021 21:55      IMPRESSION AND PLAN:  Principal Problem:   Acute asthma exacerbation  1.  Acute asthma exacerbation. - The patient will be admitted to a medical telemetry bed. - We will continue steroid therapy with IV saline withdrawal. - We will continue bronchodilator therapy with DuoNebs. - Mucolytic therapy will be provided. - We will hold off Symbicort. - We will continue Singulair.  2.  Mild acute CHF possibly core pump now due to #1. - She will be diuresed with IV Lasix. - 2D echo will be obtained.  3.  GERD. - We will continue H2 blocker therapy.  4.  Essential hypertension. - We  will continue HCTZ.  DVT prophylaxis: Lovenox. Code Status: full code. Family Communication:  The plan of care was discussed in details with the patient (and family). I answered all questions. The patient agreed to proceed with the above mentioned plan. Further management will depend upon hospital course. Disposition Plan: Back to previous home environment Consults called: none. All the records are reviewed and case discussed with ED provider.  Status is: Observation   I certify that at the time of admission, it is my clinical judgment that the patient will require inpatient hospital care extending less than 2 midnights.                            Dispo: The patient is from: Home              Anticipated d/c is to: Home              Patient currently is not medically stable to d/c.              Difficult to place patient: No  Christel Mormon M.D on 02/03/2021 at 11:10 PM  [facility]   From 7 PM-7 AM, contact night-coverage [email or web address]  CC: Primary care physician; System, Provider Not In

## 2021-02-03 NOTE — ED Triage Notes (Signed)
Pt here with asthma exacerbation, nasal congestion x 1 week. Pt is out of her asthma meds. Has PCP appt in February.

## 2021-02-03 NOTE — ED Provider Notes (Signed)
[facility] Provider Note    Event Date/Time   First MD Initiated Contact with Patient 02/03/21 2110     (approximate)   History   Shortness of Breath   HPI  Kelsey Mcclure is a 31 y.o. female  who, per discharge summary dated 11.13.22 has history of asthma, eczema, DM and admission for asthma exacerbation, presents to the emergency department today because of concern for shortness of breath. The patient states that it started roughly 10 days ago. Has been constant and slightly worsening since then. The patient had run out of her albuterol. Went to urgent care earlier today and was prescribed steroids and albuterol although has not been able to pick them up. The patient denies any fevers. Does have some associated chest tightness.       Physical Exam   Triage Vital Signs: ED Triage Vitals [02/03/21 2114]  Enc Vitals Group     BP (!) 149/99     Pulse Rate 89     Resp (!) 26     Temp 98 F (36.7 C)     Temp Source Oral     SpO2 (!) 89 %   Most recent vital signs: Vitals:   02/03/21 2114  BP: (!) 149/99  Pulse: 89  Resp: (!) 26  Temp: 98 F (36.7 C)  SpO2: (!) 89%    General: Awake CV:  Good peripheral perfusion.  Resp:  Tachypnea. Diffuse expiratory wheezing. Increased work of breathing. Abd:  No distention.   ED Results / Procedures / Treatments   Labs (all labs ordered are listed, but only abnormal results are displayed) Labs Reviewed  BASIC METABOLIC PANEL - Abnormal; Notable for the following components:      Result Value   CO2 33 (*)    Glucose, Bld 144 (*)    Calcium 8.7 (*)    All other components within normal limits  CBC - Abnormal; Notable for the following components:   MCH 25.3 (*)    All other components within normal limits  RESP PANEL BY RT-PCR (FLU A&B, COVID) ARPGX2  POC URINE PREG, ED  TROPONIN I (HIGH SENSITIVITY)  TROPONIN I (HIGH SENSITIVITY)     EKG  I, Nance Pear, attending physician,  personally viewed and interpreted this EKG  EKG Time: 2114 Rate: 85 Rhythm: normal sinus rhythm Axis: normal Intervals: qtc 416 QRS: narrow ST changes: no st elevation Impression: normal ekg    RADIOLOGY CXR My interpretation: No pneumonia, no pneumothorax.  Radiology interpretation:  IMPRESSION:  Mild cardiomegaly with mild central vascular congestion.        PROCEDURES:  Critical Care performed: No  Procedures   MEDICATIONS ORDERED IN ED: Medications  methylPREDNISolone sodium succinate (SOLU-MEDROL) 125 mg/2 mL injection 125 mg (has no administration in time range)  ipratropium-albuterol (DUONEB) 0.5-2.5 (3) MG/3ML nebulizer solution 3 mL (3 mLs Nebulization Given 02/03/21 2120)  ipratropium-albuterol (DUONEB) 0.5-2.5 (3) MG/3ML nebulizer solution 3 mL (3 mLs Nebulization Given 02/03/21 2120)  ipratropium-albuterol (DUONEB) 0.5-2.5 (3) MG/3ML nebulizer solution 3 mL (3 mLs Nebulization Given 02/03/21 2120)     IMPRESSION / MDM / ASSESSMENT AND PLAN / ED COURSE  I reviewed the triage vital signs and the nursing notes.                              Differential diagnosis includes, but is not limited to, asthma exacerbation, covid, influenza, pneumonia.   Patient presented  to the emergency department today because of concern for shortness of breath for roughly 10 days. Has history of asthma. Found to be hypoxic on room air. Placed on 2L O2 [place]. Exam is notable for increased work of breathing and diffuse expiratory wheezing. CXR was obtained which did not show any pneumonia. Blood work without any anemia. No concerning electrolyte abnormality, slightly increased co2. Patient given steroids and duoneb treatments here. Given continued increased work of breathing do think patient would benefit from admission. Discussed findings and plan with patient. Discussed case with hospitalist.    FINAL CLINICAL IMPRESSION(S) / ED DIAGNOSES   Final diagnoses:  Exacerbation of asthma,  unspecified asthma severity, unspecified whether persistent     Note:  This document was prepared using Dragon voice recognition software and may include unintentional dictation errors.    Nance Pear, MD 02/03/21 2257

## 2021-02-03 NOTE — ED Provider Notes (Signed)
[name]    CSN: [id number] Arrival date & time: 02/03/21  1049      History   Chief Complaint Chief Complaint  Patient presents with   Nasal Congestion   Asthma    HPI Kelsey Mcclure is a 31 y.o. female.   HPI  Cough: Patient reports that she has had nasal congestion and a dry cough that feels like an asthma exacerbation for the past 1 week. She reports that she is currently out of her asthma medication which is what brought her in today. She denies fevers, significant SOB, vomiting. She has tried nothing for symptoms. Of note she has a PCP appointment in Feb.   Past Medical History:  Diagnosis Date   Asthma    Diabetes mellitus without complication ([place])    Eczema     Patient Active Problem List   Diagnosis Date Noted   Diabetes mellitus without complication ([place])    Acute respiratory failure with hypoxia ([place]) 12/15/2019   Type 2 diabetes mellitus with hyperglycemia ([place]) 10/26/2018   Morbid obesity ([place]) 10/26/2018   Depression 10/26/2018   Asthma exacerbation 10/26/2018   Elevated BP without diagnosis of hypertension 10/26/2018   Posterior subcapsular polar cataract, 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      Home Medications    Prior to Admission medications   Medication Sig Start Date End Date Taking? Authorizing Provider  albuterol (PROVENTIL) (2.5 MG/3ML) 0.083% nebulizer solution Take 3 mLs (2.5 mg total) by nebulization every 6 (six) hours as needed for wheezing or shortness of breath. 01/07/21   Scot Jun, FNP  albuterol (VENTOLIN HFA) 108 (90 Base) MCG/ACT inhaler Inhale 2 puffs into the lungs every 4 (four) hours as needed for shortness of breath or wheezing. 01/06/21   Hughie Closs, PA-C  budesonide-formoterol (SYMBICORT) 160-4.5 MCG/ACT inhaler INHALE 2 PUFFS INTO THE LUNGS EVERY MORNING AND AT  BEDTIME 05/15/20 05/15/21  Lavonia Drafts, MD  EPINEPHrine 0.3 mg/0.3 mL IJ SOAJ injection Inject 0.3 mg into the muscle as needed for anaphylaxis.    [provider]  famotidine (PEPCID) 20 MG tablet Take 1 tablet (20 mg total) by mouth daily for 14 days. 12/06/20 12/20/20  Nolberto Hanlon, MD  halobetasol (ULTRAVATE) 0.05 % cream Apply 1 application topically 2 (two) times daily. Apply topically twice daily after showers    [provider]  hydrochlorothiazide (HYDRODIURIL) 25 MG tablet Take 1 tablet (25 mg total) by mouth daily. Patient not taking: Reported on 12/05/2020 11/15/19   Lavina Hamman, MD  montelukast (SINGULAIR) 10 MG tablet Take 10 mg by mouth at bedtime.    [provider]  predniSONE (DELTASONE) 10 MG tablet Take 4 tablets by mouth with breakfast for 2 days, 2 tablets by mouth for 2 days and 1 tablet by mouth for 2 days. 01/06/21   Hughie Closs, PA-C    Family History Family History  Problem Relation Age of Onset   Eczema Mother    Diabetes Father    Psoriasis Father    Eczema Father    Sleep apnea Father    Asthma Sister    Eczema Sister    Eczema Sister     Social History Social History   Tobacco Use   Smoking status: Former    Packs/day: 0.50    Types: Cigarettes  Quit date: 04/16/2019    Years since quitting: 1.8   Smokeless tobacco: Never  Vaping Use   Vaping Use: Never used  Substance Use Topics   Alcohol use: Yes    Alcohol/week: 4.0 standard drinks    Types: 4 Shots of liquor per week   Drug use: Yes    Types: Marijuana     Allergies   Bee venom, Peanut oil, and Shellfish allergy   Review of Systems Review of Systems  As stated above in HPI Physical Exam Triage Vital Signs ED Triage Vitals  Enc Vitals Group     BP 02/03/21 1123 (!) 157/113     Pulse Rate 02/03/21 1123 96     Resp 02/03/21 1123 20     Temp 02/03/21 1123 98.6 F (37 C)     Temp Source 02/03/21 1123 Oral     SpO2 02/03/21 1123 (!) 87 %      Weight --      Height --      Head Circumference --      Peak Flow --      Pain Score 02/03/21 1128 0     Pain Loc --      Pain Edu? --      Excl. in [place]? --    No data found.  Updated Vital Signs BP (!) 144/98 (BP Location: Left Wrist)    Pulse 96    Temp 98.6 F (37 C) (Oral)    Resp 20    LMP  (LMP Unknown)    SpO2 97%   Physical Exam Vitals and nursing note reviewed.  Constitutional:      General: She is not in acute distress.    Appearance: Normal appearance. She is obese. She is not ill-appearing, toxic-appearing or diaphoretic.  HENT:     Head: Normocephalic and atraumatic.     Right Ear: Tympanic membrane normal.     Left Ear: Tympanic membrane normal.     Nose: Congestion and rhinorrhea present.     Mouth/Throat:     Mouth: Mucous membranes are moist.     Pharynx: Oropharynx is clear. No oropharyngeal exudate or posterior oropharyngeal erythema.  Eyes:     General:        Right eye: No discharge.        Left eye: No discharge.     Extraocular Movements: Extraocular movements intact.     Conjunctiva/sclera: Conjunctivae normal.     Pupils: Pupils are equal, round, and reactive to light.  Cardiovascular:     Rate and Rhythm: Normal rate and regular rhythm.     Heart sounds: Normal heart sounds.  Pulmonary:     Effort: Pulmonary effort is normal. No respiratory distress.     Breath sounds: No stridor. Wheezing (Moderate throughout) present. No rhonchi.  Abdominal:     Palpations: Abdomen is soft.  Musculoskeletal:     Cervical back: Normal range of motion and neck supple.  Lymphadenopathy:     Cervical: No cervical adenopathy.  Skin:    General: Skin is warm.     Coloration: Skin is not jaundiced.     Findings: No erythema or rash.  Neurological:     Mental Status: She is alert and oriented to person, place, and time.     UC Treatments / Results  Labs (all labs ordered are listed, but only abnormal results are displayed) Labs Reviewed - No data to  display  EKG   Radiology No results found.  Procedures Procedures (including critical care time)  Medications Ordered in UC Medications - No data to display  Initial Impression / Assessment and Plan / UC Course  I have reviewed the triage vital signs and the nursing notes.  Pertinent labs & imaging results that were available during my care of the patient were reviewed by me and considered in my medical decision making (see chart for details).     New. Refilling her asthma medications and discussed the importance of her following up with her PCP. Checking CBG to ensure that glucose values are appropriate value for steroid use as she does not recall checking glucose values within the last month. Discussed red flag signs and symptoms. Follow up PRN.  Final Clinical Impressions(s) / UC Diagnoses   Final diagnoses:  None   Discharge Instructions   None    ED Prescriptions   None    PDMP not reviewed this encounter.   Hughie Closs, [place] 02/03/21 1143

## 2021-02-03 NOTE — ED Notes (Signed)
Pt moved to cpod room 31.

## 2021-02-03 NOTE — ED Triage Notes (Signed)
Pt chronic asthmatic and reports increased SOB over last 10 days. Pt seen at [facility] and prescribed prednisone and new inhaler due to hers being low. Pt reports she has not been able to get prescriptions yet and came to ED. Pt 89% RA with inspiratory wheezing. Pt placed on 2L oxygen with increase to 97%. Pt able to speak in complete sentences without difficulty.

## 2021-02-04 ENCOUNTER — Encounter: Payer: Self-pay | Admitting: Family Medicine

## 2021-02-04 ENCOUNTER — Other Ambulatory Visit: Payer: Self-pay

## 2021-02-04 DIAGNOSIS — R0989 Other specified symptoms and signs involving the circulatory and respiratory systems: Secondary | ICD-10-CM | POA: Diagnosis present

## 2021-02-04 DIAGNOSIS — I10 Essential (primary) hypertension: Secondary | ICD-10-CM | POA: Diagnosis present

## 2021-02-04 DIAGNOSIS — K219 Gastro-esophageal reflux disease without esophagitis: Secondary | ICD-10-CM | POA: Diagnosis present

## 2021-02-04 DIAGNOSIS — J4531 Mild persistent asthma with (acute) exacerbation: Secondary | ICD-10-CM | POA: Diagnosis not present

## 2021-02-04 LAB — BASIC METABOLIC PANEL
Anion gap: 8 (ref 5–15)
BUN: 15 mg/dL (ref 6–20)
CO2: 28 mmol/L (ref 22–32)
Calcium: 9 mg/dL (ref 8.9–10.3)
Chloride: 101 mmol/L (ref 98–111)
Creatinine, Ser: 0.91 mg/dL (ref 0.44–1.00)
GFR, Estimated: >60 mL/min (ref >60)
Glucose, Bld: 305 mg/dL — ABNORMAL HIGH (ref 70–99)
Potassium: 4.1 mmol/L (ref 3.5–5.1)
Sodium: 137 mmol/L (ref 135–145)

## 2021-02-04 LAB — HEMOGLOBIN A1C
Hgb A1c MFr Bld: 7.9 % — ABNORMAL HIGH (ref 4.8–5.6)
Mean Plasma Glucose: 180.03 mg/dL

## 2021-02-04 LAB — CBG MONITORING, ED
Glucose-Capillary: 353 mg/dL — ABNORMAL HIGH (ref 70–99)
Glucose-Capillary: 286 mg/dL — ABNORMAL HIGH (ref 70–99)

## 2021-02-04 LAB — GLUCOSE, CAPILLARY
Glucose-Capillary: 271 mg/dL — ABNORMAL HIGH (ref 70–99)
Glucose-Capillary: 378 mg/dL — ABNORMAL HIGH (ref 70–99)

## 2021-02-04 LAB — TROPONIN I (HIGH SENSITIVITY): Troponin I (High Sensitivity): 5 ng/L (ref <18)

## 2021-02-04 MED ORDER — INSULIN GLARGINE-YFGN 100 UNIT/ML ~~LOC~~ SOLN
12.0000 [IU] | Freq: Every day | SUBCUTANEOUS | Status: DC
  Administered 2021-02-04 – 2021-02-06 (×3): 12 [IU] via SUBCUTANEOUS
  Filled 2021-02-04 (×4): qty 0.12

## 2021-02-04 MED ORDER — BUDESONIDE 0.25 MG/2ML IN SUSP
0.2500 mg | Freq: Two times a day (BID) | RESPIRATORY_TRACT | Status: DC
  Administered 2021-02-04 – 2021-02-06 (×5): 0.25 mg via RESPIRATORY_TRACT
  Filled 2021-02-04 (×5): qty 2

## 2021-02-04 MED ORDER — HYDROCOD POLST-CPM POLST ER 10-8 MG/5ML PO SUER
5.0000 mL | Freq: Two times a day (BID) | ORAL | Status: DC | PRN
  Administered 2021-02-04 – 2021-02-05 (×2): 5 mL via ORAL
  Filled 2021-02-04 (×2): qty 5

## 2021-02-04 MED ORDER — INSULIN ASPART 100 UNIT/ML IJ SOLN
0.0000 [IU] | Freq: Three times a day (TID) | INTRAMUSCULAR | Status: DC
  Administered 2021-02-04 (×2): 8 [IU] via SUBCUTANEOUS
  Administered 2021-02-04 – 2021-02-05 (×3): 15 [IU] via SUBCUTANEOUS
  Administered 2021-02-05: 8 [IU] via SUBCUTANEOUS
  Administered 2021-02-05: 11 [IU] via SUBCUTANEOUS
  Administered 2021-02-05 – 2021-02-06 (×2): 2 [IU] via SUBCUTANEOUS
  Administered 2021-02-06: 3 [IU] via SUBCUTANEOUS
  Filled 2021-02-04 (×10): qty 1

## 2021-02-04 MED ORDER — GUAIFENESIN ER 600 MG PO TB12
600.0000 mg | ORAL_TABLET | Freq: Two times a day (BID) | ORAL | Status: DC
  Administered 2021-02-04 – 2021-02-06 (×5): 600 mg via ORAL
  Filled 2021-02-04 (×5): qty 1

## 2021-02-04 MED ORDER — FUROSEMIDE 10 MG/ML IJ SOLN
40.0000 mg | Freq: Two times a day (BID) | INTRAMUSCULAR | Status: DC
  Administered 2021-02-04 – 2021-02-05 (×3): 40 mg via INTRAVENOUS
  Filled 2021-02-04 (×3): qty 4

## 2021-02-04 NOTE — Hospital Course (Signed)
31 y.o. female with medical history significant for asthma, type 2 diabetes mellitus and eczema, who presented to the ED on 02/03/2021 with complaints of nasal congestion and cough productive of yellow sputum over the last 10 days, worsening shortness of breath and chest tightness.  Seen at urgent care same day, prescribed prednisone. She had run out of her albuterol as well.    In the ED, patient was hypertensive, spO2 was 87% on room air, improved to 97% on 2 L/min supplemental O2.  Negative for Covid-19 and influenza A/B.  Chest x-ray showed mild cardiomegaly with mild central vascular congestion.  Treated with DuoNeb's x3 and 125 mg of IV Solu-Medrol, and admitted to [facility] service for further evaluation and management of acute asthma exacerbation.

## 2021-02-04 NOTE — Assessment & Plan Note (Addendum)
No prior Echo.  Chest xray with pulmonary vascular congestion and initerstitial markings concerning for pulmonary edema.  Suspect underlying OSA/OHS related to obestiy, may have some pulmonary HTN as a result. Echo with normal EF 60 to [id number], grade 1 diastolic dysfunction, mild LVH. --Stop IV Lasix and monitor respiratory / oxygenation / if orthopnea recurs --Monitor renal function and electrolytes --Daily weights to monitor volume status

## 2021-02-04 NOTE — Assessment & Plan Note (Addendum)
Hbg A1c is 7.9%.   Appears not currently on medical therapy. Now with steroid-induced hyperglycemia. --Sliding scale Novolog --Adjust insulin for inpt goal 140-180 --PCP follow up  --Consider oral agent for both glycemic control and weight loss such as liraglutide, if no contraindications

## 2021-02-04 NOTE — Progress Notes (Signed)
PHARMACIST - PHYSICIAN COMMUNICATION  CONCERNING:  Enoxaparin (Lovenox) for DVT Prophylaxis    RECOMMENDATION: Patient was prescribed enoxaprin 40mg  q24 hours for VTE prophylaxis.   Filed Weights   02/04/21 0409  Weight: (!) 158.4 kg (349 lb 3.3 oz)    Body mass index is 60.89 kg/m.  Estimated Creatinine Clearance: 152.2 mL/min (by C-G formula based on SCr of 0.7 mg/dL).   Based on [facility] policy patient is candidate for enoxaparin 0.5mg /kg TBW SQ every 24 hours based on BMI being >30.  DESCRIPTION: Pharmacy has adjusted enoxaparin dose per [facility] policy.  Patient is now receiving enoxaparin 0.5 mg/kg every 24 hours   [facility], PharmD, [facility] 02/04/2021 4:26 AM

## 2021-02-04 NOTE — Assessment & Plan Note (Addendum)
POA, due to asthma exacerbation.  O2 sats on admission were in upper 80's on room air.  Requiring 2 L/min o2 supplementation. Pt was weaned to room air, but noted to desat to 87% while sleeping on 1/12. 1/13: Patient desatted to 81% on room air at one point today. She continues to have significant dyspnea on exertion. --Overnight pulse oximetry  --Supplement O2 to keep sats above 90% --Check O2 sats with ambulation --Qualified for nocturnal O2.   Suspect undiagnosed OSA/OHS given body habitus and family history of sleep apnea. --Recommend outpatient sleep study

## 2021-02-04 NOTE — Assessment & Plan Note (Addendum)
Presented with SOB, wheezing, poor air movement on exam, O2 sat 89% on room air.  Had been seen at urgent care and prescribed prednisone but clinically worsened prior to picking up rx. -- Treated with IV Solu-medrol, transitioned to Prednisone --Pulmicort nebs (sub for home symbicort) --Scheduled Duonebs  --PRN albuterol nebs --Continue home Singulair --O2 support as needed --Mucinex BID, Tussionex PRN

## 2021-02-04 NOTE — TOC Progression Note (Signed)
Transition of Care [facility]) - Progression Note    Patient Details  Name: Yanilen Granade MRN: [id number] Date of Birth: [birth year]  Transition of Care [facility]) CM/SW Contact  Shelbie Hutching, RN Phone Number: 02/04/2021, 8:21 AM  Clinical Narrative:      Transition of Care [facility]) Screening Note   Patient Details  Name: Anilu Councilman Date of Birth: Jan 11, 1991   Transition of Care [facility]) CM/SW Contact:    Shelbie Hutching, RN Phone Number: 02/04/2021, 8:21 AM    Transition of Care Department [facility]) has reviewed patient and no TOC needs have been identified at this time. We will continue to monitor patient advancement through interdisciplinary progression rounds. If new patient transition needs arise, please place a TOC consult.         Expected Discharge Plan and Services                                                 Social Determinants of Health (SDOH) Interventions    Readmission Risk Interventions No flowsheet data found.

## 2021-02-04 NOTE — Progress Notes (Signed)
Progress Note   Patient: Kelsey Mcclure [id number] DOB: 10-03-1990 DOA: 02/03/2021     0 DOS: the patient was seen and examined on 02/04/2021   Brief hospital course: 31 y.o. female with medical history significant for asthma, type 2 diabetes mellitus and eczema, who presented to the ED on 02/03/2021 with complaints of nasal congestion and cough productive of yellow sputum over the last 10 days, worsening shortness of breath and chest tightness.  Seen at urgent care same day, prescribed prednisone. She had run out of her albuterol as well.    In the ED, patient was hypertensive, spO2 was 87% on room air, improved to 97% on 2 L/min supplemental O2.  Negative for Covid-19 and influenza A/B.  Chest x-ray showed mild cardiomegaly with mild central vascular congestion.  Treated with DuoNeb's x3 and 125 mg of IV Solu-Medrol, and admitted to [facility] service for further evaluation and management of acute asthma exacerbation.  Assessment and Plan * Acute asthma exacerbation- (present on admission) Presented with SOB, wheezing, poor air movement on exam, O2 sat 89% on room air.  Had been seen at urgent care and prescribed prednisone but clinically worsened prior to picking up rx. --Continue IV Solu-medrol --Start Pulmicort nebs (sub for home symbicort) --Scheduled Duonebs  --PRN albuterol nebs --Continue home Singulair --O2 support as needed --Mucinex BID, Tussionex PRN   Acute respiratory failure with hypoxia (HCC)- (present on admission) POA, due to asthma exacerbation.  O2 sats on admission were in upper 80's on room air.  Requiring 2 L/min o2 supplementation. Pt was weaned to room air, but noted to desat to 87% while sleeping today. Currently again on 2 L/min. --Supplement O2 to keep sats above 90% --Check O2 sats with ambulation --Continuous pulse ox --May need nocturnal O2.   Suspect undiagnosed OSA/OHS.  Pulmonary vascular congestion- (present on admission) No prior Echo.  Chest xray  with pulmonary vascular congestion and initerstitial markings concerning for pulmonary edema.  Suspect underlying OSA/OHS related to obestiy, may have some pulmonary HTN as a result. --Echo pending, follow --Continue IV Lasix for now --Monitor renal function and electrolytes --Daily weights to monitor volume status  Essential hypertension- (present on admission) BP stable, mildly elevated at times. --Continue HCTZ.  GERD (gastroesophageal reflux disease)- (present on admission) Continue H2 blocker.  Morbid obesity (HCC)- (present on admission) Body mass index is 60.89 kg/m. Complicates overall care and prognosis.  Recommend lifestyle modifications including physical activity and diet for weight loss and overall long-term health. Patient very likely has underlying OSA/OHS.     Type 2 diabetes mellitus with hyperglycemia (HCC)- (present on admission) Hbg A1c is 7.9%.   Appears not currently on medical therapy. Now with steroid-induced hyperglycemia. --Sliding scale Novolog --Adjust insulin for inpt goal 140-180 --PCP follow up  --Consider oral agent for both glycemic control and weight loss such as liraglutide, if no contraindications     Subjective: Pt seen in the ED, holding for a bed.  She continues to have a lot of coughing, but chest feels less congested today.  She was on room air, but per RN in the ED, her O2 sat dropped to 87% while she was sleeping today, placed back on 2 L/min.  Pt denies fever/chills, SOB at rest, chest pain.  Say she is voiding a lot with Lasix.    Objective Vitals reviewed and notable for elevated BP's (diastolic elevated when systolic is normal), HR 90's to 100's, on 2 L/min [place] O2.  Intermittently tachypneic.  General exam: awake,  alert, no acute distress, morbidly obese HEENT: moist mucus membranes, hearing grossly normal  Respiratory system: very poor air movement, no expiratory wheezes, no rhonchi, normal respiratory effort at rest. Cardiovascular  system: normal S1/S2, RRR, unable to appreciate JVD due to body habitus, mild nonpitting BLE edema.   Central nervous system: A&O x3. no gross focal neurologic deficits, normal speech Extremities: moves all, normal tone Skin: dry, intact, normal temperature Psychiatry: normal mood, congruent affect, judgement and insight appear normal   Data Reviewed:  Labs notable for glucose 305, CBG's 353>>286, Hbg A1c 7.9%  Family Communication: None at bedside during encounter.  Disposition: Status is: Observation  The patient remains OBS appropriate and will d/c before 2 midnights.        Time spent: 35 minutes  Author: Pennie Banter, DO 02/04/2021 4:37 PM  For on call review [email or web address].

## 2021-02-04 NOTE — Progress Notes (Signed)
Inpatient Diabetes Program Recommendations  AACE/ADA: New Consensus Statement on Inpatient Glycemic Control (2015)  Target Ranges:  Prepandial:   less than 140 mg/dL      Peak postprandial:   less than 180 mg/dL (1-2 hours)      Critically ill patients:  140 - 180 mg/dL    Latest Reference Range & Units 02/04/21 07:43 02/04/21 11:17  Glucose-Capillary 70 - 99 mg/dL 810 (H)  15 units Novolog  286 (H)  8 units Novolog   (H): Data is abnormally high   Admit Acute asthma exacerbation  History: DM2   Home DM Meds: None listed   Current: Novolog 0-15 units TID ac/hs    Pt got 125 mg Solumedrol X 1 dose yest at 9pm--To start Solumedrol 40 mg BID this AM    MD- Please consider starting basal insulin while pt receiving steroids  Semglee 12 units daily (0.075 units/kg based on weight of 158 kg)    --Will follow patient during hospitalization--  Ambrose Finland RN, MSN, CDE Diabetes Coordinator Inpatient Glycemic Control Team Team Pager: [phone] (8a-5p)

## 2021-02-04 NOTE — Assessment & Plan Note (Addendum)
Body mass index is 60.89 kg/m. Complicates overall care and prognosis.  Recommend lifestyle modifications including physical activity and diet for weight loss and overall long-term health. Patient very likely has underlying OSA/OHS.    Recommend outpatient sleep study.

## 2021-02-04 NOTE — Assessment & Plan Note (Signed)
Continue H2 blocker.  

## 2021-02-04 NOTE — ED Notes (Signed)
Patient is resting comfortably. O2 sats dropped to 88% while sleeping on room air. 2L nasal cannula applies, O2 sats now 95%.

## 2021-02-04 NOTE — Assessment & Plan Note (Addendum)
BP elevated, possibly due at least in part to steroids --Continue HCTZ. -- Added losartan 50 mg daily

## 2021-02-05 ENCOUNTER — Observation Stay
Admit: 2021-02-05 | Discharge: 2021-02-05 | Disposition: A | Discharge status: Home / Self Care | Payer: BLUE CROSS/BLUE SHIELD | Attending: Internal Medicine | Admitting: Internal Medicine

## 2021-02-05 DIAGNOSIS — J4531 Mild persistent asthma with (acute) exacerbation: Secondary | ICD-10-CM | POA: Diagnosis not present

## 2021-02-05 LAB — ECHOCARDIOGRAM COMPLETE
AR max vel: 2.02 cm2
AV Area VTI: 2.32 cm2
AV Area mean vel: 1.88 cm2
AV Mean grad: 3 mmHg
AV Peak grad: 4.8 mmHg
Ao pk vel: 1.1 m/s
Area-P 1/2: 5.66 cm2
Height: 63.5 in
MV VTI: 2.18 cm2
S' Lateral: 2.8 cm
Weight: 5587.34 oz

## 2021-02-05 LAB — BASIC METABOLIC PANEL
Anion gap: 7 (ref 5–15)
BUN: 13 mg/dL (ref 6–20)
CO2: 32 mmol/L (ref 22–32)
Calcium: 9.3 mg/dL (ref 8.9–10.3)
Chloride: 101 mmol/L (ref 98–111)
Creatinine, Ser: 0.76 mg/dL (ref 0.44–1.00)
GFR, Estimated: >60 mL/min (ref >60)
Glucose, Bld: 154 mg/dL — ABNORMAL HIGH (ref 70–99)
Potassium: 4 mmol/L (ref 3.5–5.1)
Sodium: 140 mmol/L (ref 135–145)

## 2021-02-05 LAB — MAGNESIUM: Magnesium: 2.2 mg/dL (ref 1.7–2.4)

## 2021-02-05 LAB — GLUCOSE, CAPILLARY
Glucose-Capillary: 130 mg/dL — ABNORMAL HIGH (ref 70–99)
Glucose-Capillary: 286 mg/dL — ABNORMAL HIGH (ref 70–99)
Glucose-Capillary: 312 mg/dL — ABNORMAL HIGH (ref 70–99)

## 2021-02-05 MED ORDER — LOSARTAN POTASSIUM 50 MG PO TABS
50.0000 mg | ORAL_TABLET | Freq: Every day | ORAL | Status: DC
  Administered 2021-02-05 – 2021-02-06 (×2): 50 mg via ORAL
  Filled 2021-02-05 (×2): qty 1

## 2021-02-05 NOTE — Progress Notes (Signed)
*  PRELIMINARY RESULTS* Echocardiogram 2D Echocardiogram has been performed.  Kelsey Mcclure 02/05/2021, 9:35 AM

## 2021-02-05 NOTE — Progress Notes (Addendum)
Progress Note   Patient: Kelsey Mcclure [id number] DOB: 1991/01/21 DOA: 02/03/2021     0 DOS: the patient was seen and examined on 02/06/2021   Brief hospital course: 31 y.o. female with medical history significant for asthma, type 2 diabetes mellitus and eczema, who presented to the ED on 02/03/2021 with complaints of nasal congestion and cough productive of yellow sputum over the last 10 days, worsening shortness of breath and chest tightness.  Seen at urgent care same day, prescribed prednisone. She had run out of her albuterol as well.    In the ED, patient was hypertensive, spO2 was 87% on room air, improved to 97% on 2 L/min supplemental O2.  Negative for Covid-19 and influenza A/B.  Chest x-ray showed mild cardiomegaly with mild central vascular congestion.  Treated with DuoNeb's x3 and 125 mg of IV Solu-Medrol, and admitted to [facility] service for further evaluation and management of acute asthma exacerbation.  Assessment and Plan * Acute asthma exacerbation- (present on admission) Presented with SOB, wheezing, poor air movement on exam, O2 sat 89% on room air.  Had been seen at urgent care and prescribed prednisone but clinically worsened prior to picking up rx. -- Will continue IV Solu-medrol another 24-48 hours given ongoing dyspnea with exertion --Pulmicort nebs (sub for home symbicort) --Scheduled Duonebs  --PRN albuterol nebs --Continue home Singulair --O2 support as needed --Mucinex BID, Tussionex PRN   Acute respiratory failure with hypoxia ([place])- (present on admission) POA, due to asthma exacerbation.  O2 sats on admission were in upper 80's on room air.  Requiring 2 L/min o2 supplementation. Pt was weaned to room air, but noted to desat to 87% while sleeping on 1/12. 1/13: Patient desatted to 81% on room air at one point today. She continues to have significant dyspnea on exertion. --Overnight pulse oximetry tonight --Supplement O2 to keep sats above 90% --Check O2 sats  with ambulation --Suspect she may need nocturnal O2.   Suspect undiagnosed OSA/OHS given body habitus and family history of sleep apnea.  Pulmonary vascular congestion- (present on admission) No prior Echo.  Chest xray with pulmonary vascular congestion and initerstitial markings concerning for pulmonary edema.  Suspect underlying OSA/OHS related to obestiy, may have some pulmonary HTN as a result. Echo with normal EF 60 to 123456, grade 1 diastolic dysfunction, mild LVH. --Stop IV Lasix and monitor respiratory / oxygenation / if orthopnea recurs --Monitor renal function and electrolytes --Daily weights to monitor volume status  Essential hypertension- (present on admission) BP elevated, possibly due at least in part to steroids --Continue HCTZ. -- Added losartan 50 mg daily  GERD (gastroesophageal reflux disease)- (present on admission) Continue H2 blocker.  Morbid obesity ([place])- (present on admission) Body mass index is 60.89 kg/m. Complicates overall care and prognosis.  Recommend lifestyle modifications including physical activity and diet for weight loss and overall long-term health. Patient very likely has underlying OSA/OHS.    Recommend outpatient sleep study.  Type 2 diabetes mellitus with hyperglycemia ([place])- (present on admission) Hbg A1c is 7.9%.   Appears not currently on medical therapy. Now with steroid-induced hyperglycemia. --Sliding scale Novolog --Adjust insulin for inpt goal 140-180 --PCP follow up  --Consider oral agent for both glycemic control and weight loss such as liraglutide, if no contraindications     Subjective: Patient awake sitting up in bed when seen today.  She was on oxygen overnight but currently on room air this morning.  She reports chronically having more breathing issues in the morning when she  is first waking up.  Says her father had a diagnosis of sleep apnea and she wonders if she has this.  Feels like her breathing is improved since  admission, still with significant dyspnea on exertion, however.  Cough and congestion are improving.  Objective Vitals reviewed and notable for BP elevated 159/113 this morning, BPs have been persistently elevated.  On 2 L/min nasal cannula O2 earlier this morning and on room air this afternoon.  No fevers tachycardia or tachypnea.  General exam: awake, alert, no acute distress, morbidly obese HEENT: atraumatic, clear conjunctiva, anicteric sclera, moist mucus membranes, hearing grossly normal  Respiratory system: Poor air movement, otherwise lungs clear bilaterally, no expiratory wheezes audible, normal respiratory effort at rest, dyspnea on exertion noted. Cardiovascular system: normal S1/S2, RRR Central nervous system: A&O x3. no gross focal neurologic deficits, normal speech Extremities: moves all , no edema, normal tone Skin: dry, intact, normal temperature Psychiatry: normal mood, congruent affect, judgement and insight appear normal   Data Reviewed:  Labs reviewed and notable for glucose 154.  Stable renal function and electrolytes. Echo reviewed: Normal EF 60 to 65%, mild LVH, grade 1 diastolic dysfunction.   Family Communication: None at bedside during encounter.  Patient able to update  Disposition: Status is: Inpatient  Remains inpatient appropriate because: patient continues to have oxygen desaturations, dyspnea on exertion.  Remains on IV steroid and evaluation for home oxygen requirement ongoing.                   Time spent: 35 minutes  Author: Ezekiel Slocumb, DO 02/06/2021 8:22 AM  For on call review [email or web address].

## 2021-02-06 DIAGNOSIS — K219 Gastro-esophageal reflux disease without esophagitis: Secondary | ICD-10-CM | POA: Diagnosis present

## 2021-02-06 DIAGNOSIS — I10 Essential (primary) hypertension: Secondary | ICD-10-CM | POA: Diagnosis present

## 2021-02-06 DIAGNOSIS — Z825 Family history of asthma and other chronic lower respiratory diseases: Secondary | ICD-10-CM | POA: Diagnosis not present

## 2021-02-06 DIAGNOSIS — Z91030 Bee allergy status: Secondary | ICD-10-CM | POA: Diagnosis not present

## 2021-02-06 DIAGNOSIS — R0602 Shortness of breath: Secondary | ICD-10-CM | POA: Diagnosis present

## 2021-02-06 DIAGNOSIS — Z833 Family history of diabetes mellitus: Secondary | ICD-10-CM | POA: Diagnosis not present

## 2021-02-06 DIAGNOSIS — Z91013 Allergy to seafood: Secondary | ICD-10-CM | POA: Diagnosis not present

## 2021-02-06 DIAGNOSIS — J4531 Mild persistent asthma with (acute) exacerbation: Secondary | ICD-10-CM | POA: Diagnosis not present

## 2021-02-06 DIAGNOSIS — Z6841 Body Mass Index (BMI) 40.0 and over, adult: Secondary | ICD-10-CM | POA: Diagnosis not present

## 2021-02-06 DIAGNOSIS — Z91010 Allergy to peanuts: Secondary | ICD-10-CM | POA: Diagnosis not present

## 2021-02-06 DIAGNOSIS — T380X5A Adverse effect of glucocorticoids and synthetic analogues, initial encounter: Secondary | ICD-10-CM | POA: Diagnosis not present

## 2021-02-06 DIAGNOSIS — Z87891 Personal history of nicotine dependence: Secondary | ICD-10-CM | POA: Diagnosis not present

## 2021-02-06 DIAGNOSIS — J9601 Acute respiratory failure with hypoxia: Secondary | ICD-10-CM | POA: Diagnosis present

## 2021-02-06 DIAGNOSIS — L309 Dermatitis, unspecified: Secondary | ICD-10-CM | POA: Diagnosis present

## 2021-02-06 DIAGNOSIS — Z79899 Other long term (current) drug therapy: Secondary | ICD-10-CM | POA: Diagnosis not present

## 2021-02-06 DIAGNOSIS — E662 Morbid (severe) obesity with alveolar hypoventilation: Secondary | ICD-10-CM | POA: Diagnosis present

## 2021-02-06 DIAGNOSIS — J45901 Unspecified asthma with (acute) exacerbation: Secondary | ICD-10-CM | POA: Diagnosis present

## 2021-02-06 DIAGNOSIS — E1165 Type 2 diabetes mellitus with hyperglycemia: Secondary | ICD-10-CM | POA: Diagnosis present

## 2021-02-06 DIAGNOSIS — Z20822 Contact with and (suspected) exposure to covid-19: Secondary | ICD-10-CM | POA: Diagnosis present

## 2021-02-06 LAB — BASIC METABOLIC PANEL
Anion gap: 6 (ref 5–15)
BUN: 18 mg/dL (ref 6–20)
CO2: 31 mmol/L (ref 22–32)
Calcium: 8.8 mg/dL — ABNORMAL LOW (ref 8.9–10.3)
Chloride: 102 mmol/L (ref 98–111)
Creatinine, Ser: 0.84 mg/dL (ref 0.44–1.00)
GFR, Estimated: >60 mL/min (ref >60)
Glucose, Bld: 147 mg/dL — ABNORMAL HIGH (ref 70–99)
Potassium: 4 mmol/L (ref 3.5–5.1)
Sodium: 139 mmol/L (ref 135–145)

## 2021-02-06 LAB — GLUCOSE, CAPILLARY
Glucose-Capillary: 128 mg/dL — ABNORMAL HIGH (ref 70–99)
Glucose-Capillary: 168 mg/dL — ABNORMAL HIGH (ref 70–99)

## 2021-02-06 LAB — CBC
HCT: 40.2 % (ref 36.0–46.0)
Hemoglobin: 12.3 g/dL (ref 12.0–15.0)
MCH: 25.1 pg — ABNORMAL LOW (ref 26.0–34.0)
MCHC: 30.6 g/dL (ref 30.0–36.0)
MCV: 81.9 fL (ref 80.0–100.0)
Platelets: 369 10*3/uL (ref 150–400)
RBC: 4.91 MIL/uL (ref 3.87–5.11)
RDW: 14.9 % (ref 11.5–15.5)
WBC: 13.9 10*3/uL — ABNORMAL HIGH (ref 4.0–10.5)
nRBC: 0 % (ref 0.0–0.2)

## 2021-02-06 MED ORDER — PREDNISONE 10 MG PO TABS
ORAL_TABLET | ORAL | 0 refills | Status: AC
Start: 2021-02-06 — End: 2021-02-09

## 2021-02-06 MED ORDER — ALBUTEROL SULFATE (2.5 MG/3ML) 0.083% IN NEBU: 2.5000 mg | INHALATION_SOLUTION | RESPIRATORY_TRACT | Status: DC | PRN

## 2021-02-06 MED ORDER — METHYLPREDNISOLONE SODIUM SUCC 40 MG IJ SOLR
40.0000 mg | INTRAMUSCULAR | Status: DC
  Administered 2021-02-06: 40 mg via INTRAVENOUS
  Filled 2021-02-06: qty 1

## 2021-02-06 MED ORDER — IRBESARTAN 75 MG PO TABS: 75.0000 mg | ORAL_TABLET | Freq: Every day | ORAL | 2 refills | Status: DC

## 2021-02-06 MED ORDER — IPRATROPIUM-ALBUTEROL 0.5-2.5 (3) MG/3ML IN SOLN
3.0000 mL | Freq: Four times a day (QID) | RESPIRATORY_TRACT | Status: DC
  Administered 2021-02-06: 3 mL via RESPIRATORY_TRACT
  Filled 2021-02-06: qty 3

## 2021-02-06 NOTE — TOC Progression Note (Signed)
Transition of Care [facility]) - Progression Note    Patient Details  Name: Kelsey Mcclure MRN: [id number] Date of Birth: Nov 29, 1990  Transition [facility]) CM/SW Contact  Bing Quarry, RN Phone Number: 02/06/2021, 9:34 AM  Clinical Narrative:  1/14:  Potential discharge to home today. DME home oxygen ordered. [employer] notified per [facility]. Portable condenser will be able to be delivered for use and home set up done tomorrow or Monday. Patient was contacted prior to this and was okay using [employer], gave verbal permission to share needed information for set up, and portable condenser/home set up process explained. Patient had no further questions at this time. Updated provider and Unit RN. Gabriel Cirri RN CM [phone].         Expected Discharge Plan and Services                                                 Social Determinants of Health (SDOH) Interventions    Readmission Risk Interventions No flowsheet data found.
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
## 2021-02-06 NOTE — Progress Notes (Signed)
Discharge instructions and medication details reviewed with patient. All questions answered. Oxygen equipment in the room with patient. Printed AVS and work note given to patient.  IV removed. Patient escorted out via wheelchair.

## 2021-02-16 ENCOUNTER — Other Ambulatory Visit: Payer: Self-pay

## 2021-02-16 ENCOUNTER — Encounter: Payer: Self-pay | Admitting: Emergency Medicine

## 2021-02-16 ENCOUNTER — Emergency Department: Payer: BLUE CROSS/BLUE SHIELD

## 2021-02-16 ENCOUNTER — Emergency Department
Admission: EM | Admit: 2021-02-16 | Discharge: 2021-02-17 | Disposition: A | Discharge status: Home / Self Care | Payer: BLUE CROSS/BLUE SHIELD | Attending: Emergency Medicine | Admitting: Emergency Medicine

## 2021-02-16 DIAGNOSIS — S0990XA Unspecified injury of head, initial encounter: Secondary | ICD-10-CM | POA: Diagnosis present

## 2021-02-16 DIAGNOSIS — M542 Cervicalgia: Secondary | ICD-10-CM | POA: Diagnosis not present

## 2021-02-16 DIAGNOSIS — J45909 Unspecified asthma, uncomplicated: Secondary | ICD-10-CM | POA: Diagnosis not present

## 2021-02-16 DIAGNOSIS — G8911 Acute pain due to trauma: Secondary | ICD-10-CM | POA: Diagnosis not present
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
## 2021-02-17 NOTE — ED Notes (Signed)
E signature pad not working. Pt educated on discharge instrcutions and verbalized understanding

## 2021-02-17 NOTE — Discharge Instructions (Signed)

## 2021-02-28 ENCOUNTER — Other Ambulatory Visit: Payer: Self-pay

## 2021-02-28 ENCOUNTER — Ambulatory Visit
Admission: RE | Admit: 2021-02-28 | Discharge: 2021-02-28 | Disposition: A | Discharge status: Home / Self Care | Payer: BLUE CROSS/BLUE SHIELD | Source: Ambulatory Visit | Attending: Family Medicine | Admitting: Family Medicine

## 2021-02-28 VITALS — BP 169/90 | HR 88 | Temp 99.0°F | Resp 18

## 2021-02-28 DIAGNOSIS — R0602 Shortness of breath: Secondary | ICD-10-CM

## 2021-02-28 DIAGNOSIS — J019 Acute sinusitis, unspecified: Secondary | ICD-10-CM | POA: Diagnosis not present

## 2021-02-28 DIAGNOSIS — J449 Chronic obstructive pulmonary disease, unspecified: Secondary | ICD-10-CM

## 2021-02-28 DIAGNOSIS — J4551 Severe persistent asthma with (acute) exacerbation: Secondary | ICD-10-CM | POA: Diagnosis not present

## 2021-02-28 DIAGNOSIS — J4541 Moderate persistent asthma with (acute) exacerbation: Secondary | ICD-10-CM | POA: Diagnosis not present

## 2021-02-28 MED ORDER — ALBUTEROL SULFATE (2.5 MG/3ML) 0.083% IN NEBU
2.5000 mg | INHALATION_SOLUTION | RESPIRATORY_TRACT | Status: AC
  Administered 2021-02-28: 2.5 mg via RESPIRATORY_TRACT

## 2021-02-28 MED ORDER — ALBUTEROL SULFATE HFA 108 (90 BASE) MCG/ACT IN AERS: 2.0000 | INHALATION_SPRAY | RESPIRATORY_TRACT | 0 refills | Status: DC | PRN

## 2021-02-28 MED ORDER — IPRATROPIUM BROMIDE 0.02 % IN SOLN
0.5000 mg | Freq: Four times a day (QID) | RESPIRATORY_TRACT | 0 refills | Status: DC | PRN
Start: 2021-02-28 — End: 2021-03-19

## 2021-02-28 MED ORDER — ALBUTEROL SULFATE (2.5 MG/3ML) 0.083% IN NEBU: 2.5000 mg | INHALATION_SOLUTION | Freq: Four times a day (QID) | RESPIRATORY_TRACT | 0 refills | Status: DC | PRN

## 2021-02-28 MED ORDER — DOXYCYCLINE HYCLATE 100 MG PO CAPS: 100.0000 mg | ORAL_CAPSULE | Freq: Two times a day (BID) | ORAL | 0 refills | Status: DC

## 2021-02-28 MED ORDER — PREDNISONE 20 MG PO TABS: 40.0000 mg | ORAL_TABLET | Freq: Every day | ORAL | 0 refills | Status: DC

## 2021-02-28 NOTE — ED Provider Notes (Signed)
Kelsey Mcclure    CSN: [id number] Arrival date & time: 02/28/21  1017      History   Chief Complaint Chief Complaint  Patient presents with   Cough   Shortness of Breath    HPI Kelsey Mcclure is a 31 y.o. female.   HPI Patient presents today with an acute on chronic asthma exacerbation.  Patient has been seen here at urgent care 3 times including today's visit for an asthma exacerbation and has had 2 hospital admissions during the month of December.  Patient is currently without a PCP although has attained an appointment which she reports she has not been to be able to make the appointment and will need to reschedule.  Patient does not and has not had any specialty evaluation of her asthma.  She reports she has been on the same medication regiment for an extended period of time.  Her insurance is no longer covering the Symbicort and she is unable to afford the outpatient cost.  She reports that she is having a persistent cough along with nasal symptoms which have been present for over a week.  She is significantly short of breath and her oxygen level on arrival today was 93%.  She denies any fever.  Past Medical History:  Diagnosis Date   Asthma    Diabetes mellitus without complication ([place])    Eczema     Patient Active Problem List   Diagnosis Date Noted   Essential hypertension 02/04/2021   GERD (gastroesophageal reflux disease) 02/04/2021   Pulmonary vascular congestion 02/04/2021   Acute asthma exacerbation 02/03/2021   Diabetes mellitus without complication ([place])    Acute respiratory failure with hypoxia ([place]) 12/15/2019   Type 2 diabetes mellitus with hyperglycemia ([place]) 10/26/2018   Morbid obesity ([place]) 10/26/2018   Depression 10/26/2018   Asthma exacerbation 10/26/2018   Elevated BP without diagnosis of hypertension 10/26/2018   Posterior subcapsular polar cataract, nonsenile 12/08/2017    Past Surgical History:  Procedure Laterality Date   CATARACT  EXTRACTION, BILATERAL      OB History   No obstetric history on file.      Home Medications    Prior to Admission medications   Medication Sig Start Date End Date Taking? Authorizing Provider  albuterol (PROVENTIL) (2.5 MG/3ML) 0.083% nebulizer solution Take 3 mLs (2.5 mg total) by nebulization every 6 (six) hours as needed for wheezing or shortness of breath. 02/03/21   Hughie Closs, PA-C  albuterol (VENTOLIN HFA) 108 (90 Base) MCG/ACT inhaler Inhale 2 puffs into the lungs every 4 (four) hours as needed for shortness of breath or wheezing. 02/03/21   Hughie Closs, PA-C  blood glucose meter kit and supplies KIT Dispense based on patient and insurance preference. Use up to four times daily as directed. 02/03/21   Hughie Closs, PA-C  budesonide-formoterol (SYMBICORT) 160-4.5 MCG/ACT inhaler Inhale 2 puffs into the lungs 2 (two) times daily. 02/03/21   Hughie Closs, PA-C  DM-APAP-CPM (CORICIDIN HBP PO) Take 2 tablets by mouth every other day as needed. For cold, flu and chest congestion.    [provider]  EPINEPHrine 0.3 mg/0.3 mL IJ SOAJ injection Inject 0.3 mg into the muscle as needed for anaphylaxis.    [provider]  famotidine (PEPCID) 20 MG tablet Take 1 tablet (20 mg total) by mouth daily for 14 days. 12/06/20 12/20/20  Nolberto Hanlon, MD  Fiber Adult Gummies 2 g CHEW Chew 4 tablets by mouth daily.  [provider]  halobetasol (ULTRAVATE) 0.05 % cream Apply 1 application topically 2 (two) times daily. Apply topically twice daily after showers    [provider]  irbesartan (AVAPRO) 75 MG tablet Take 1 tablet (75 mg total) by mouth daily. 02/06/21   Nicole Kindred A, DO  montelukast (SINGULAIR) 10 MG tablet Take 10 mg by mouth daily.    [provider]    Family History Family History  Problem Relation Age of Onset   Eczema Mother    Diabetes Father    Psoriasis Father    Eczema Father    Sleep apnea Father     Asthma Sister    Eczema Sister    Eczema Sister     Social History Social History   Tobacco Use   Smoking status: Former    Packs/day: 0.50    Types: Cigarettes    Quit date: 04/16/2019    Years since quitting: 1.8   Smokeless tobacco: Never  Vaping Use   Vaping Use: Never used  Substance Use Topics   Alcohol use: Yes    Alcohol/week: 4.0 standard drinks    Types: 4 Shots of liquor per week   Drug use: Yes    Types: Marijuana     Allergies   Bee venom, Peanut oil, and Shellfish allergy   Review of Systems Review of Systems Pertinent negatives listed in HPI  Physical Exam Triage Vital Signs ED Triage Vitals  Enc Vitals Group     BP 02/28/21 1052 (!) 169/90     Pulse Rate 02/28/21 1052 88     Resp 02/28/21 1052 18     Temp 02/28/21 1052 99 F (37.2 C)     Temp src --      SpO2 02/28/21 1052 93 %     Weight --      Height --      Head Circumference --      Peak Flow --      Pain Score 02/28/21 1050 0     Pain Loc --      Pain Edu? --      Excl. in [place]? --    No data found.  Updated Vital Signs BP (!) 169/90 (BP Location: Right Wrist) Comment: Pt did not take BP meds today   Pulse 88    Temp 99 F (37.2 C)    Resp 18    SpO2 93%   Visual Acuity Right Eye Distance:   Left Eye Distance:   Bilateral Distance:    Right Eye Near:   Left Eye Near:    Bilateral Near:     Physical Exam Constitutional:      Appearance: She is ill-appearing.  HENT:     Head: Normocephalic and atraumatic.  Eyes:     Extraocular Movements: Extraocular movements intact.     Pupils: Pupils are equal, round, and reactive to light.  Cardiovascular:     Rate and Rhythm: Normal rate and regular rhythm.  Pulmonary:     Effort: Tachypnea present.     Breath sounds: Examination of the right-upper field reveals wheezing and rhonchi. Examination of the left-upper field reveals wheezing and rhonchi. Examination of the right-middle field reveals wheezing. Examination of the  left-middle field reveals wheezing. Wheezing and rhonchi present.  Skin:    Capillary Refill: Capillary refill takes less than 2 seconds.  Neurological:     General: No focal deficit present.     Mental Status: She is alert.  Psychiatric:        Mood and Affect: Mood normal.        Behavior: Behavior normal.   UC Treatments / Results  Labs (all labs ordered are listed, but only abnormal results are displayed) Labs Reviewed - No data to display  EKG   Radiology No results found.  Procedures Procedures (including critical care time)  Medications Ordered in UC Medications  albuterol (PROVENTIL) (2.5 MG/3ML) 0.083% nebulizer solution 2.5 mg (2.5 mg Nebulization Given 02/28/21 1116)    Initial Impression / Assessment and Plan / UC Course  I have reviewed the triage vital signs and the nursing notes.  Pertinent labs & imaging results that were available during my care of the patient were reviewed by me and considered in my medical decision making (see chart for details).    Treating acute sinusitis with doxycycline. Chronic asthma patient received an nebulizer treatment while here in clinic as she was diffusely wheezing upon arrival.  I have refilled all of her asthma medications but also referred her to pulmonology.  Patient has had 3 visits within the last 2 months.  Urgent care for management of her chronic asthma.  For acute exacerbation I have prescribed prednisone and added ipratropium nebulizer solution to her regimen to perform home duo nebulizer treatments.  Patient advised to go to the ER if any of her symptoms worsen.  Patient is also encouraged to keep follow-up with pulmonology. Final Clinical Impressions(s) / UC Diagnoses   Final diagnoses:  Shortness of breath  Acute non-recurrent sinusitis, unspecified location  Chronic obstructive airway disease with asthma [facility])   Discharge Instructions   None    ED Prescriptions     Medication Sig Dispense Auth. Provider    ipratropium (ATROVENT) 0.02 % nebulizer solution Take 2.5 mLs (0.5 mg total) by nebulization every 6 (six) hours as needed for wheezing or shortness of breath. 360 mL Scot Jun, FNP   albuterol (VENTOLIN HFA) 108 (90 Base) MCG/ACT inhaler Inhale 2 puffs into the lungs every 4 (four) hours as needed for shortness of breath or wheezing. 18 g Scot Jun, FNP   albuterol (PROVENTIL) (2.5 MG/3ML) 0.083% nebulizer solution Take 3 mLs (2.5 mg total) by nebulization every 6 (six) hours as needed for wheezing or shortness of breath. 360 mL Scot Jun, FNP   predniSONE (DELTASONE) 20 MG tablet Take 2 tablets (40 mg total) by mouth daily with breakfast. 10 tablet Scot Jun, FNP   doxycycline (VIBRAMYCIN) 100 MG capsule Take 1 capsule (100 mg total) by mouth 2 (two) times daily. 20 capsule Scot Jun, FNP      PDMP not reviewed this encounter.   Scot Jun, FNP 02/28/21 1146

## 2021-02-28 NOTE — Discharge Instructions (Signed)
You have been referred to pulmonology.  Someone from their office will contact you and schedule an appointment for specialty evaluation and management of your asthma.  Contact your primary care provider to reschedule the appointment you are unable to make this week.  I have refilled your meds and also prescribed ipratropium which you can mix with the albuterol every 6 hours for a DuoNeb treatment.

## 2021-02-28 NOTE — ED Triage Notes (Signed)
Pt presents with cough, chest congestion, and SOB x 1 week.

## 2021-03-04 ENCOUNTER — Ambulatory Visit: Payer: Medicaid Other | Admitting: Nurse Practitioner

## 2021-03-19 ENCOUNTER — Ambulatory Visit (INDEPENDENT_AMBULATORY_CARE_PROVIDER_SITE_OTHER): Payer: BLUE CROSS/BLUE SHIELD | Admitting: Nurse Practitioner

## 2021-03-19 ENCOUNTER — Other Ambulatory Visit: Payer: Self-pay

## 2021-03-19 ENCOUNTER — Ambulatory Visit: Payer: BLUE CROSS/BLUE SHIELD | Admitting: Nurse Practitioner

## 2021-03-19 ENCOUNTER — Encounter: Payer: Self-pay | Admitting: Nurse Practitioner

## 2021-03-19 VITALS — BP 162/115 | HR 84 | Ht 63.5 in | Wt 355.2 lb

## 2021-03-19 DIAGNOSIS — E1165 Type 2 diabetes mellitus with hyperglycemia: Secondary | ICD-10-CM | POA: Diagnosis not present

## 2021-03-19 DIAGNOSIS — E119 Type 2 diabetes mellitus without complications: Secondary | ICD-10-CM

## 2021-03-19 DIAGNOSIS — J45909 Unspecified asthma, uncomplicated: Secondary | ICD-10-CM | POA: Diagnosis not present

## 2021-03-19 DIAGNOSIS — R5383 Other fatigue: Secondary | ICD-10-CM

## 2021-03-19 DIAGNOSIS — Z7689 Persons encountering health services in other specified circumstances: Secondary | ICD-10-CM

## 2021-03-19 DIAGNOSIS — J4541 Moderate persistent asthma with (acute) exacerbation: Secondary | ICD-10-CM

## 2021-03-19 DIAGNOSIS — Z6841 Body Mass Index (BMI) 40.0 and over, adult: Secondary | ICD-10-CM

## 2021-03-19 DIAGNOSIS — I10 Essential (primary) hypertension: Secondary | ICD-10-CM

## 2021-03-19 LAB — GLUCOSE, POCT (MANUAL RESULT ENTRY): POC Glucose: 147 mg/dl — AB (ref 70–99)

## 2021-03-19 MED ORDER — LOSARTAN POTASSIUM-HCTZ 100-12.5 MG PO TABS: 1.0000 | ORAL_TABLET | Freq: Every day | ORAL | 1 refills | Status: DC

## 2021-03-19 MED ORDER — MOMETASONE FURO-FORMOTEROL FUM 100-5 MCG/ACT IN AERO: 2.0000 | INHALATION_SPRAY | Freq: Two times a day (BID) | RESPIRATORY_TRACT | 2 refills | Status: DC

## 2021-03-19 MED ORDER — METFORMIN HCL 500 MG PO TABS: 500.0000 mg | ORAL_TABLET | Freq: Two times a day (BID) | ORAL | 3 refills | Status: DC

## 2021-03-19 NOTE — Assessment & Plan Note (Signed)
Screening labs ordered

## 2021-03-19 NOTE — Assessment & Plan Note (Addendum)
Pt is only on a recuse inhaler albuterol. She is not on the maintenance inhaler. Her insurance does not covers symbicort, advair or breo. Prescribed Dulera twice daily.

## 2021-03-19 NOTE — Assessment & Plan Note (Addendum)
Last A1c 7.9  Patient is currently taking no medications. Would add metformin 500 mg twice a day. Advised pt to watch diet and perform regular physical activity.

## 2021-03-19 NOTE — Progress Notes (Signed)
New Patient Office Visit  Subjective:  Patient ID: Kelsey Mcclure, female    DOB: 1990-07-19  Age: 31 y.o. MRN: [id number]  CC:  Chief Complaint  Patient presents with   New Patient (Initial Visit)    HPI Kelsey Mcclure presents for establishing care. Her previous PCP was at [facility].    Past Medical History:  Diagnosis Date   Asthma    Diabetes mellitus without complication ([place])    Eczema     Past Surgical History:  Procedure Laterality Date   CATARACT EXTRACTION, BILATERAL      Family History  Problem Relation Age of Onset   Eczema Mother    Diabetes Father    Psoriasis Father    Eczema Father    Sleep apnea Father    Asthma Sister    Eczema Sister    Eczema Sister     Social History   Socioeconomic History   Marital status: Single    Spouse name: Not on file   Number of children: Not on file   Years of education: Not on file   Highest education level: Not on file  Occupational History   Not on file  Tobacco Use   Smoking status: Former    Packs/day: 0.50    Types: Cigarettes    Quit date: 04/16/2019    Years since quitting: 1.9   Smokeless tobacco: Never  Vaping Use   Vaping Use: Never used  Substance and Sexual Activity   Alcohol use: Yes    Alcohol/week: 4.0 standard drinks    Types: 4 Shots of liquor per week   Drug use: Yes    Types: Marijuana   Sexual activity: Not Currently  Other Topics Concern   Not on file  Social History Narrative   Not on file   Social Determinants of Health   Financial Resource Strain: Not on file  Food Insecurity: Not on file  Transportation Needs: Not on file  Physical Activity: Not on file  Stress: Not on file  Social Connections: Not on file  Intimate Partner Violence: Not on file    ROS Review of Systems  Constitutional:  Positive for fatigue.  HENT:  Positive for congestion and ear discharge.   Respiratory:  Positive for shortness of breath.   Cardiovascular:  Positive  for chest pain.  Gastrointestinal:  Negative for abdominal pain and anal bleeding.  Genitourinary:  Positive for frequency. Negative for difficulty urinating and dysuria.  Musculoskeletal:  Positive for back pain and neck pain.  Skin:  Negative for color change and pallor.  Neurological:  Positive for headaches. Negative for dizziness.  Psychiatric/Behavioral:  Positive for agitation and sleep disturbance. The patient is nervous/anxious and is hyperactive.    Objective:   Today's Vitals: BP (!) 162/115    Pulse 84    Ht 5' 3.5" (1.613 m)    Wt (!) 355 lb 3.2 oz (161.1 kg)    BMI 61.93 kg/m   Physical Exam Constitutional:      Appearance: Normal appearance. She is obese.  HENT:     Head: Normocephalic.     Right Ear: Tympanic membrane normal.     Left Ear: Tympanic membrane normal.     Nose: Nose normal.     Mouth/Throat:     Mouth: Mucous membranes are moist.  Eyes:     Pupils: Pupils are equal, round, and reactive to light.  Cardiovascular:     Rate and Rhythm: Normal rate and regular rhythm.  Pulmonary:     Effort: Pulmonary effort is normal.     Breath sounds: Wheezing present.  Abdominal:     General: Bowel sounds are normal.     Palpations: Abdomen is soft.  Skin:    General: Skin is warm.     Capillary Refill: Capillary refill takes less than 2 seconds.  Neurological:     Mental Status: She is alert and oriented to person, place, and time. Mental status is at baseline.  Psychiatric:        Mood and Affect: Mood normal.        Behavior: Behavior normal.        Thought Content: Thought content normal.        Judgment: Judgment normal.    Assessment & Plan:   Problem List Items Addressed This Visit       Cardiovascular and Mediastinum   Essential hypertension (Chronic)    BP 162/115  Pt has not taken her blood pressure medication today. She was taking irbesartan 75 mg. Discontinued irbesartan. Started pt on losartan-HTCZ 100-12.5       Relevant  Medications   losartan-hydrochlorothiazide (HYZAAR) 100-12.5 MG tablet     Respiratory   Asthma    Pt is only on a recuse inhaler albuterol. She is not on the maintenance inhaler. Her insurance does not covers symbicort, advair or breo. Prescribed Dulera twice daily.      Relevant Medications   mometasone-formoterol (DULERA) 100-5 MCG/ACT AERO   Other Relevant Orders   Ambulatory referral to Pulmonology     Endocrine   Type 2 diabetes mellitus with hyperglycemia (HCC)    Last A1c 7.9  Patient is currently taking no medications. Would add metformin 500 mg twice a day. Advised pt to watch diet and perform regular physical activity.       Relevant Medications   metFORMIN (GLUCOPHAGE) 500 MG tablet   losartan-hydrochlorothiazide (HYZAAR) 100-12.5 MG tablet   Other Relevant Orders   Lipid panel   Microalbumin, urine     Other   Morbid obesity ([place])    BMI 61.93 Advise pt to lose weight. Eat heart healthy diet and follow regular exercise schedule.      Relevant Medications   metFORMIN (GLUCOPHAGE) 500 MG tablet   Establishing care with new doctor, encounter for - Primary    Screening labs ordered.      Other Visit Diagnoses     Type 2 diabetes mellitus without complication, without long-term current use of insulin (HCC)       Relevant Medications   metFORMIN (GLUCOPHAGE) 500 MG tablet   losartan-hydrochlorothiazide (HYZAAR) 100-12.5 MG tablet   Other Relevant Orders   POCT glucose (manual entry) (Completed)   Other fatigue       Relevant Orders   TSH   Class 3 severe obesity with serious comorbidity and body mass index (BMI) of 60.0 to 69.9 in adult, unspecified obesity type ([place])       Relevant Medications   metFORMIN (GLUCOPHAGE) 500 MG tablet       Outpatient Encounter Medications as of 03/19/2021  Medication Sig   albuterol (PROVENTIL) (2.5 MG/3ML) 0.083% nebulizer solution Take 3 mLs (2.5 mg total) by nebulization every 6 (six) hours as needed for  wheezing or shortness of breath.   albuterol (VENTOLIN HFA) 108 (90 Base) MCG/ACT inhaler Inhale 2 puffs into the lungs every 4 (four) hours as needed for shortness of breath or wheezing.   blood glucose meter kit and supplies KIT Dispense  based on patient and insurance preference. Use up to four times daily as directed.   DM-APAP-CPM (CORICIDIN HBP PO) Take 2 tablets by mouth every other day as needed. For cold, flu and chest congestion.   doxycycline (VIBRAMYCIN) 100 MG capsule Take 1 capsule (100 mg total) by mouth 2 (two) times daily.   EPINEPHrine 0.3 mg/0.3 mL IJ SOAJ injection Inject 0.3 mg into the muscle as needed for anaphylaxis.   Fiber Adult Gummies 2 g CHEW Chew 4 tablets by mouth daily.   halobetasol (ULTRAVATE) 0.05 % cream Apply 1 application topically 2 (two) times daily. Apply topically twice daily after showers   losartan-hydrochlorothiazide (HYZAAR) 100-12.5 MG tablet Take 1 tablet by mouth daily.   metFORMIN (GLUCOPHAGE) 500 MG tablet Take 1 tablet (500 mg total) by mouth 2 (two) times daily with a meal.   mometasone-formoterol (DULERA) 100-5 MCG/ACT AERO Inhale 2 puffs into the lungs 2 (two) times daily.   montelukast (SINGULAIR) 10 MG tablet Take 10 mg by mouth daily.   [DISCONTINUED] irbesartan (AVAPRO) 75 MG tablet Take 1 tablet (75 mg total) by mouth daily.   [DISCONTINUED] budesonide-formoterol (SYMBICORT) 160-4.5 MCG/ACT inhaler Inhale 2 puffs into the lungs 2 (two) times daily.   [DISCONTINUED] famotidine (PEPCID) 20 MG tablet Take 1 tablet (20 mg total) by mouth daily for 14 days.   [DISCONTINUED] ipratropium (ATROVENT) 0.02 % nebulizer solution Take 2.5 mLs (0.5 mg total) by nebulization every 6 (six) hours as needed for wheezing or shortness of breath.   [DISCONTINUED] predniSONE (DELTASONE) 20 MG tablet Take 2 tablets (40 mg total) by mouth daily with breakfast.   No facility-administered encounter medications on file as of 03/19/2021.    Follow-up: Return in  about 1 week (around 03/26/2021).   Theresia Lo, NP

## 2021-03-19 NOTE — Assessment & Plan Note (Signed)
BMI 61.93 Advise pt to lose weight. Eat heart healthy diet and follow regular exercise schedule.

## 2021-03-19 NOTE — Assessment & Plan Note (Signed)
BP 162/115  Pt has not taken her blood pressure medication today. She was taking irbesartan 75 mg. Discontinued irbesartan. Started pt on losartan-HTCZ 100-12.5

## 2021-03-22 ENCOUNTER — Other Ambulatory Visit: Payer: Self-pay | Admitting: Family Medicine

## 2021-03-23 ENCOUNTER — Other Ambulatory Visit: Payer: Self-pay | Admitting: *Deleted

## 2021-03-23 MED ORDER — ALBUTEROL SULFATE (2.5 MG/3ML) 0.083% IN NEBU: 2.5000 mg | INHALATION_SOLUTION | Freq: Four times a day (QID) | RESPIRATORY_TRACT | 6 refills | Status: DC | PRN

## 2021-03-25 ENCOUNTER — Other Ambulatory Visit: Payer: Self-pay

## 2021-03-25 DIAGNOSIS — J4541 Moderate persistent asthma with (acute) exacerbation: Secondary | ICD-10-CM

## 2021-03-25 MED ORDER — ALBUTEROL SULFATE HFA 108 (90 BASE) MCG/ACT IN AERS: 2.0000 | INHALATION_SPRAY | RESPIRATORY_TRACT | 3 refills | Status: DC | PRN

## 2021-03-25 NOTE — Addendum Note (Signed)
Addended by: Jobie Quaker on: 03/25/2021 12:48 PM   Modules accepted: Orders

## 2021-03-26 ENCOUNTER — Other Ambulatory Visit: Payer: Self-pay

## 2021-03-26 ENCOUNTER — Ambulatory Visit: Payer: BLUE CROSS/BLUE SHIELD

## 2021-03-29 LAB — LIPID PANEL
Cholesterol: 188 mg/dL (ref <200)
HDL: 51 mg/dL (ref >=50)
LDL Cholesterol (Calc): 115 mg/dL (calc) — ABNORMAL HIGH
Non-HDL Cholesterol (Calc): 137 mg/dL (calc) — ABNORMAL HIGH (ref <130)
Total CHOL/HDL Ratio: 3.7 (calc) (ref <5.0)
Triglycerides: 112 mg/dL (ref <150)

## 2021-03-29 LAB — EXTRA LAV TOP TUBE

## 2021-03-29 LAB — TSH: TSH: 3.66 mIU/L

## 2021-03-29 LAB — MICROALBUMIN, URINE

## 2021-04-02 ENCOUNTER — Other Ambulatory Visit: Payer: Self-pay

## 2021-04-02 ENCOUNTER — Ambulatory Visit (INDEPENDENT_AMBULATORY_CARE_PROVIDER_SITE_OTHER): Payer: BLUE CROSS/BLUE SHIELD | Admitting: Nurse Practitioner

## 2021-04-02 ENCOUNTER — Encounter: Payer: Self-pay | Admitting: Nurse Practitioner

## 2021-04-02 VITALS — BP 140/96 | HR 92 | Ht 63.5 in | Wt 349.5 lb

## 2021-04-02 DIAGNOSIS — R040 Epistaxis: Secondary | ICD-10-CM

## 2021-04-02 DIAGNOSIS — J4530 Mild persistent asthma, uncomplicated: Secondary | ICD-10-CM

## 2021-04-02 DIAGNOSIS — I10 Essential (primary) hypertension: Secondary | ICD-10-CM

## 2021-04-02 DIAGNOSIS — E1165 Type 2 diabetes mellitus with hyperglycemia: Secondary | ICD-10-CM | POA: Diagnosis not present

## 2021-04-02 MED ORDER — BLOOD GLUCOSE METER KIT: PACK | 0 refills | Status: AC

## 2021-04-02 NOTE — Progress Notes (Signed)
Established Patient Office Visit  Subjective:  Patient ID: Kelsey Mcclure, female    DOB: 1990-10-21  Age: 31 y.o. MRN: [id number]  CC:  Chief Complaint  Patient presents with   Lab Results     HPI  Kelsey Mcclure presents for lab review. She has lost 6 lb in 2 weeks. She is working on her diet and trying to eat more healthy food. She is cutting down on her juices and fast food.  Patient reports she has occasional nose bleed from 3 years this is a chronic issue that starts in spring season and gets better in fall. She was seen at the ED 3 years ago for the issue and was told that she had polyps. Patient wants to see a ENT specialist.  Patient LDL is borderline high 115.  HPI   Past Medical History:  Diagnosis Date   Asthma    Diabetes mellitus without complication ([place])    Eczema     Past Surgical History:  Procedure Laterality Date   CATARACT EXTRACTION, BILATERAL      Family History  Problem Relation Age of Onset   Eczema Mother    Diabetes Father    Psoriasis Father    Eczema Father    Sleep apnea Father    Asthma Sister    Eczema Sister    Eczema Sister     Social History   Socioeconomic History   Marital status: Single    Spouse name: Not on file   Number of children: Not on file   Years of education: Not on file   Highest education level: Not on file  Occupational History   Not on file  Tobacco Use   Smoking status: Former    Packs/day: 0.50    Types: Cigarettes    Quit date: 04/16/2019    Years since quitting: 1.9   Smokeless tobacco: Never  Vaping Use   Vaping Use: Never used  Substance and Sexual Activity   Alcohol use: Yes    Alcohol/week: 4.0 standard drinks    Types: 4 Shots of liquor per week   Drug use: Yes    Types: Marijuana   Sexual activity: Not Currently  Other Topics Concern   Not on file  Social History Narrative   Not on file   Social Determinants of Health   Financial Resource Strain: Not on file  Food  Insecurity: Not on file  Transportation Needs: Not on file  Physical Activity: Not on file  Stress: Not on file  Social Connections: Not on file  Intimate Partner Violence: Not on file     Outpatient Medications Prior to Visit  Medication Sig Dispense Refill   albuterol (PROVENTIL) (2.5 MG/3ML) 0.083% nebulizer solution Take 3 mLs (2.5 mg total) by nebulization every 6 (six) hours as needed for wheezing or shortness of breath. 360 mL 6   albuterol (VENTOLIN HFA) 108 (90 Base) MCG/ACT inhaler Inhale 2 puffs into the lungs every 4 (four) hours as needed for shortness of breath or wheezing. 18 g 3   blood glucose meter kit and supplies KIT Dispense based on patient and insurance preference. Use up to four times daily as directed. 1 each 0   DM-APAP-CPM (CORICIDIN HBP PO) Take 2 tablets by mouth every other day as needed. For cold, flu and chest congestion.     doxycycline (VIBRAMYCIN) 100 MG capsule Take 1 capsule (100 mg total) by mouth 2 (two) times daily. 20 capsule 0   EPINEPHrine 0.3  mg/0.3 mL IJ SOAJ injection Inject 0.3 mg into the muscle as needed for anaphylaxis.     Fiber Adult Gummies 2 g CHEW Chew 4 tablets by mouth daily.     halobetasol (ULTRAVATE) 0.05 % cream Apply 1 application topically 2 (two) times daily. Apply topically twice daily after showers     losartan-hydrochlorothiazide (HYZAAR) 100-12.5 MG tablet Take 1 tablet by mouth daily. 90 tablet 1   metFORMIN (GLUCOPHAGE) 500 MG tablet Take 1 tablet (500 mg total) by mouth 2 (two) times daily with a meal. 180 tablet 3   mometasone-formoterol (DULERA) 100-5 MCG/ACT AERO Inhale 2 puffs into the lungs 2 (two) times daily. 13 g 2   montelukast (SINGULAIR) 10 MG tablet Take 10 mg by mouth daily.     No facility-administered medications prior to visit.    Allergies  Allergen Reactions   Bee Venom Anaphylaxis   Peanut Oil Anaphylaxis   Shellfish Allergy Hives    ROS Review of Systems  Constitutional:  Negative for  activity change and appetite change.  HENT:  Positive for nosebleeds. Negative for congestion and facial swelling.   Eyes: Negative.   Respiratory:  Negative for cough and shortness of breath.   Cardiovascular:  Negative for chest pain and palpitations.  Gastrointestinal:  Negative for abdominal distention and constipation.  Endocrine: Negative.   Genitourinary: Negative.  Negative for difficulty urinating and frequency.  Musculoskeletal: Negative.   Skin: Negative.   Allergic/Immunologic: Positive for environmental allergies.  Neurological:  Negative for dizziness and headaches.  Psychiatric/Behavioral:  Negative for agitation, behavioral problems and confusion.      Objective:    Physical Exam Constitutional:      Appearance: Normal appearance. She is obese.  HENT:     Head: Normocephalic.     Right Ear: Tympanic membrane normal.     Left Ear: Tympanic membrane normal.     Nose:     Right Turbinates: Enlarged and swollen.     Left Turbinates: Enlarged and swollen.     Mouth/Throat:     Mouth: Mucous membranes are moist.  Eyes:     Pupils: Pupils are equal, round, and reactive to light.  Cardiovascular:     Rate and Rhythm: Normal rate and regular rhythm.  Pulmonary:     Effort: Pulmonary effort is normal.     Breath sounds: Normal breath sounds.  Abdominal:     General: Bowel sounds are normal.     Palpations: Abdomen is soft.  Musculoskeletal:        General: Normal range of motion.     Cervical back: Normal range of motion.  Skin:    General: Skin is warm.     Capillary Refill: Capillary refill takes less than 2 seconds.  Neurological:     General: No focal deficit present.     Mental Status: She is alert and oriented to person, place, and time. Mental status is at baseline.  Psychiatric:        Mood and Affect: Mood normal.        Thought Content: Thought content normal.        Judgment: Judgment normal.    BP (!) 140/96    Pulse 92    Ht 5' 3.5" (1.613 m)     Wt (!) 349 lb 8 oz (158.5 kg)    BMI 60.94 kg/m  Wt Readings from Last 3 Encounters:  04/02/21 (!) 349 lb 8 oz (158.5 kg)  03/19/21 (!) 355 lb 3.2 oz (  161.1 kg)  02/16/21 (!) 340 lb (154.2 kg)     Health Maintenance Due  Topic Date Due   COVID-19 Vaccine (1) Never done   Hepatitis C Screening  Never done   TETANUS/TDAP  01/10/2012   FOOT EXAM  10/22/2019   OPHTHALMOLOGY EXAM  11/08/2019    There are no preventive care reminders to display for this patient.  Lab Results  Component Value Date   TSH 3.66 03/26/2021   Lab Results  Component Value Date   WBC 13.9 (H) 02/06/2021   HGB 12.3 02/06/2021   HCT 40.2 02/06/2021   MCV 81.9 02/06/2021   PLT 369 02/06/2021   Lab Results  Component Value Date   NA 139 02/06/2021   K 4.0 02/06/2021   CO2 31 02/06/2021   GLUCOSE 147 (H) 02/06/2021   BUN 18 02/06/2021   CREATININE 0.84 02/06/2021   BILITOT 0.6 12/05/2020   ALKPHOS 72 12/05/2020   AST 24 12/05/2020   ALT 23 12/05/2020   PROT 8.2 (H) 12/05/2020   ALBUMIN 3.8 12/05/2020   CALCIUM 8.8 (L) 02/06/2021   ANIONGAP 6 02/06/2021   Lab Results  Component Value Date   CHOL 188 03/26/2021   Lab Results  Component Value Date   HDL 51 03/26/2021   Lab Results  Component Value Date   LDLCALC 115 (H) 03/26/2021   Lab Results  Component Value Date   TRIG 112 03/26/2021   Lab Results  Component Value Date   CHOLHDL 3.7 03/26/2021   Lab Results  Component Value Date   HGBA1C 7.9 (H) 02/04/2021      Assessment & Plan:   Problem List Items Addressed This Visit       Cardiovascular and Mediastinum   Essential hypertension (Chronic)    Pt blood pressure 140/96.  Advise pt to limit the intake of sodium.  Would consider to increase the medication dosage if the BP not controlled.           Respiratory   Asthma    Stable with medication.        Endocrine   Type 2 diabetes mellitus with hyperglycemia ([place]) - Primary    Patient lost 6 lb in 2  weeks. Advised pt to check the BS regularly, make a log and bring to next appointment.  Advised pt to monitor diet. Advised pt to eat variety of food including fruits, vegetables, whole grains, complex carbohydrates and proteins.        Relevant Orders   Microalbumin, urine (Completed)     Other   Epistaxis    Patient has nose bleed during the  Spring season. She has nasal polyps. Patient is using afrin nasal spray. Referral send to ENT.      Relevant Orders   Ambulatory referral to ENT     Meds ordered this encounter  Medications   blood glucose meter kit and supplies    Sig: Dispense based on patient and insurance preference. Use up to four times daily as directed. (FOR ICD-10 E10.9, E11.9).    Dispense:  1 each    Refill:  0    Order Specific Question:   Number of strips    Answer:   100    Order Specific Question:   Number of lancets    Answer:   100     Follow-up: No follow-ups on file.    Theresia Lo, NP

## 2021-04-03 LAB — MICROALBUMIN, URINE: Microalb, Ur: 0.3 mg/dL

## 2021-04-04 ENCOUNTER — Encounter: Payer: Self-pay | Admitting: Nurse Practitioner

## 2021-04-04 DIAGNOSIS — R040 Epistaxis: Secondary | ICD-10-CM | POA: Insufficient documentation

## 2021-04-04 NOTE — Assessment & Plan Note (Signed)
Patient has nose bleed during the  Spring season. ?She has nasal polyps. ?Patient is using afrin nasal spray. ?Referral send to ENT. ?

## 2021-04-04 NOTE — Assessment & Plan Note (Signed)
Pt blood pressure 140/96.  ?Advise pt to limit the intake of sodium.  ?Would consider to increase the medication dosage if the BP not controlled.  ? ? ?

## 2021-04-04 NOTE — Assessment & Plan Note (Signed)
Stable with medication.

## 2021-04-04 NOTE — Assessment & Plan Note (Signed)
Patient lost 6 lb in 2 weeks. ?Advised pt to check the BS regularly, make a log and bring to next appointment.  ?Advised pt to monitor diet. ?Advised pt to eat variety of food including fruits, vegetables, whole grains, complex carbohydrates and proteins.  ? ?

## 2021-04-22 IMAGING — CR DG CHEST 2V
1 series · 2 of 2 positions shown · non-contrast
Comparison: 10/17/2019

CLINICAL DATA: Cough and wheeze

EXAM:
CHEST - 2 VIEW

[Series 1: dg chest 2 view · 0.14mm/px · 2 of 2 slices shown]
[im 1/2]
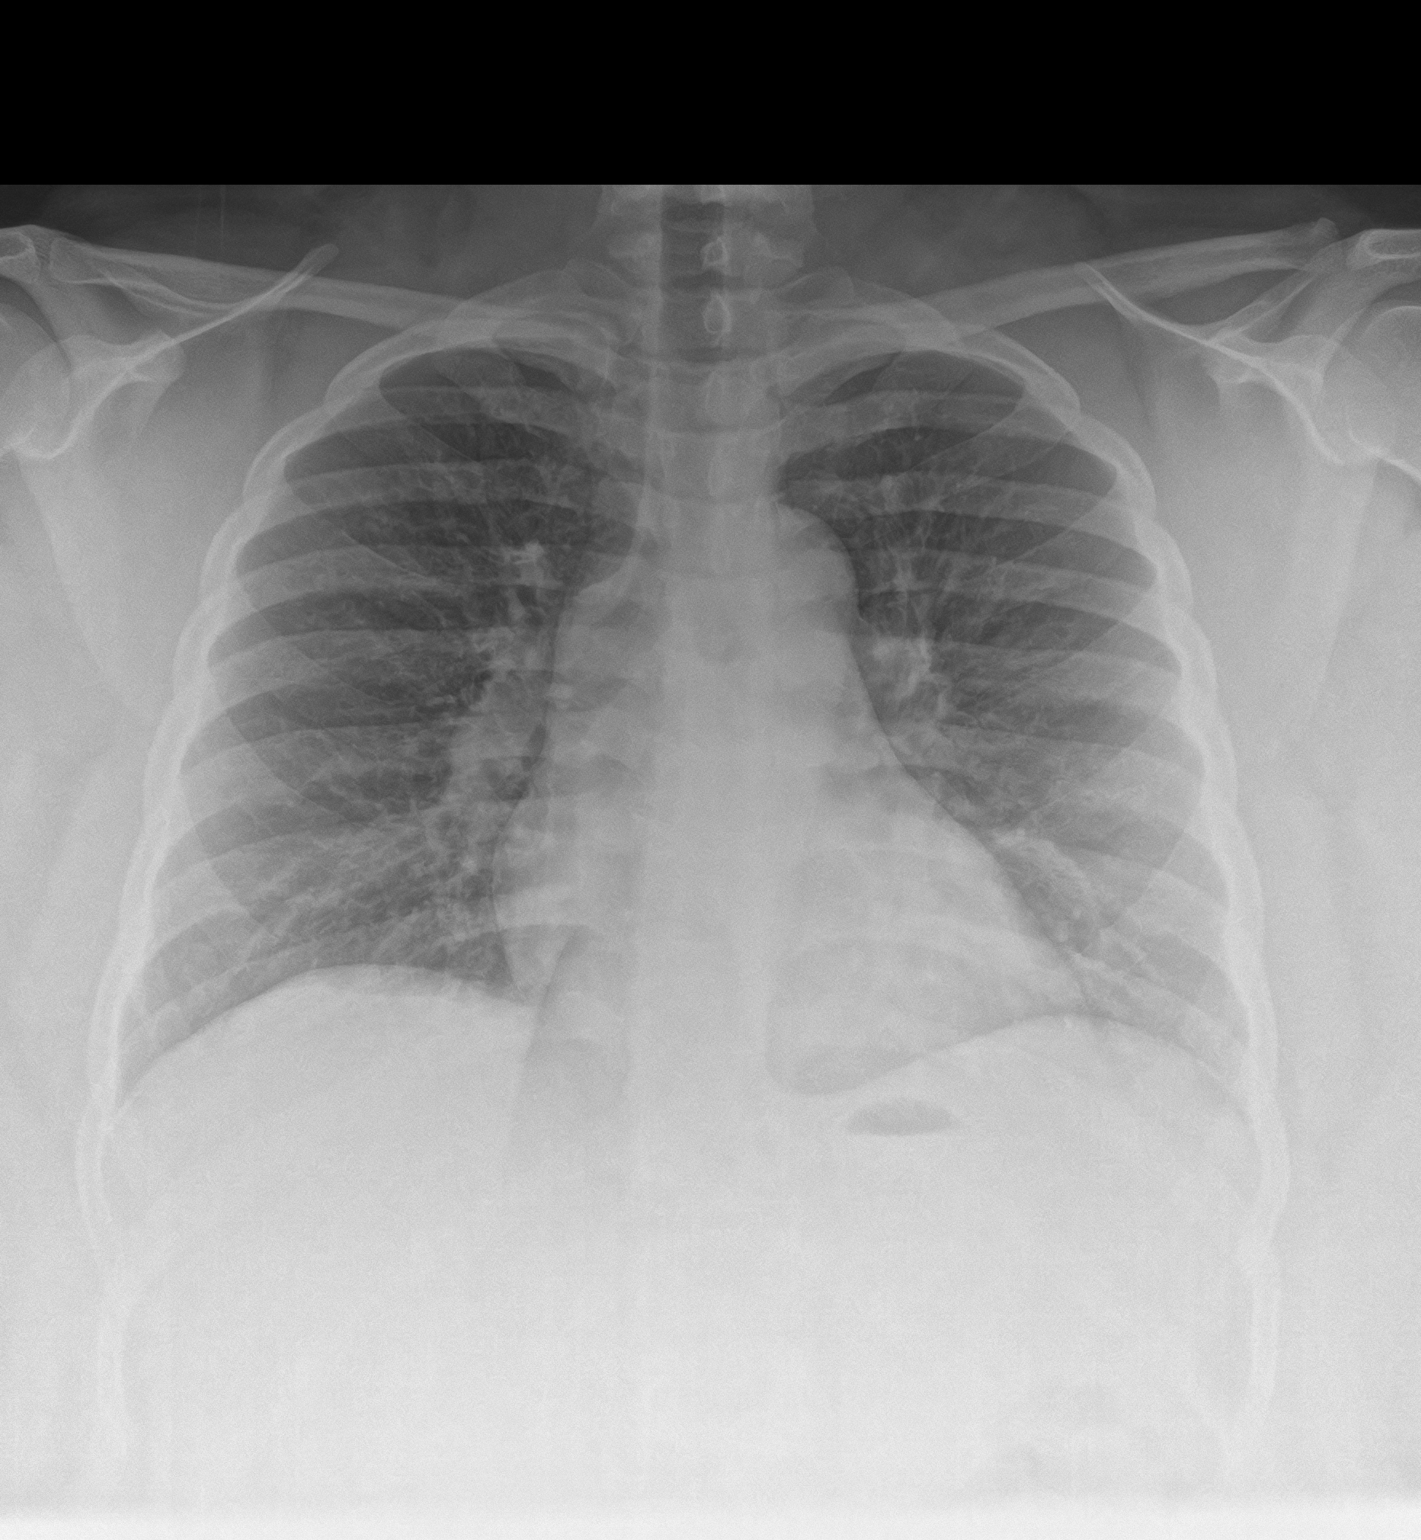
[im 2/2]
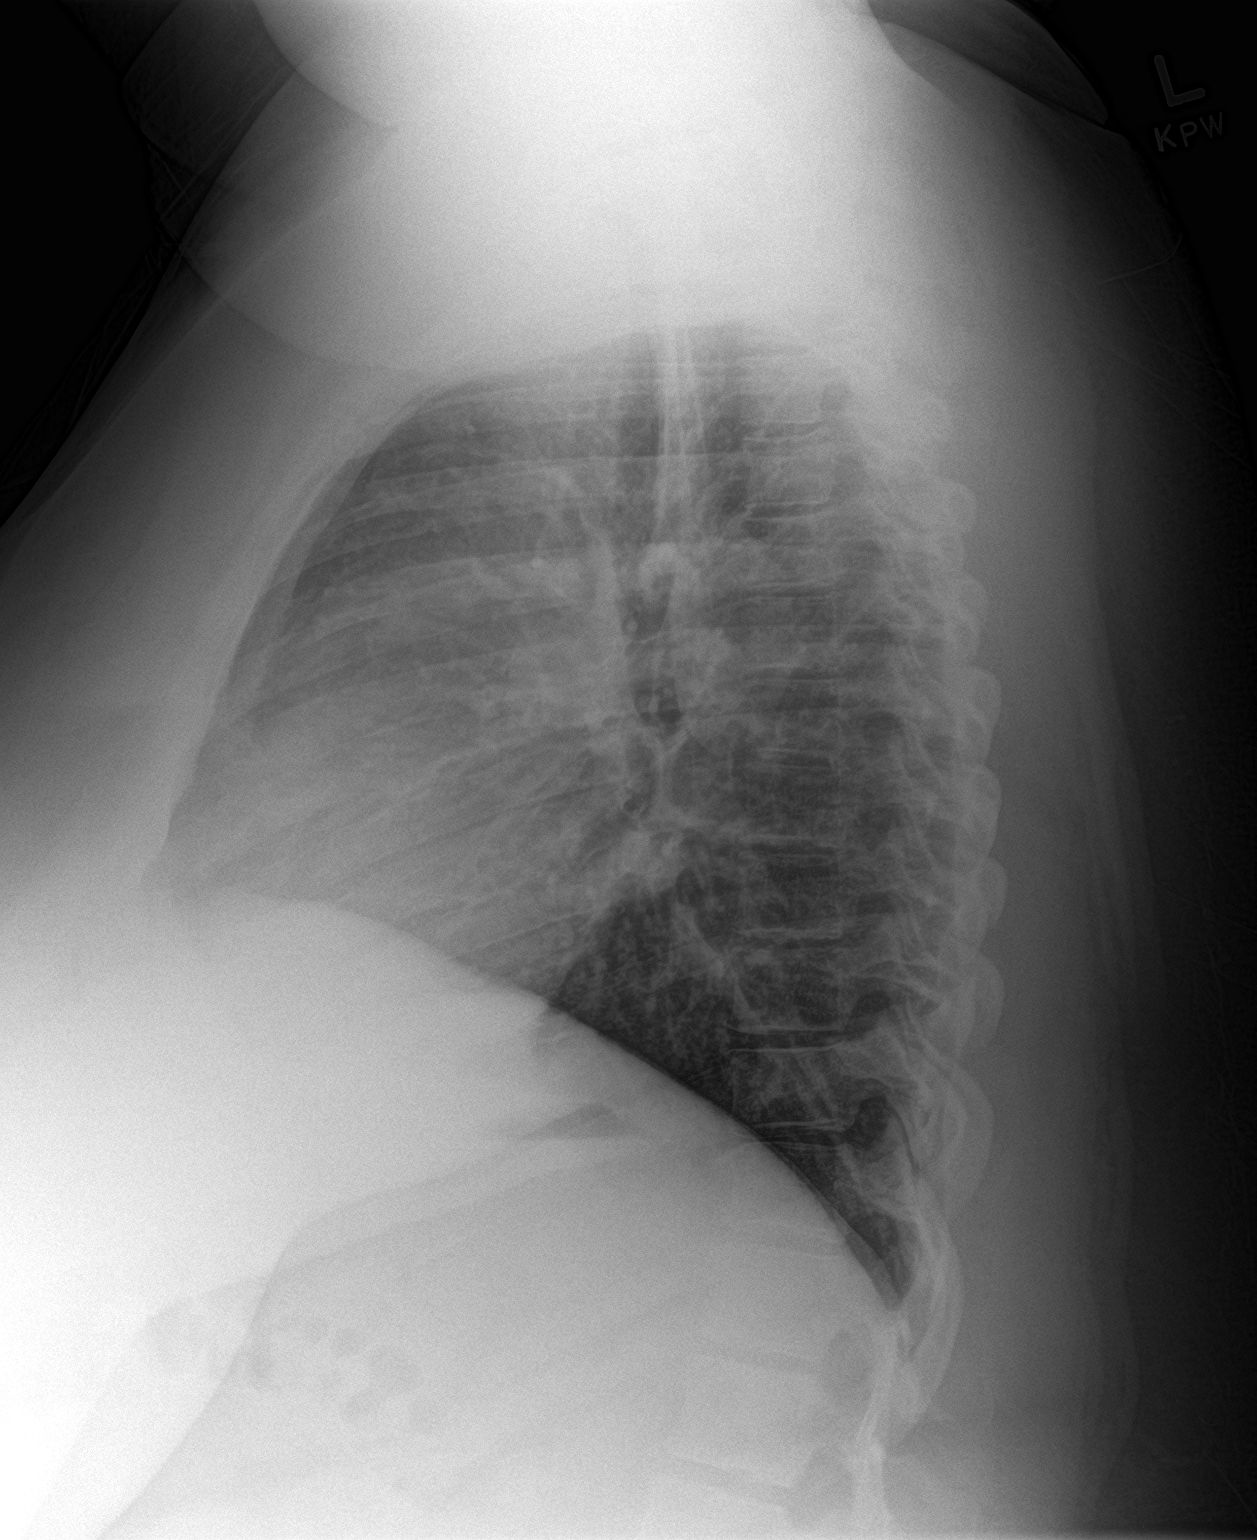

[2 of 2 positions shown; findings below may reference images not displayed]

FINDINGS: Normal heart size and stable mediastinal contours. Mild prominence
of markings which is stable. No clear bronchial cuffing. No acute
infiltrate or edema. No effusion or pneumothorax. No acute osseous
findings.
IMPRESSION: Stable exam.  No focal abnormality.

## 2021-04-29 ENCOUNTER — Ambulatory Visit: Payer: BLUE CROSS/BLUE SHIELD | Admitting: Nurse Practitioner

## 2021-04-30 ENCOUNTER — Ambulatory Visit: Payer: BLUE CROSS/BLUE SHIELD | Admitting: Nurse Practitioner

## 2021-06-11 ENCOUNTER — Other Ambulatory Visit: Payer: Self-pay | Admitting: *Deleted

## 2021-06-11 MED ORDER — HALOBETASOL PROPIONATE 0.05 % EX CREA: 1.0000 "application " | TOPICAL_CREAM | Freq: Two times a day (BID) | CUTANEOUS | 4 refills | Status: DC

## 2021-07-05 LAB — HM DIABETES EYE EXAM

## 2021-07-06 ENCOUNTER — Encounter: Payer: Self-pay | Admitting: *Deleted

## 2021-08-12 ENCOUNTER — Ambulatory Visit
Admission: EM | Admit: 2021-08-12 | Discharge: 2021-08-12 | Disposition: A | Discharge status: Home / Self Care | Payer: BC Managed Care – PPO | Attending: Emergency Medicine | Admitting: Emergency Medicine

## 2021-08-12 DIAGNOSIS — I10 Essential (primary) hypertension: Secondary | ICD-10-CM | POA: Diagnosis not present

## 2021-08-12 DIAGNOSIS — H9202 Otalgia, left ear: Secondary | ICD-10-CM

## 2021-08-12 DIAGNOSIS — R238 Other skin changes: Secondary | ICD-10-CM

## 2021-08-12 HISTORY — DX: Essential (primary) hypertension: I10

## 2021-08-12 MED ORDER — MUPIROCIN 2 % EX OINT: 1.0000 | TOPICAL_OINTMENT | Freq: Two times a day (BID) | CUTANEOUS | 0 refills | Status: AC

## 2021-08-12 NOTE — Discharge Instructions (Addendum)
Use the mupirocin ointment as directed.  Follow up with your primary care provider if your symptoms are not improving.    Your blood pressure is elevated today at 156/89; repeat 150/114.  Please have this rechecked by your primary care provider in 1-2 weeks.

## 2021-08-12 NOTE — ED Triage Notes (Signed)
Pt presents with L ear pain x 2 days that spread to TMJ with any movement.  Unsure if she grinds her teeth. No known fever. Ear pain much worse when she touches it.

## 2021-08-12 NOTE — ED Provider Notes (Signed)
[name]    CSN: [id number] Arrival date & time: 08/12/21  0807      History   Chief Complaint Chief Complaint  Patient presents with   Otalgia    HPI Delene Schoenberg is a 31 y.o. female.  Patient presents with pain in her left ear x2 days.  The pain is worse with movement of her ear.  No ear drainage, fever, chills, rash, sore throat, cough, shortness of breath, vomiting, diarrhea, or other symptoms.  No treatments at home.  Her medical history includes asthma, diabetes, hypertension, morbid obesity.  The history is provided by the patient and medical records.    Past Medical History:  Diagnosis Date   Asthma    Diabetes mellitus without complication ([place])    Eczema    Hypertension     Patient Active Problem List   Diagnosis Date Noted   Epistaxis 04/04/2021   Establishing care with new doctor, encounter for 03/19/2021   Essential hypertension 02/04/2021   GERD (gastroesophageal reflux disease) 02/04/2021   Pulmonary vascular congestion 02/04/2021   Asthma 02/03/2021   Diabetes mellitus without complication ([place])    Acute respiratory failure with hypoxia ([place]) 12/15/2019   Type 2 diabetes mellitus with hyperglycemia ([place]) 10/26/2018   Morbid obesity ([place]) 10/26/2018   Depression 10/26/2018   Asthma exacerbation 10/26/2018   Elevated BP without diagnosis of hypertension 10/26/2018   Posterior subcapsular polar cataract, 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      Home Medications    Prior to Admission medications   Medication Sig Start Date End Date Taking? Authorizing Provider  albuterol (PROVENTIL) (2.5 MG/3ML) 0.083% nebulizer solution Take 3 mLs (2.5 mg total) by nebulization every 6 (six) hours as needed for wheezing or shortness of breath. 03/23/21  Yes Masoud, Viann Shove, MD  albuterol (VENTOLIN HFA) 108 (90 Base) MCG/ACT  inhaler Inhale 2 puffs into the lungs every 4 (four) hours as needed for shortness of breath or wheezing. 03/25/21  Yes Theresia Lo, NP  blood glucose meter kit and supplies KIT Dispense based on patient and insurance preference. Use up to four times daily as directed. 02/03/21  Yes Covington, Sarah M, PA-C  blood glucose meter kit and supplies Dispense based on patient and insurance preference. Use up to four times daily as directed. (FOR ICD-10 E10.9, E11.9). 04/02/21  Yes Theresia Lo, NP  EPINEPHrine 0.3 mg/0.3 mL IJ SOAJ injection Inject 0.3 mg into the muscle as needed for anaphylaxis.   Yes [provider]  Fiber Adult Gummies 2 g CHEW Chew 4 tablets by mouth daily.   Yes [provider]  halobetasol (ULTRAVATE) 0.05 % cream Apply 1 application. topically 2 (two) times daily. Apply topically twice daily after showers 06/11/21  Yes Kaur, Thurston Hole, NP  losartan-hydrochlorothiazide (HYZAAR) 100-12.5 MG tablet Take 1 tablet by mouth daily. 03/19/21  Yes Theresia Lo, NP  metFORMIN (GLUCOPHAGE) 500 MG tablet Take 1 tablet (500 mg total) by mouth 2 (two) times daily with a meal. 03/19/21  Yes Theresia Lo, NP  mometasone-formoterol (DULERA) 100-5 MCG/ACT AERO Inhale 2 puffs into the lungs 2 (two) times daily. 03/19/21  Yes Theresia Lo, NP  montelukast (SINGULAIR) 10 MG tablet Take 10 mg by mouth daily.   Yes [provider]  mupirocin ointment (BACTROBAN) 2 % Apply 1 Application topically 2 (two) times daily. 08/12/21  Yes Sharion Balloon, NP  DM-APAP-CPM (CORICIDIN HBP PO) Take 2 tablets by mouth every other day as needed. For cold, flu and chest congestion.    [provider]  doxycycline (VIBRAMYCIN) 100 MG capsule Take 1 capsule (100 mg total) by mouth 2 (two) times daily. 02/28/21   Scot Jun, FNP    Family History Family History  Problem Relation Age of Onset   Eczema Mother    Diabetes Father    Psoriasis Father    Eczema  Father    Sleep apnea Father    Asthma Sister    Eczema Sister    Eczema Sister     Social History Social History   Tobacco Use   Smoking status: Former    Packs/day: 0.50    Types: Cigarettes    Quit date: 04/16/2019    Years since quitting: 2.3   Smokeless tobacco: Never  Vaping Use   Vaping Use: Never used  Substance Use Topics   Alcohol use: Yes    Alcohol/week: 4.0 standard drinks of alcohol    Types: 4 Shots of liquor per week    Comment: socially   Drug use: Not Currently    Types: Marijuana     Allergies   Bee venom, Peanut oil, and Shellfish allergy   Review of Systems Review of Systems  Constitutional:  Negative for chills and fever.  HENT:  Positive for ear pain. Negative for ear discharge and sore throat.   Respiratory:  Negative for cough and shortness of breath.   Cardiovascular:  Negative for chest pain and palpitations.  Gastrointestinal:  Negative for diarrhea and vomiting.  Skin:  Negative for color change and rash.  All other systems reviewed and are negative.    Physical Exam Triage Vital Signs ED Triage Vitals  Enc Vitals Group     BP      Pulse      Resp      Temp      Temp src      SpO2      Weight      Height      Head Circumference      Peak Flow      Pain Score      Pain Loc      Pain Edu?      Excl. in [place]?    No data found.  Updated Vital Signs BP (!) 150/114 (BP Location: Left Wrist)   Pulse (!) 106   Temp 98.2 F (36.8 C)   Resp 18   LMP  (Approximate)   SpO2 96%   Visual Acuity Right Eye Distance:   Left Eye Distance:   Bilateral Distance:    Right Eye Near:   Left Eye Near:    Bilateral Near:     Physical Exam Vitals and nursing note reviewed.  Constitutional:      General: She is not in acute distress.    Appearance: She is well-developed. She is obese. She is not ill-appearing.  HENT:     Right Ear: Tympanic membrane and ear canal normal.     Left Ear: Tympanic membrane and ear canal normal.      Ears:      Nose: Nose normal.     Mouth/Throat:     Mouth: Mucous membranes are moist.     Pharynx: Oropharynx is clear.  Cardiovascular:     Rate and Rhythm: Normal rate and regular rhythm.     Heart sounds:  Normal heart sounds.  Pulmonary:     Effort: Pulmonary effort is normal. No respiratory distress.     Breath sounds: Normal breath sounds.  Musculoskeletal:     Cervical back: Neck supple.  Skin:    General: Skin is warm and dry.  Neurological:     Mental Status: She is alert.  Psychiatric:        Mood and Affect: Mood normal.        Behavior: Behavior normal.      UC Treatments / Results  Labs (all labs ordered are listed, but only abnormal results are displayed) Labs Reviewed - No data to display  EKG   Radiology No results found.  Procedures Procedures (including critical care time)  Medications Ordered in UC Medications - No data to display  Initial Impression / Assessment and Plan / UC Course  I have reviewed the triage vital signs and the nursing notes.  Pertinent labs & imaging results that were available during my care of the patient were reviewed by me and considered in my medical decision making (see chart for details).    Elevated blood pressure reading with hypertension, left ear pain, papule on left tragus.  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  She will take her blood pressure medicine when she gets home.  Discussed that her blood pressure is high today and needs to be rechecked by her PCP in 1 to 2 weeks.  Education provided on managing hypertension.  Treating papule on left tragus with mupirocin ointment and warm compresses.  Discussed Tylenol or ibuprofen as needed.  Instructed patient to follow up with her PCP if her symptoms are not improving.  She agrees to plan of care.    Final Clinical Impressions(s) / UC Diagnoses   Final diagnoses:  Elevated  blood pressure reading in office with diagnosis of hypertension  Left ear pain  Papule     Discharge Instructions      Use the mupirocin ointment as directed.  Follow up with your primary care provider if your symptoms are not improving.    Your blood pressure is elevated today at 156/89; repeat 150/114.  Please have this rechecked by your primary care provider in 1-2 weeks.          ED Prescriptions     Medication Sig Dispense Auth. Provider   mupirocin ointment (BACTROBAN) 2 % Apply 1 Application topically 2 (two) times daily. 22 g Sharion Balloon, NP      PDMP not reviewed this encounter.   Sharion Balloon, NP 08/12/21 [phone]

## 2021-11-09 ENCOUNTER — Other Ambulatory Visit: Payer: Self-pay | Admitting: Nurse Practitioner

## 2021-11-09 DIAGNOSIS — J45909 Unspecified asthma, uncomplicated: Secondary | ICD-10-CM

## 2021-11-10 ENCOUNTER — Ambulatory Visit
Admission: EM | Admit: 2021-11-10 | Discharge: 2021-11-10 | Disposition: A | Discharge status: Home / Self Care | Payer: BC Managed Care – PPO

## 2021-11-10 DIAGNOSIS — J069 Acute upper respiratory infection, unspecified: Secondary | ICD-10-CM | POA: Diagnosis not present

## 2021-11-10 DIAGNOSIS — J45901 Unspecified asthma with (acute) exacerbation: Secondary | ICD-10-CM

## 2021-11-10 MED ORDER — AZITHROMYCIN 250 MG PO TABS: 250.0000 mg | ORAL_TABLET | Freq: Every day | ORAL | 0 refills | Status: DC

## 2021-11-10 MED ORDER — PREDNISONE 50 MG PO TABS: 50.0000 mg | ORAL_TABLET | Freq: Every day | ORAL | 0 refills | Status: AC

## 2021-11-10 NOTE — Discharge Instructions (Addendum)
Please follow-up ASAP with your primary care provider regarding your type 2 diabetes and asthma.  I have prescribed a steroid today to treat your exacerbation of asthma.  This medication is likely to cause your blood sugars to rise.  I have also prescribed an antibiotic to treat your sinus infection given your multiple comorbidities including diabetes and asthma.

## 2021-11-10 NOTE — ED Triage Notes (Addendum)
Pt. Presents to UC w/ c/o nasal congestin and drainage that is yellow. Pt. Reports having cold symptoms that started last week. Pt. Also reports an increase in using her breathing treatments since her symptoms started.

## 2021-11-10 NOTE — ED Provider Notes (Signed)
[name]    CSN: [id number] Arrival date & time: 11/10/21  0845      History   Chief Complaint Chief Complaint  Patient presents with   Nasal Congestion   Cough    HPI Kelsey Mcclure is a 31 y.o. female.    Cough   Patient presents to UC with complaint of sinus pain/pressure times 1+ weeks along with cough, sore throat, asthma exacerbation x3 days.  Patient states she is treated for asthma and uses albuterol with ipratropium nebulizer solution.  She also uses albuterol inhaler as well as twice daily Dulera (mometasone).  She states she has been experiencing wheezing recently.  Also reports using prednisone when she has acute exacerbation.  Patient is treated for hypertension as well as diabetes mellitus type 2.  Most recent lab is from 02/04/2021 with A1c equals 7.9.   Past Medical History:  Diagnosis Date   Asthma    Diabetes mellitus without complication ([place])    Eczema    Hypertension     Patient Active Problem List   Diagnosis Date Noted   Epistaxis 04/04/2021   Establishing care with new doctor, encounter for 03/19/2021   Essential hypertension 02/04/2021   GERD (gastroesophageal reflux disease) 02/04/2021   Pulmonary vascular congestion 02/04/2021   Asthma 02/03/2021   Diabetes mellitus without complication ([place])    Acute respiratory failure with hypoxia ([place]) 12/15/2019   Type 2 diabetes mellitus with hyperglycemia ([place]) 10/26/2018   Morbid obesity ([place]) 10/26/2018   Depression 10/26/2018   Asthma exacerbation 10/26/2018   Elevated BP without diagnosis of hypertension 10/26/2018   Posterior subcapsular polar cataract, 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      Home Medications    Prior to Admission medications   Medication Sig Start Date End Date Taking? Authorizing Provider  metFORMIN (GLUCOPHAGE) 500 MG  tablet Take by mouth. 03/19/21  Yes [provider]  albuterol (PROVENTIL) (2.5 MG/3ML) 0.083% nebulizer solution Take 3 mLs (2.5 mg total) by nebulization every 6 (six) hours as needed for wheezing or shortness of breath. 03/23/21   Cletis Athens, MD  albuterol (VENTOLIN HFA) 108 (90 Base) MCG/ACT inhaler Inhale 2 puffs into the lungs every 4 (four) hours as needed for shortness of breath or wheezing. 03/25/21   Theresia Lo, NP  blood glucose meter kit and supplies KIT Dispense based on patient and insurance preference. Use up to four times daily as directed. 02/03/21   Hughie Closs, PA-C  blood glucose meter kit and supplies Dispense based on patient and insurance preference. Use up to four times daily as directed. (FOR ICD-10 E10.9, E11.9). 04/02/21   Theresia Lo, NP  DM-APAP-CPM (CORICIDIN HBP PO) Take 2 tablets by mouth every other day as needed. For cold, flu and chest congestion.    [provider]  doxycycline (VIBRAMYCIN) 100 MG capsule Take 1 capsule (100 mg total) by mouth 2 (two) times daily. 02/28/21   Scot Jun, FNP  DULERA 100-5 MCG/ACT AERO Inhale 2 puffs by mouth twice daily 11/09/21   Cletis Athens, MD  EPINEPHrine 0.3 mg/0.3 mL IJ SOAJ injection Inject 0.3 mg into the muscle as needed for anaphylaxis.    [provider]  Fiber Adult Gummies 2 g CHEW Chew 4 tablets by mouth daily.    [provider]  halobetasol (ULTRAVATE) 0.05 % cream  Apply 1 application. topically 2 (two) times daily. Apply topically twice daily after showers 06/11/21   Theresia Lo, NP  ipratropium (ATROVENT) 0.02 % nebulizer solution  06/09/21   [provider]  irbesartan (AVAPRO) 75 MG tablet  06/09/21   [provider]  losartan-hydrochlorothiazide (HYZAAR) 100-12.5 MG tablet Take 1 tablet by mouth daily. 03/19/21   Theresia Lo, NP  metFORMIN (GLUCOPHAGE) 500 MG tablet Take 1 tablet (500 mg total) by mouth 2 (two) times daily with  a meal. 03/19/21   Theresia Lo, NP  montelukast (SINGULAIR) 10 MG tablet Take 10 mg by mouth daily.    [provider]  mupirocin ointment (BACTROBAN) 2 % Apply 1 Application topically 2 (two) times daily. 08/12/21   Sharion Balloon, NP  predniSONE (DELTASONE) 10 MG tablet  06/09/21   [provider]    Family History Family History  Problem Relation Age of Onset   Eczema Mother    Diabetes Father    Psoriasis Father    Eczema Father    Sleep apnea Father    Asthma Sister    Eczema Sister    Eczema Sister     Social History Social History   Tobacco Use   Smoking status: Former    Packs/day: 0.50    Types: Cigarettes    Quit date: 04/16/2019    Years since quitting: 2.5   Smokeless tobacco: Never  Vaping Use   Vaping Use: Never used  Substance Use Topics   Alcohol use: Yes    Alcohol/week: 4.0 standard drinks of alcohol    Types: 4 Shots of liquor per week    Comment: socially   Drug use: Not Currently    Types: Marijuana     Allergies   Bee venom, Peanut oil, and Shellfish allergy   Review of Systems Review of Systems  Respiratory:  Positive for cough.      Physical Exam Triage Vital Signs ED Triage Vitals  Enc Vitals Group     BP 11/10/21 0855 (!) 150/69     Pulse Rate 11/10/21 0855 90     Resp 11/10/21 0855 16     Temp 11/10/21 0855 97.7 F (36.5 C)     Temp src --      SpO2 11/10/21 0855 95 %     Weight --      Height --      Head Circumference --      Peak Flow --      Pain Score 11/10/21 0856 0     Pain Loc --      Pain Edu? --      Excl. in [place]? --    No data found.  Updated Vital Signs BP (!) 150/69 Comment: Pt. reports not taking her B/P medication this morning  Pulse 90   Temp 97.7 F (36.5 C)   Resp 16   LMP  (LMP Unknown)   SpO2 95%   Visual Acuity Right Eye Distance:   Left Eye Distance:   Bilateral Distance:    Right Eye Near:   Left Eye Near:    Bilateral Near:     Physical Exam Vitals reviewed.   Constitutional:      Appearance: Normal appearance.  HENT:     Mouth/Throat:     Pharynx: No oropharyngeal exudate or posterior oropharyngeal erythema.  Cardiovascular:     Rate and Rhythm: Normal rate and regular rhythm.     Pulses: Normal pulses.  Heart sounds: Normal heart sounds.  Pulmonary:     Breath sounds: Decreased air movement present. Examination of the right-upper field reveals wheezing. Examination of the left-upper field reveals wheezing. Examination of the right-middle field reveals wheezing. Examination of the left-middle field reveals wheezing. Examination of the right-lower field reveals wheezing. Examination of the left-lower field reveals wheezing. Wheezing present.  Skin:    General: Skin is warm and dry.  Neurological:     General: No focal deficit present.     Mental Status: She is alert and oriented to person, place, and time.  Psychiatric:        Mood and Affect: Mood normal.        Behavior: Behavior normal.      UC Treatments / Results  Labs (all labs ordered are listed, but only abnormal results are displayed) Labs Reviewed - No data to display  EKG   Radiology No results found.  Procedures Procedures (including critical care time)  Medications Ordered in UC Medications - No data to display  Initial Impression / Assessment and Plan / UC Course  I have reviewed the triage vital signs and the nursing notes.  Pertinent labs & imaging results that were available during my care of the patient were reviewed by me and considered in my medical decision making (see chart for details).   Audible wheezing is heard without stethoscope and in all lung lobes.  Poor air movement.  Minor pharyngeal erythema without peritonsillar exudates.  Will treat for acute exacerbation of asthma with prednisone x5 days.  Patient is warned this is likely to cause hyperglycemia and she needs ASAP follow-up with her PCP for DM2 as well as asthma.  Also prescribing  azithromycin given multiple comorbidities and risk for poor outcome if there is a bacterial etiology for her 1+ week symptoms of sinusitis.   Final Clinical Impressions(s) / UC Diagnoses   Final diagnoses:  None   Discharge Instructions   None    ED Prescriptions   None    PDMP not reviewed this encounter.   Rose Phi, [place] 11/10/21 [phone]

## 2021-12-02 ENCOUNTER — Emergency Department
Admission: EM | Admit: 2021-12-02 | Discharge: 2021-12-02 | Disposition: A | Discharge status: Home / Self Care | Payer: Self-pay | Attending: Emergency Medicine | Admitting: Emergency Medicine

## 2021-12-02 ENCOUNTER — Emergency Department: Payer: Self-pay

## 2021-12-02 ENCOUNTER — Other Ambulatory Visit: Payer: Self-pay

## 2021-12-02 ENCOUNTER — Encounter: Payer: Self-pay | Admitting: Emergency Medicine

## 2021-12-02 DIAGNOSIS — J069 Acute upper respiratory infection, unspecified: Secondary | ICD-10-CM | POA: Insufficient documentation

## 2021-12-02 DIAGNOSIS — J45909 Unspecified asthma, uncomplicated: Secondary | ICD-10-CM | POA: Insufficient documentation

## 2021-12-02 DIAGNOSIS — Z20822 Contact with and (suspected) exposure to covid-19: Secondary | ICD-10-CM | POA: Insufficient documentation

## 2021-12-02 DIAGNOSIS — I10 Essential (primary) hypertension: Secondary | ICD-10-CM | POA: Insufficient documentation

## 2021-12-02 DIAGNOSIS — E119 Type 2 diabetes mellitus without complications: Secondary | ICD-10-CM | POA: Insufficient documentation

## 2021-12-02 LAB — RESP PANEL BY RT-PCR (FLU A&B, COVID) ARPGX2
Influenza A by PCR: NEGATIVE
Influenza B by PCR: NEGATIVE
SARS Coronavirus 2 by RT PCR: NEGATIVE

## 2021-12-02 MED ORDER — PREDNISONE 20 MG PO TABS
50.0000 mg | ORAL_TABLET | Freq: Once | ORAL | Status: AC
  Administered 2021-12-02: 50 mg via ORAL
  Filled 2021-12-02: qty 3

## 2021-12-02 MED ORDER — IPRATROPIUM-ALBUTEROL 0.5-2.5 (3) MG/3ML IN SOLN
3.0000 mL | Freq: Once | RESPIRATORY_TRACT | Status: AC
  Administered 2021-12-02: 3 mL via RESPIRATORY_TRACT
  Filled 2021-12-02: qty 3

## 2021-12-02 MED ORDER — PREDNISONE 50 MG PO TABS: ORAL_TABLET | ORAL | 0 refills | Status: DC

## 2021-12-02 NOTE — ED Provider Notes (Signed)
[facility] Provider Note    Event Date/Time   First MD Initiated Contact with Patient 12/02/21 1051     (approximate)   History   Cough   HPI  Kelsey Mcclure is a 31 y.o. female with a past medical history of pulmonary vascular congestion, asthma, diabetes, obesity who presents today for evaluation shortness of breath cough, wheezing, and congestion.  Patient reports that this is typical of her asthma exacerbations and normally happens when the weather changes and recently has gotten cold.  She reports that she has had a dry cough and wheezing.  No chest pain.  She has not had a fever.  No known sick contacts.  Has been using her inhalers at home without improvement of her symptoms.  Patient Active Problem List   Diagnosis Date Noted   Epistaxis 04/04/2021   Establishing care with new doctor, encounter for 03/19/2021   Essential hypertension 02/04/2021   GERD (gastroesophageal reflux disease) 02/04/2021   Pulmonary vascular congestion 02/04/2021   Asthma 02/03/2021   Diabetes mellitus without complication ([facility])    Acute respiratory failure with hypoxia ([facility]) 12/15/2019   Type 2 diabetes mellitus with hyperglycemia ([facility]) 10/26/2018   Morbid obesity ([facility]) 10/26/2018   Depression 10/26/2018   Asthma exacerbation 10/26/2018   Elevated BP without diagnosis of hypertension 10/26/2018   Posterior subcapsular polar cataract, 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 --      Excl. in [place]? --      Most recent vital signs: Vitals:   12/02/21 1021 12/02/21 1328  BP: (!) 144/128 (!) 138/100  Pulse: 91 88  Resp: (!) 24 20  Temp: 98.6 F (37 C)   SpO2: 99% 99%    Physical Exam Vitals and nursing note reviewed.  Constitutional:      General: Awake and alert. No acute distress.    Appearance: Normal appearance. The patient is normal weight.  HENT:     Head: Normocephalic and atraumatic.     Mouth: Mucous membranes are moist.  Eyes:     General: PERRL. Normal EOMs        Right eye: No discharge.        Left eye: No discharge.     Conjunctiva/sclera: Conjunctivae normal.  Cardiovascular:     Rate and Rhythm: Normal rate and regular rhythm.     Pulses: Normal pulses.     Heart sounds: Normal heart sounds Pulmonary:     Effort: Pulmonary effort is normal. No respiratory distress.     Breath sounds: Expiratory wheezes bilaterally.  Able to speak easily in complete sentences.  No accessory muscle use. Abdominal:     Abdomen is soft. There is no abdominal  tenderness. No rebound or guarding. No distention. Musculoskeletal:        General: No swelling. Normal range of motion.     Cervical back: Normal range of motion and neck supple.  No lower extremity edema. Skin:    General: Skin is warm and dry.     Capillary Refill: Capillary refill takes less than 2 seconds.     Findings: No rash.  Neurological:     Mental Status: The patient is awake and alert.      ED Results / Procedures / Treatments   Labs (all labs ordered are listed, but only abnormal results are displayed) Labs Reviewed  RESP PANEL BY RT-PCR (FLU A&B, COVID) [id number]     EKG     RADIOLOGY I independently reviewed and interpreted imaging and agree with radiologists findings.     PROCEDURES:  Critical Care performed:   Procedures   MEDICATIONS ORDERED IN ED: Medications  ipratropium-albuterol (DUONEB) 0.5-2.5 (3) MG/3ML nebulizer solution 3 mL (3 mLs Nebulization Given 12/02/21 1107)   ipratropium-albuterol (DUONEB) 0.5-2.5 (3) MG/3ML nebulizer solution 3 mL (3 mLs Nebulization Given 12/02/21 1254)  predniSONE (DELTASONE) tablet 50 mg (50 mg Oral Given 12/02/21 1254)     IMPRESSION / MDM / ASSESSMENT AND PLAN / ED COURSE  I reviewed the triage vital signs and the nursing notes.   Differential diagnosis includes, but is not limited to, asthma exacerbation, bronchitis, pneumonia.  No chest pain to suggest acute coronary syndrome.  Patient is awake and alert, hemodynamically stable and afebrile.  She has normal oxygen saturation on room air but has slightly increased respiratory rate.  She has audible expiratory wheezes and all lung fields.  She was treated with 2 DuoNebs and prednisone with significant improvement of her symptoms.  COVID/flu/RSV is negative and chest x-ray does not demonstrate any acute cardiopulmonary abnormality.  At the time of discharge, wheezes had resolved, respirations were normal, she had a normal oxygen saturation of 99% on room air.  She was given 4 more days to complete the prednisone burst.  We discussed return precautions and the importance of close outpatient follow-up.  Patient or stands and agrees with plan.  She was discharged in stable condition.   Patient's presentation is most consistent with acute complicated illness / injury requiring diagnostic workup.   Clinical Course as of 12/02/21 1350  Thu Dec 02, 2021  1318 Wheezes have resolved, patient reports that she feels back to normal [JP]    Clinical Course User Index [JP] Mory Herrman, Herb Grays, PA-C     FINAL CLINICAL IMPRESSION(S) / ED DIAGNOSES   Final diagnoses:  Viral URI with cough     Rx / DC Orders   ED Discharge Orders          Ordered    predniSONE (DELTASONE) 50 MG tablet        12/02/21 1319             Note:  This document was prepared using Dragon voice recognition software and may include unintentional dictation errors.   Keturah Shavers 12/02/21  1350    Minna Antis, MD 12/02/21 1355

## 2021-12-02 NOTE — ED Notes (Signed)
See triage note  Presents with cough and congestion  States sxs' started about 4 weeks ago  Has been treated for same  Afebrile on arrival

## 2021-12-02 NOTE — ED Triage Notes (Signed)
Pt states for the last 2 weeks with the weather change she has had a cough, congestion and some wheezing. Pt denies pain. Pt was seen 4 weeks ago at urgent care for similar, and was given medications that helped. Audible wheezes heard in triage room. Pt states history of asthma. Pt states last inhalor use was this morning.

## 2021-12-02 NOTE — Discharge Instructions (Addendum)
Take the medication as prescribed, starting tomorrow since your first dose was given in the emergency department.  Please return for any new, worsening, or change in symptoms or other concerns.  It was a pleasure caring for you today.

## 2021-12-23 ENCOUNTER — Ambulatory Visit: Payer: BC Managed Care – PPO | Admitting: Nurse Practitioner

## 2022-04-08 ENCOUNTER — Other Ambulatory Visit: Payer: Self-pay | Admitting: Nurse Practitioner

## 2022-04-08 DIAGNOSIS — J4541 Moderate persistent asthma with (acute) exacerbation: Secondary | ICD-10-CM

## 2022-04-29 ENCOUNTER — Encounter: Payer: Self-pay | Admitting: Emergency Medicine

## 2022-04-29 ENCOUNTER — Emergency Department: Payer: Medicaid Other

## 2022-04-29 DIAGNOSIS — J45901 Unspecified asthma with (acute) exacerbation: Secondary | ICD-10-CM | POA: Insufficient documentation

## 2022-04-29 DIAGNOSIS — E119 Type 2 diabetes mellitus without complications: Secondary | ICD-10-CM | POA: Insufficient documentation

## 2022-04-29 DIAGNOSIS — Z79899 Other long term (current) drug therapy: Secondary | ICD-10-CM | POA: Insufficient documentation

## 2022-04-29 DIAGNOSIS — I10 Essential (primary) hypertension: Secondary | ICD-10-CM | POA: Insufficient documentation

## 2022-04-29 DIAGNOSIS — Z7984 Long term (current) use of oral hypoglycemic drugs: Secondary | ICD-10-CM | POA: Insufficient documentation

## 2022-04-29 LAB — CBC
HCT: 40.4 % (ref 36.0–46.0)
Hemoglobin: 12.2 g/dL (ref 12.0–15.0)
MCH: 24.8 pg — ABNORMAL LOW (ref 26.0–34.0)
MCHC: 30.2 g/dL (ref 30.0–36.0)
MCV: 82.3 fL (ref 80.0–100.0)
Platelets: 324 10*3/uL (ref 150–400)
RBC: 4.91 MIL/uL (ref 3.87–5.11)
RDW: 14.7 % (ref 11.5–15.5)
WBC: 11 10*3/uL — ABNORMAL HIGH (ref 4.0–10.5)
nRBC: 0 % (ref 0.0–0.2)

## 2022-04-29 NOTE — ED Triage Notes (Signed)
Pt presents ambulatory to triage via POV with complaints of SOB - hx of asthma notes it started ~1 hour ago when she woke up. Pt notes being outside a lot today and her asthma is often affected by pollen. No dyspnea at this time - no audible wheezing. A&Ox4 at this time. Denies CP, fever, cough, N/V/D.

## 2022-04-30 ENCOUNTER — Emergency Department
Admission: EM | Admit: 2022-04-30 | Discharge: 2022-04-30 | Disposition: A | Discharge status: Home / Self Care | Payer: Self-pay | Attending: Emergency Medicine | Admitting: Emergency Medicine

## 2022-04-30 DIAGNOSIS — J45901 Unspecified asthma with (acute) exacerbation: Secondary | ICD-10-CM

## 2022-04-30 LAB — BASIC METABOLIC PANEL
Anion gap: 11 (ref 5–15)
BUN: 14 mg/dL (ref 6–20)
CO2: 28 mmol/L (ref 22–32)
Calcium: 9.2 mg/dL (ref 8.9–10.3)
Chloride: 97 mmol/L — ABNORMAL LOW (ref 98–111)
Creatinine, Ser: 0.7 mg/dL (ref 0.44–1.00)
GFR, Estimated: >60 mL/min (ref >60)
Glucose, Bld: 198 mg/dL — ABNORMAL HIGH (ref 70–99)
Potassium: 3.8 mmol/L (ref 3.5–5.1)
Sodium: 136 mmol/L (ref 135–145)

## 2022-04-30 LAB — TROPONIN I (HIGH SENSITIVITY): Troponin I (High Sensitivity): 6 ng/L (ref <18)

## 2022-04-30 MED ORDER — IPRATROPIUM-ALBUTEROL 0.5-2.5 (3) MG/3ML IN SOLN
3.0000 mL | Freq: Once | RESPIRATORY_TRACT | Status: AC
  Administered 2022-04-30: 3 mL via RESPIRATORY_TRACT
  Filled 2022-04-30: qty 3

## 2022-04-30 MED ORDER — IPRATROPIUM BROMIDE 0.02 % IN SOLN: 0.5000 mg | RESPIRATORY_TRACT | 0 refills | Status: DC | PRN

## 2022-04-30 MED ORDER — METHYLPREDNISOLONE SODIUM SUCC 125 MG IJ SOLR
125.0000 mg | Freq: Once | INTRAMUSCULAR | Status: AC
  Administered 2022-04-30: 125 mg via INTRAMUSCULAR
  Filled 2022-04-30: qty 2

## 2022-04-30 MED ORDER — PREDNISONE 20 MG PO TABS: ORAL_TABLET | ORAL | 0 refills | Status: DC

## 2022-04-30 NOTE — ED Provider Notes (Signed)
[facility] Provider Note    Event Date/Time   First MD Initiated Contact with Patient 04/30/22 0221     (approximate)   History   Asthma   HPI  Kelsey Mcclure is a 32 y.o. female who presents to the ED from home with a chief complaint of shortness of breath which started approximately 1 hour ago when she woke up.  Patient with a history of asthma, never intubated, last hospitalized approximately 2 years ago.  States she was outside a lot yesterday and her asthma is often exacerbated by pollen.  Denies associated fever/chills, cough, abdominal pain, nausea, vomiting or dizziness.     Past Medical History   Past Medical History:  Diagnosis Date   Asthma    Diabetes mellitus without complication    Eczema    Hypertension      Active Problem List   Patient Active Problem List   Diagnosis Date Noted   Epistaxis 04/04/2021   Establishing care with new doctor, encounter for 03/19/2021   Essential hypertension 02/04/2021   GERD (gastroesophageal reflux disease) 02/04/2021   Pulmonary vascular congestion 02/04/2021   Asthma 02/03/2021   Diabetes mellitus without complication    Acute respiratory failure with hypoxia 12/15/2019   Type 2 diabetes mellitus with hyperglycemia 10/26/2018   Morbid obesity 10/26/2018   Depression 10/26/2018   Asthma exacerbation 10/26/2018   Elevated BP without diagnosis of hypertension 10/26/2018   Posterior subcapsular polar cataract, nonsenile 12/08/2017     Past Surgical History   Past Surgical History:  Procedure Laterality Date   CATARACT EXTRACTION, BILATERAL       Home Medications   Prior to Admission medications   Medication Sig Start Date End Date Taking? Authorizing Provider  ipratropium (ATROVENT) 0.02 % nebulizer solution Take 2.5 mLs (0.5 mg total) by nebulization every 4 (four) hours as needed for wheezing or shortness of breath. 04/30/22  Yes Irean Hong, MD  predniSONE (DELTASONE) 20 MG  tablet 3 tablets daily x 4 days 04/30/22  Yes Irean Hong, MD  albuterol (PROVENTIL) (2.5 MG/3ML) 0.083% nebulizer solution Take 3 mLs (2.5 mg total) by nebulization every 6 (six) hours as needed for wheezing or shortness of breath. 03/23/21   Corky Downs, MD  albuterol (VENTOLIN HFA) 108 (90 Base) MCG/ACT inhaler INHALE 2 PUFFS BY MOUTH EVERY 4 HOURS AS NEEDED FOR SHORTNESS OF BREATH FOR WHEEZING 04/08/22   Kara Dies, NP  azithromycin (ZITHROMAX) 250 MG tablet Take 1 tablet (250 mg total) by mouth daily. Take first 2 tablets together, then 1 every day until finished. 11/10/21   Immordino, Jeannett Senior, FNP  blood glucose meter kit and supplies KIT Dispense based on patient and insurance preference. Use up to four times daily as directed. 02/03/21   Rushie Chestnut, PA-C  blood glucose meter kit and supplies Dispense based on patient and insurance preference. Use up to four times daily as directed. (FOR ICD-10 E10.9, E11.9). 04/02/21   Kara Dies, NP  DM-APAP-CPM (CORICIDIN HBP PO) Take 2 tablets by mouth every other day as needed. For cold, flu and chest congestion.    [provider]  doxycycline (VIBRAMYCIN) 100 MG capsule Take 1 capsule (100 mg total) by mouth 2 (two) times daily. 02/28/21   Bing Neighbors, NP  DULERA 100-5 MCG/ACT AERO Inhale 2 puffs by mouth twice daily 11/09/21   Corky Downs, MD  EPINEPHrine 0.3 mg/0.3 mL IJ SOAJ injection Inject 0.3 mg into the muscle as needed for  anaphylaxis.    [provider]  Fiber Adult Gummies 2 g CHEW Chew 4 tablets by mouth daily.    [provider]  halobetasol (ULTRAVATE) 0.05 % cream Apply 1 application. topically 2 (two) times daily. Apply topically twice daily after showers 06/11/21   Kara Dies, NP  irbesartan (AVAPRO) 75 MG tablet  06/09/21   [provider]  losartan-hydrochlorothiazide (HYZAAR) 100-12.5 MG tablet Take 1 tablet by mouth daily. 03/19/21   Kara Dies, NP  metFORMIN  (GLUCOPHAGE) 500 MG tablet Take 1 tablet (500 mg total) by mouth 2 (two) times daily with a meal. 03/19/21   Kara Dies, NP  metFORMIN (GLUCOPHAGE) 500 MG tablet Take by mouth. 03/19/21   [provider]  montelukast (SINGULAIR) 10 MG tablet Take 10 mg by mouth daily.    [provider]  mupirocin ointment (BACTROBAN) 2 % Apply 1 Application topically 2 (two) times daily. 08/12/21   Mickie Bail, NP     Allergies  Bee venom, Peanut oil, and Shellfish allergy   Family History   Family History  Problem Relation Age of Onset   Eczema Mother    Diabetes Father    Psoriasis Father    Eczema Father    Sleep apnea Father    Asthma Sister    Eczema Sister    Eczema Sister      Physical Exam  Triage Vital Signs: ED Triage Vitals  Enc Vitals Group     BP 04/29/22 2322 (!) 135/95     Pulse Rate 04/29/22 2322 84     Resp 04/29/22 2322 20     Temp 04/29/22 2322 98.1 F (36.7 C)     Temp src --      SpO2 04/29/22 2322 100 %     Weight 04/29/22 2317 (!) 340 lb (154.2 kg)     Height 04/29/22 2317 5\' 3"  (1.6 m)     Head Circumference --      Peak Flow --      Pain Score --      Pain Loc --      Pain Edu? --      Excl. in GC? --     Updated Vital Signs: BP (!) 128/92   Pulse 86   Temp 98.4 F (36.9 C) (Oral)   Resp 20   Ht 5\' 3"  (1.6 m)   Wt (!) 154.2 kg   LMP  (LMP Unknown)   SpO2 97%   BMI 60.23 kg/m    General: Awake, mild distress.  CV:  RRR.  Good peripheral perfusion.  Resp:  Normal effort.  Diminished, scattered wheezing. Abd:  Obese, nontender.  No distention.  Other:  Bilateral calves are supple without tenderness.   ED Results / Procedures / Treatments  Labs (all labs ordered are listed, but only abnormal results are displayed) Labs Reviewed  BASIC METABOLIC PANEL - Abnormal; Notable for the following components:      Result Value   Chloride 97 (*)    Glucose, Bld 198 (*)    All other components within normal limits  CBC -  Abnormal; Notable for the following components:   WBC 11.0 (*)    MCH 24.8 (*)    All other components within normal limits  POC URINE PREG, ED  TROPONIN I (HIGH SENSITIVITY)     EKG  ED ECG REPORT I, Overton Boggus J, the attending physician, personally viewed and interpreted this ECG.   Date: 04/30/2022  EKG Time: 2319  Rate: 101  Rhythm: sinus tachycardia  Axis: Normal  Intervals:none  ST&T Change: Nonspecific    RADIOLOGY I have independently visualized and interpreted patient's x-ray as well as noted the radiology interpretation:  Chest x-ray: No acute cardiopulmonary process  Official radiology report(s): DG Chest 2 View  Result Date: 04/29/2022 CLINICAL DATA:  Shortness of breath EXAM: CHEST - 2 VIEW COMPARISON:  Chest x-ray 12/02/2021 FINDINGS: The heart size and mediastinal contours are within normal limits. Both lungs are clear. The visualized skeletal structures are unremarkable. IMPRESSION: No active cardiopulmonary disease. Electronically Signed   By: Darliss Cheney M.D.   On: 04/29/2022 23:44     PROCEDURES:  Critical Care performed: No  .1-3 Lead EKG Interpretation  Performed by: Irean Hong, MD Authorized by: Irean Hong, MD     Interpretation: normal     ECG rate:  95   ECG rate assessment: normal     Rhythm: sinus rhythm     Ectopy: none     Conduction: normal   Comments:     Patient placed on cardiac monitor to evaluate for arrhythmias    MEDICATIONS ORDERED IN ED: Medications  methylPREDNISolone sodium succinate (SOLU-MEDROL) 125 mg/2 mL injection 125 mg (125 mg Intramuscular Given 04/30/22 0301)  ipratropium-albuterol (DUONEB) 0.5-2.5 (3) MG/3ML nebulizer solution 3 mL (3 mLs Nebulization Given 04/30/22 0302)  ipratropium-albuterol (DUONEB) 0.5-2.5 (3) MG/3ML nebulizer solution 3 mL (3 mLs Nebulization Given 04/30/22 0422)     IMPRESSION / MDM / ASSESSMENT AND PLAN / ED COURSE  I reviewed the triage vital signs and the nursing notes.                              32 year old female presenting with shortness of breath. Differential includes, but is not limited to, viral syndrome, bronchitis including COPD exacerbation, pneumonia, reactive airway disease including asthma, CHF including exacerbation with or without pulmonary/interstitial edema, pneumothorax, ACS, thoracic trauma, and pulmonary embolism.  I personally reviewed patient's records and note a PCP office visit on 04/02/2021 for diabetes, hypertension and asthma.  Patient's presentation is most consistent with acute presentation with potential threat to life or bodily function.  The patient is on the cardiac monitor to evaluate for evidence of arrhythmia and/or significant heart rate changes.  Laboratory results unremarkable.  Chest x-ray is clear.  Will administer IM Solu-Medrol, DuoNeb and reassess.  Clinical Course as of 04/30/22 0447  Sat Apr 30, 2022  0403 Aeration mildly improved but lungs remain tight.  Will administer second DuoNeb. [JS]  [id number] Patient is feeling and sounding much better after second DuoNeb.  Will discharge home on prednisone burst and refill ipratropium.  Strict return precautions given.  Patient verbalizes understanding and agrees with plan of care. [JS]    Clinical Course User Index [JS] Irean Hong, MD     FINAL CLINICAL IMPRESSION(S) / ED DIAGNOSES   Final diagnoses:  Moderate asthma with exacerbation, unspecified whether persistent     Rx / DC Orders   ED Discharge Orders          Ordered    predniSONE (DELTASONE) 20 MG tablet        04/30/22 0446    ipratropium (ATROVENT) 0.02 % nebulizer solution  Every 4 hours PRN        04/30/22 0446             Note:  This document was prepared using Dragon voice recognition  software and may include unintentional dictation errors.   Irean HongSung, Luisana Lutzke J, MD 04/30/22 [phone]

## 2022-04-30 NOTE — Discharge Instructions (Signed)
Take Prednisone daily until finished.  Use your nebulizer every 4 hours as needed for difficulty breathing.  Return to the ER for worsening symptoms, persistent vomiting, difficulty breathing or other concerns.

## 2022-06-10 ENCOUNTER — Other Ambulatory Visit: Payer: Self-pay | Admitting: Nurse Practitioner

## 2022-06-10 DIAGNOSIS — J4541 Moderate persistent asthma with (acute) exacerbation: Secondary | ICD-10-CM

## 2022-06-11 ENCOUNTER — Encounter: Payer: Self-pay | Admitting: Emergency Medicine

## 2022-06-11 ENCOUNTER — Ambulatory Visit
Admission: EM | Admit: 2022-06-11 | Discharge: 2022-06-11 | Disposition: A | Discharge status: Home / Self Care | Payer: Medicaid Other | Attending: Emergency Medicine | Admitting: Emergency Medicine

## 2022-06-11 DIAGNOSIS — J4541 Moderate persistent asthma with (acute) exacerbation: Secondary | ICD-10-CM | POA: Diagnosis not present

## 2022-06-11 MED ORDER — ALBUTEROL SULFATE (2.5 MG/3ML) 0.083% IN NEBU: 2.5000 mg | INHALATION_SOLUTION | RESPIRATORY_TRACT | Status: DC

## 2022-06-11 MED ORDER — PREDNISONE 10 MG PO TABS: 40.0000 mg | ORAL_TABLET | Freq: Every day | ORAL | 0 refills | Status: AC

## 2022-06-11 NOTE — ED Triage Notes (Signed)
Patient in office today c/o cough and wheezing x1d.  OTC: allergy med   Denies: N/V fever

## 2022-06-11 NOTE — Discharge Instructions (Addendum)
Use your albuterol as directed.    Take the prednisone as directed.  Follow-up with your primary care provider on Monday.    Go to the emergency department if you have worsening symptoms.

## 2022-06-11 NOTE — ED Provider Notes (Signed)
[name]    CSN: [id number] Arrival date & time: 06/11/22  4098      History   Chief Complaint Chief Complaint  Patient presents with   Cough   Wheezing    HPI Kelsey Mcclure is a 32 y.o. female.  Patient presents with 1 day history of cough, wheezing, shortness of breath.  She reports tightness in her chest when trying to take a breath.  Treatment at home with albuterol and allergy medication.  She denies fever, chills, chest pain, or other symptoms.  Patient was seen at [facility] ED on 04/30/2022; diagnosed with asthma exacerbation; treated with albuterol and steroid.  Patient reports her albuterol inhaler is empty.  Her medical history includes asthma, diabetes, hypertension, morbid obesity.  The history is provided by the patient and medical records.    Past Medical History:  Diagnosis Date   Asthma    Diabetes mellitus without complication (HCC)    Eczema    Hypertension     Patient Active Problem List   Diagnosis Date Noted   Epistaxis 04/04/2021   Establishing care with new doctor, encounter for 03/19/2021   Essential hypertension 02/04/2021   GERD (gastroesophageal reflux disease) 02/04/2021   Pulmonary vascular congestion 02/04/2021   Asthma 02/03/2021   Diabetes mellitus without complication (HCC)    Acute respiratory failure with hypoxia (HCC) 12/15/2019   Type 2 diabetes mellitus with hyperglycemia (HCC) 10/26/2018   Morbid obesity (HCC) 10/26/2018   Depression 10/26/2018   Asthma exacerbation 10/26/2018   Elevated BP without diagnosis of hypertension 10/26/2018   Posterior subcapsular polar cataract, 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      Home Medications    Prior to Admission medications   Medication Sig Start Date End Date Taking? Authorizing Provider  predniSONE (DELTASONE) 10 MG tablet Take 4 tablets (40  mg total) by mouth daily for 5 days. 06/11/22 06/16/22 Yes Mickie Bail, NP  albuterol (PROVENTIL) (2.5 MG/3ML) 0.083% nebulizer solution Take 3 mLs (2.5 mg total) by nebulization every 6 (six) hours as needed for wheezing or shortness of breath. 03/23/21   Corky Downs, MD  albuterol (VENTOLIN HFA) 108 (90 Base) MCG/ACT inhaler INHALE 2 PUFFS BY MOUTH EVERY 4 HOURS AS NEEDED FOR SHORTNESS OF BREATH OR WHEEZING 06/10/22   Kara Dies, NP  azithromycin (ZITHROMAX) 250 MG tablet Take 1 tablet (250 mg total) by mouth daily. Take first 2 tablets together, then 1 every day until finished. 11/10/21   Immordino, Jeannett Senior, FNP  blood glucose meter kit and supplies KIT Dispense based on patient and insurance preference. Use up to four times daily as directed. 02/03/21   Rushie Chestnut, PA-C  blood glucose meter kit and supplies Dispense based on patient and insurance preference. Use up to four times daily as directed. (FOR ICD-10 E10.9, E11.9). 04/02/21   Kara Dies, NP  DM-APAP-CPM (CORICIDIN HBP PO) Take 2 tablets by mouth every other day as needed. For cold, flu and chest congestion.    [provider]  doxycycline (VIBRAMYCIN) 100 MG capsule Take 1 capsule (100 mg total) by mouth 2 (two) times daily. 02/28/21   Bing Neighbors, NP  DULERA 100-5 MCG/ACT AERO Inhale 2 puffs by mouth twice daily 11/09/21   Corky Downs, MD  EPINEPHrine 0.3 mg/0.3 mL IJ SOAJ injection Inject 0.3 mg into the muscle as  needed for anaphylaxis.    [provider]  Fiber Adult Gummies 2 g CHEW Chew 4 tablets by mouth daily.    [provider]  halobetasol (ULTRAVATE) 0.05 % cream Apply 1 application. topically 2 (two) times daily. Apply topically twice daily after showers 06/11/21   Kara Dies, NP  ipratropium (ATROVENT) 0.02 % nebulizer solution Take 2.5 mLs (0.5 mg total) by nebulization every 4 (four) hours as needed for wheezing or shortness of breath. 04/30/22   Irean Hong, MD   irbesartan (AVAPRO) 75 MG tablet  06/09/21   [provider]  losartan-hydrochlorothiazide (HYZAAR) 100-12.5 MG tablet Take 1 tablet by mouth daily. 03/19/21   Kara Dies, NP  metFORMIN (GLUCOPHAGE) 500 MG tablet Take 1 tablet (500 mg total) by mouth 2 (two) times daily with a meal. 03/19/21   Kara Dies, NP  metFORMIN (GLUCOPHAGE) 500 MG tablet Take by mouth. 03/19/21   [provider]  montelukast (SINGULAIR) 10 MG tablet Take 10 mg by mouth daily.    [provider]  mupirocin ointment (BACTROBAN) 2 % Apply 1 Application topically 2 (two) times daily. 08/12/21   Mickie Bail, NP    Family History Family History  Problem Relation Age of Onset   Eczema Mother    Diabetes Father    Psoriasis Father    Eczema Father    Sleep apnea Father    Asthma Sister    Eczema Sister    Eczema Sister     Social History Social History   Tobacco Use   Smoking status: Former    Packs/day: .5    Types: Cigarettes    Quit date: 04/16/2019    Years since quitting: 3.1   Smokeless tobacco: Never  Vaping Use   Vaping Use: Never used  Substance Use Topics   Alcohol use: Yes    Alcohol/week: 4.0 standard drinks of alcohol    Types: 4 Shots of liquor per week    Comment: socially   Drug use: Not Currently    Types: Marijuana     Allergies   Bee venom, Peanut oil, and Shellfish allergy   Review of Systems Review of Systems  Constitutional:  Negative for chills and fever.  HENT:  Negative for ear pain and sore throat.   Respiratory:  Positive for cough, chest tightness, shortness of breath and wheezing.   Cardiovascular:  Negative for chest pain and palpitations.     Physical Exam Triage Vital Signs ED Triage Vitals  Enc Vitals Group     BP 06/11/22 0935 138/89     Pulse Rate 06/11/22 0935 92     Resp 06/11/22 0935 (!) 22     Temp 06/11/22 0935 98.9 F (37.2 C)     Temp Source 06/11/22 0935 Oral     SpO2 06/11/22 0935 92 %     Weight  06/11/22 0952 (!) 340 lb (154.2 kg)     Height --      Head Circumference --      Peak Flow --      Pain Score 06/11/22 0949 0     Pain Loc --      Pain Edu? --      Excl. in GC? --    No data found.  Updated Vital Signs BP 138/89 (BP Location: Left Arm)   Pulse 92   Temp 98.9 F (37.2 C) (Oral)   Resp (!) 22   Wt (!) 340 lb (154.2 kg)   LMP  05/10/2021   SpO2 95%   BMI 60.23 kg/m   Visual Acuity Right Eye Distance:   Left Eye Distance:   Bilateral Distance:    Right Eye Near:   Left Eye Near:    Bilateral Near:     Physical Exam Vitals and nursing note reviewed.  Constitutional:      General: She is not in acute distress.    Appearance: She is well-developed. She is obese. She is not ill-appearing.  HENT:     Right Ear: Tympanic membrane normal.     Left Ear: Tympanic membrane normal.     Nose: Nose normal.     Mouth/Throat:     Mouth: Mucous membranes are moist.     Pharynx: Oropharynx is clear.  Cardiovascular:     Rate and Rhythm: Normal rate and regular rhythm.     Heart sounds: Normal heart sounds.  Pulmonary:     Effort: Pulmonary effort is normal. No respiratory distress.     Breath sounds: Wheezing present.     Comments: Few scattered expiratory wheezes.   Musculoskeletal:     Cervical back: Neck supple.  Skin:    General: Skin is warm and dry.  Neurological:     Mental Status: She is alert.  Psychiatric:        Mood and Affect: Mood normal.        Behavior: Behavior normal.      UC Treatments / Results  Labs (all labs ordered are listed, but only abnormal results are displayed) Labs Reviewed - No data to display  EKG   Radiology No results found.  Procedures Procedures (including critical care time)  Medications Ordered in UC Medications  albuterol (PROVENTIL) (2.5 MG/3ML) 0.083% nebulizer solution 2.5 mg (has no administration in time range)    Initial Impression / Assessment and Plan / UC Course  I have reviewed the triage  vital signs and the nursing notes.  Pertinent labs & imaging results that were available during my care of the patient were reviewed by me and considered in my medical decision making (see chart for details).    Moderate persistent asthma with acute exacerbation.  Afebrile and vital signs are stable.  Albuterol nebulizer treatment given here and patient reports improvement in her symptoms.  O2 sat 95% on room air following the albuterol nebulizer treatment.  Patient reports she is out of her albuterol inhaler but her record shows that her PCP called in a refill yesterday.  Instructed patient to pick this up at the pharmacy.  Also treating today with prednisone.  Patient was seen at [facility] ED for an asthma exacerbation approximately 3 weeks ago.  Discussed with patient that she needs to follow-up with her PCP due to back-to-back exacerbations of her asthma.  Instructed her to follow-up with her PCP on Monday.  ED precautions given.  Patient agrees to plan of care.  Final Clinical Impressions(s) / UC Diagnoses   Final diagnoses:  Moderate persistent asthma with acute exacerbation     Discharge Instructions      Use your albuterol as directed.    Take the prednisone as directed.  Follow-up with your primary care provider on Monday.    Go to the emergency department if you have worsening symptoms.         ED Prescriptions     Medication Sig Dispense Auth. Provider   predniSONE (DELTASONE) 10 MG tablet Take 4 tablets (40 mg total) by mouth daily for 5 days. 20 tablet  Mickie Bail, NP      PDMP not reviewed this encounter.   Mickie Bail, NP 06/11/22 1018

## 2022-07-24 ENCOUNTER — Other Ambulatory Visit: Payer: Self-pay | Admitting: Nurse Practitioner

## 2022-07-24 DIAGNOSIS — J4541 Moderate persistent asthma with (acute) exacerbation: Secondary | ICD-10-CM
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
## 2022-07-26 NOTE — Telephone Encounter (Signed)
Left message for patient to call office please schedule patient an appointment once scheduled I can fill medication until appointment.

## 2022-08-11 ENCOUNTER — Other Ambulatory Visit: Payer: Self-pay | Admitting: Nurse Practitioner

## 2022-08-11 DIAGNOSIS — J4541 Moderate persistent asthma with (acute) exacerbation: Secondary | ICD-10-CM

## 2022-08-28 ENCOUNTER — Ambulatory Visit
Admission: EM | Admit: 2022-08-28 | Discharge: 2022-08-28 | Disposition: A | Discharge status: Home / Self Care | Payer: Medicaid Other | Attending: Internal Medicine | Admitting: Internal Medicine

## 2022-08-28 DIAGNOSIS — J4541 Moderate persistent asthma with (acute) exacerbation: Secondary | ICD-10-CM

## 2022-08-28 DIAGNOSIS — J209 Acute bronchitis, unspecified: Secondary | ICD-10-CM

## 2022-08-28 MED ORDER — DOXYCYCLINE HYCLATE 100 MG PO CAPS: 100.0000 mg | ORAL_CAPSULE | Freq: Two times a day (BID) | ORAL | 0 refills | Status: DC

## 2022-08-28 MED ORDER — PREDNISONE 10 MG (21) PO TBPK: ORAL_TABLET | Freq: Every day | ORAL | 0 refills | Status: DC

## 2022-08-28 NOTE — ED Provider Notes (Signed)
Kelsey Mcclure    CSN: [id number] Arrival date & time: 08/28/22  0859      History   Chief Complaint Chief Complaint  Patient presents with   Cough   Shortness of Breath   Wheezing    HPI Kelsey Mcclure is a 32 y.o. female who presents with onset of cough x 1.5 weeks ago and is getting worse wheezing and SOB  x 3 days. Has been using her Nebulizer frequently. She used to be a smoker, but no longer smokes. Her cough is productive with yellow mucous and is prone to get bronchitis. Denies having fever. Sweats or body aches.     Past Medical History:  Diagnosis Date   Asthma    Diabetes mellitus without complication (HCC)    Eczema    Hypertension     Patient Active Problem List   Diagnosis Date Noted   Epistaxis 04/04/2021   Establishing care with new doctor, encounter for 03/19/2021   Essential hypertension 02/04/2021   GERD (gastroesophageal reflux disease) 02/04/2021   Pulmonary vascular congestion 02/04/2021   Asthma 02/03/2021   Diabetes mellitus without complication (HCC)    Acute respiratory failure with hypoxia (HCC) 12/15/2019   Type 2 diabetes mellitus with hyperglycemia (HCC) 10/26/2018   Morbid obesity (HCC) 10/26/2018   Depression 10/26/2018   Asthma exacerbation 10/26/2018   Elevated BP without diagnosis of hypertension 10/26/2018   Posterior subcapsular polar cataract, 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      Home Medications    Prior to Admission medications   Medication Sig Start Date End Date Taking? Authorizing Provider  albuterol (PROVENTIL) (2.5 MG/3ML) 0.083% nebulizer solution Take 3 mLs (2.5 mg total) by nebulization every 6 (six) hours as needed for wheezing or shortness of breath. 03/23/21  Yes Corky Downs, MD  blood glucose meter kit and supplies KIT Dispense based on patient and insurance preference.  Use up to four times daily as directed. 02/03/21  Yes Covington, Sarah M, PA-C  blood glucose meter kit and supplies Dispense based on patient and insurance preference. Use up to four times daily as directed. (FOR ICD-10 E10.9, E11.9). 04/02/21  Yes Kara Dies, NP  Fiber Adult Gummies 2 g CHEW Chew 4 tablets by mouth daily.   Yes [provider]  halobetasol (ULTRAVATE) 0.05 % cream APPLY  CREAM TOPICALLY TWICE DAILY AFTER SHOWERS 08/14/22  Yes Kara Dies, NP  ipratropium (ATROVENT) 0.02 % nebulizer solution Take 2.5 mLs (0.5 mg total) by nebulization every 4 (four) hours as needed for wheezing or shortness of breath. 04/30/22  Yes Irean Hong, MD  montelukast (SINGULAIR) 10 MG tablet Take 10 mg by mouth daily.   Yes [provider]  predniSONE (STERAPRED UNI-PAK 21 TAB) 10 MG (21) TBPK tablet Take by mouth daily. Take 6 tabs by mouth daily  for 2 days, then 5 tabs for 2 days, then 4 tabs for 2 days, then 3 tabs for 2 days, 2 tabs for 2 days, then 1 tab by mouth daily for 2 days 08/28/22  Yes Rodriguez-Southworth, Nettie Elm, PA-C  DM-APAP-CPM (CORICIDIN HBP PO) Take 2 tablets by mouth every other day as needed. For cold, flu and chest congestion.    [provider]  doxycycline (VIBRAMYCIN) 100 MG capsule Take 1 capsule (100 mg total) by mouth 2 (two) times daily. 08/28/22  Rodriguez-Southworth, Nettie Elm, PA-C  DULERA 100-5 MCG/ACT AERO Inhale 2 puffs by mouth twice daily 11/09/21   Corky Downs, MD  EPINEPHrine 0.3 mg/0.3 mL IJ SOAJ injection Inject 0.3 mg into the muscle as needed for anaphylaxis.    [provider]  losartan-hydrochlorothiazide (HYZAAR) 100-12.5 MG tablet Take 1 tablet by mouth daily. 03/19/21   Kara Dies, NP  metFORMIN (GLUCOPHAGE) 500 MG tablet Take 1 tablet (500 mg total) by mouth 2 (two) times daily with a meal. 03/19/21   Kara Dies, NP  metFORMIN (GLUCOPHAGE) 500 MG tablet Take by mouth. 03/19/21   [provider]   mupirocin ointment (BACTROBAN) 2 % Apply 1 Application topically 2 (two) times daily. 08/12/21   Mickie Bail, NP  VENTOLIN HFA 108 (90 Base) MCG/ACT inhaler INHALE 2 PUFFS BY MOUTH EVERY 4 HOURS AS NEEDED FOR SHORTNESS OF BREATH FOR WHEEZING 08/14/22   Kara Dies, NP    Family History Family History  Problem Relation Age of Onset   Eczema Mother    Diabetes Father    Psoriasis Father    Eczema Father    Sleep apnea Father    Asthma Sister    Eczema Sister    Eczema Sister     Social History Social History   Tobacco Use   Smoking status: Former    Current packs/day: 0.00    Types: Cigarettes    Quit date: 04/16/2019    Years since quitting: 3.3   Smokeless tobacco: Never  Vaping Use   Vaping status: Never Used  Substance Use Topics   Alcohol use: Yes    Alcohol/week: 4.0 standard drinks of alcohol    Types: 4 Shots of liquor per week    Comment: socially   Drug use: Not Currently    Types: Marijuana     Allergies   Bee venom, Peanut oil, and Shellfish allergy   Review of Systems Review of Systems As noted in HPI  Physical Exam  No data found.  Updated Vital Signs BP (!) 159/126 (BP Location: Left Wrist)   Pulse 93   Temp 98.9 F (37.2 C) (Oral)   Resp 20   LMP 08/24/2021 Comment: irregular  SpO2 96%   Visual Acuity Right Eye Distance:   Left Eye Distance:   Bilateral Distance:    Right Eye Near:   Left Eye Near:    Bilateral Near:     Physical Exam Physical Exam Constitutional:      General: He is not in acute distress.    Appearance: He is not toxic-appearing.  HENT:     Head: Normocephalic.     Right Ear: Tympanic membrane, ear canal and external ear normal.     Left Ear: Ear canal and external ear normal.     Nose: Nose normal.     Mouth/Throat:     Mouth: Mucous membranes are moist.     Pharynx: Oropharynx is clear.  Eyes:     General: No scleral icterus.    Conjunctiva/sclera: Conjunctivae normal.  Cardiovascular:      Rate and Rhythm: Normal rate and regular rhythm.     Heart sounds: No murmur heard.   Pulmonary:     Effort: Pulmonary effort is normal. No respiratory distress.     Breath sounds: has mild expiratory Wheezing present.     Musculoskeletal:        General: Normal range of motion.     Cervical back: Neck supple.  Lymphadenopathy:     Cervical:  No cervical adenopathy.  Skin:    General: Skin is warm and dry.     Findings: No rash.  Neurological:     Mental Status: He is alert and oriented to person, place, and time.     Gait: Gait normal.  Psychiatric:        Mood and Affect: Mood normal.        Behavior: Behavior normal.        Thought Content: Thought content normal.        Judgment: Judgment normal.    UC Treatments / Results  Labs (all labs ordered are listed, but only abnormal results are displayed) Labs Reviewed - No data to display  EKG   Radiology No results found.  Procedures Procedures (including critical care time)  Medications Ordered in UC Medications - No data to display  Initial Impression / Assessment and Plan / UC Course  I have reviewed the triage vital signs and the nursing notes.  Asthma exacerbation URI  I placed pt on Doxy and Prednisone taper as noted FU prn.      Final Clinical Impressions(s) / UC Diagnoses   Final diagnoses:  Moderate persistent asthma with acute exacerbation  Acute bronchitis, unspecified organism   Discharge Instructions   None    ED Prescriptions     Medication Sig Dispense Auth. Provider   doxycycline (VIBRAMYCIN) 100 MG capsule Take 1 capsule (100 mg total) by mouth 2 (two) times daily. 20 capsule Rodriguez-Southworth, Nettie Elm, PA-C   predniSONE (STERAPRED UNI-PAK 21 TAB) 10 MG (21) TBPK tablet Take by mouth daily. Take 6 tabs by mouth daily  for 2 days, then 5 tabs for 2 days, then 4 tabs for 2 days, then 3 tabs for 2 days, 2 tabs for 2 days, then 1 tab by mouth daily for 2 days 42 tablet  Rodriguez-Southworth, Nettie Elm, PA-C      PDMP not reviewed this encounter.   Garey Ham, [place] 08/28/22 [phone]

## 2022-08-28 NOTE — ED Triage Notes (Signed)
Pt reports for 1.5 weeks she has had increased cough,SHOBand wheezing. Pt has had to increse the use of Nebulizer and HHN

## 2022-09-09 ENCOUNTER — Other Ambulatory Visit: Payer: Self-pay | Admitting: Nurse Practitioner

## 2022-09-09 NOTE — Telephone Encounter (Signed)
Refilled: 08/14/2022

## 2022-09-26 ENCOUNTER — Ambulatory Visit
Admission: EM | Admit: 2022-09-26 | Discharge: 2022-09-26 | Disposition: A | Discharge status: Home / Self Care | Payer: Medicaid Other | Attending: Emergency Medicine | Admitting: Emergency Medicine

## 2022-09-26 ENCOUNTER — Other Ambulatory Visit: Payer: Self-pay

## 2022-09-26 ENCOUNTER — Encounter: Payer: Self-pay | Admitting: Emergency Medicine

## 2022-09-26 DIAGNOSIS — J4541 Moderate persistent asthma with (acute) exacerbation: Secondary | ICD-10-CM

## 2022-09-26 DIAGNOSIS — J4540 Moderate persistent asthma, uncomplicated: Secondary | ICD-10-CM | POA: Diagnosis not present

## 2022-09-26 DIAGNOSIS — I10 Essential (primary) hypertension: Secondary | ICD-10-CM | POA: Diagnosis not present

## 2022-09-26 DIAGNOSIS — Z760 Encounter for issue of repeat prescription: Secondary | ICD-10-CM | POA: Diagnosis not present

## 2022-09-26 DIAGNOSIS — J45909 Unspecified asthma, uncomplicated: Secondary | ICD-10-CM

## 2022-09-26 MED ORDER — IPRATROPIUM BROMIDE 0.02 % IN SOLN: 0.5000 mg | RESPIRATORY_TRACT | 0 refills | Status: DC | PRN

## 2022-09-26 MED ORDER — ALBUTEROL SULFATE (2.5 MG/3ML) 0.083% IN NEBU: 2.5000 mg | INHALATION_SOLUTION | Freq: Four times a day (QID) | RESPIRATORY_TRACT | 0 refills | Status: DC | PRN

## 2022-09-26 MED ORDER — VENTOLIN HFA 108 (90 BASE) MCG/ACT IN AERS: 1.0000 | INHALATION_SPRAY | RESPIRATORY_TRACT | 0 refills | Status: DC | PRN

## 2022-09-26 NOTE — ED Provider Notes (Signed)
Kelsey Mcclure    CSN: [id number] Arrival date & time: 09/26/22  0859      History   Chief Complaint Chief Complaint  Patient presents with   Cough    HPI Kelsey Mcclure is a 32 y.o. female.  Patient presents with request for medication refills for her albuterol inhaler, ipratropium nebulizer, and albuterol nebulizer.  She also requests refill of Dulera but has not used this in months because of cost.  She has a mild cough and occasional wheezing.  She attempted to reach her PCP but reports her primary care provider is no longer available; she has an appointment to establish with a new PCP in October.  She denies fever, shortness of breath, chest pain, or other symptoms.  Her medical history includes moderate persistent asthma, diabetes, morbid obesity, hypertension.  Patient was seen at this urgent care on 08/28/2022; diagnosed with asthma exacerbation and bronchitis; treated with prednisone and doxycycline.  The history is provided by the patient and medical records.    Past Medical History:  Diagnosis Date   Asthma    Diabetes mellitus without complication (HCC)    Eczema    Hypertension     Patient Active Problem List   Diagnosis Date Noted   Epistaxis 04/04/2021   Establishing care with new doctor, encounter for 03/19/2021   Essential hypertension 02/04/2021   GERD (gastroesophageal reflux disease) 02/04/2021   Pulmonary vascular congestion 02/04/2021   Asthma 02/03/2021   Diabetes mellitus without complication (HCC)    Acute respiratory failure with hypoxia (HCC) 12/15/2019   Type 2 diabetes mellitus with hyperglycemia (HCC) 10/26/2018   Morbid obesity (HCC) 10/26/2018   Depression 10/26/2018   Asthma exacerbation 10/26/2018   Elevated BP without diagnosis of hypertension 10/26/2018   Posterior subcapsular polar cataract, nonsenile 12/08/2017    Past Surgical History:  Procedure Laterality Date   CATARACT EXTRACTION, BILATERAL      OB History   No  obstetric history on file.      Home Medications    Prior to Admission medications   Medication Sig Start Date End Date Taking? Authorizing Provider  albuterol (PROVENTIL) (2.5 MG/3ML) 0.083% nebulizer solution Take 3 mLs (2.5 mg total) by nebulization every 6 (six) hours as needed for wheezing or shortness of breath. 09/26/22   Mickie Bail, NP  blood glucose meter kit and supplies KIT Dispense based on patient and insurance preference. Use up to four times daily as directed. 02/03/21   Rushie Chestnut, PA-C  blood glucose meter kit and supplies Dispense based on patient and insurance preference. Use up to four times daily as directed. (FOR ICD-10 E10.9, E11.9). 04/02/21   Kara Dies, NP  DM-APAP-CPM (CORICIDIN HBP PO) Take 2 tablets by mouth every other day as needed. For cold, flu and chest congestion.    [provider]  DULERA 100-5 MCG/ACT AERO Inhale 2 puffs by mouth twice daily 11/09/21   Corky Downs, MD  EPINEPHrine 0.3 mg/0.3 mL IJ SOAJ injection Inject 0.3 mg into the muscle as needed for anaphylaxis.    [provider]  Fiber Adult Gummies 2 g CHEW Chew 4 tablets by mouth daily.    [provider]  halobetasol (ULTRAVATE) 0.05 % cream APPLY  CREAM TOPICALLY TWICE DAILY AFTER SHOWERS 09/14/22   Kara Dies, NP  ipratropium (ATROVENT) 0.02 % nebulizer solution Take 2.5 mLs (0.5 mg total) by nebulization every 4 (four) hours as needed for wheezing or shortness of breath. 09/26/22   Arlana Pouch,  Fredrich Romans, NP  losartan-hydrochlorothiazide (HYZAAR) 100-12.5 MG tablet Take 1 tablet by mouth daily. 03/19/21   Kara Dies, NP  metFORMIN (GLUCOPHAGE) 500 MG tablet Take 1 tablet (500 mg total) by mouth 2 (two) times daily with a meal. 03/19/21   Kara Dies, NP  metFORMIN (GLUCOPHAGE) 500 MG tablet Take by mouth. 03/19/21   [provider]  montelukast (SINGULAIR) 10 MG tablet Take 10 mg by mouth daily.    [provider]  mupirocin  ointment (BACTROBAN) 2 % Apply 1 Application topically 2 (two) times daily. 08/12/21   Mickie Bail, NP  VENTOLIN HFA 108 (90 Base) MCG/ACT inhaler Inhale 1-2 puffs into the lungs every 4 (four) hours as needed for wheezing or shortness of breath. 09/26/22   Mickie Bail, NP    Family History Family History  Problem Relation Age of Onset   Eczema Mother    Diabetes Father    Psoriasis Father    Eczema Father    Sleep apnea Father    Asthma Sister    Eczema Sister    Eczema Sister     Social History Social History   Tobacco Use   Smoking status: Former    Current packs/day: 0.00    Types: Cigarettes    Quit date: 04/16/2019    Years since quitting: 3.4   Smokeless tobacco: Never  Vaping Use   Vaping status: Never Used  Substance Use Topics   Alcohol use: Yes    Alcohol/week: 4.0 standard drinks of alcohol    Types: 4 Shots of liquor per week    Comment: socially   Drug use: Not Currently    Types: Marijuana     Allergies   Bee venom, Peanut oil, and Shellfish allergy   Review of Systems Review of Systems  Constitutional:  Negative for chills and fever.  HENT:  Negative for ear pain and sore throat.   Respiratory:  Positive for cough and wheezing. Negative for shortness of breath.   Cardiovascular:  Negative for chest pain and palpitations.     Physical Exam Triage Vital Signs ED Triage Vitals  Encounter Vitals Group     BP 09/26/22 0923 (!) 155/82     Systolic BP Percentile --      Diastolic BP Percentile --      Pulse Rate 09/26/22 0923 93     Resp 09/26/22 0923 20     Temp 09/26/22 0923 98 F (36.7 C)     Temp Source 09/26/22 0923 Oral     SpO2 09/26/22 0923 95 %     Weight 09/26/22 0924 (!) 330 lb (149.7 kg)     Height 09/26/22 0924 5\' 3"  (1.6 m)     Head Circumference --      Peak Flow --      Pain Score --      Pain Loc --      Pain Education --      Exclude from Growth Chart --    No data found.  Updated Vital Signs BP (!) 140/81    Pulse 93   Temp 98 F (36.7 C) (Oral)   Resp 20   Ht 5\' 3"  (1.6 m)   Wt (!) 330 lb (149.7 kg)   LMP 08/26/2021 Comment: irregular  SpO2 95%   BMI 58.46 kg/m   Visual Acuity Right Eye Distance:   Left Eye Distance:   Bilateral Distance:    Right Eye Near:   Left Eye Near:  Bilateral Near:     Physical Exam Vitals and nursing note reviewed.  Constitutional:      General: She is not in acute distress.    Appearance: She is well-developed. She is obese.  HENT:     Mouth/Throat:     Mouth: Mucous membranes are moist.  Cardiovascular:     Rate and Rhythm: Normal rate and regular rhythm.     Heart sounds: Normal heart sounds.  Pulmonary:     Effort: Pulmonary effort is normal. No respiratory distress.     Breath sounds: Normal breath sounds. No wheezing.  Musculoskeletal:     Cervical back: Neck supple.  Skin:    General: Skin is warm and dry.  Neurological:     Mental Status: She is alert.  Psychiatric:        Mood and Affect: Mood normal.        Behavior: Behavior normal.      UC Treatments / Results  Labs (all labs ordered are listed, but only abnormal results are displayed) Labs Reviewed - No data to display  EKG   Radiology No results found.  Procedures Procedures (including critical care time)  Medications Ordered in UC Medications - No data to display  Initial Impression / Assessment and Plan / UC Course  I have reviewed the triage vital signs and the nursing notes.  Pertinent labs & imaging results that were available during my care of the patient were reviewed by me and considered in my medical decision making (see chart for details).    Encounter for medication refill, moderate persistent asthma, elevated blood pressure reading with hypertension.  No respiratory distress, lungs are clear at this time, O2 sat 95% on room air.  Refill provided for albuterol inhaler, albuterol nebulizer solution, ipratropium nebulizer solution.  The patient has  not used the [facility] inhaler for a few months; I explained to her that we cannot refill this today as it is not a current medication.  Instructed her to follow-up with her PCP for additional refills.  Education provided on asthma.  Also discussed that her blood pressure is elevated today and needs to be rechecked by her PCP.  Education provided on managing hypertension.  She agrees to plan of care.  Final Clinical Impressions(s) / UC Diagnoses   Final diagnoses:  Encounter for medication refill  Moderate persistent asthma, unspecified whether complicated  Elevated blood pressure reading in office with diagnosis of hypertension     Discharge Instructions      Use the albuterol inhaler, albuterol nebulizer, and ipratropium nebulizer as directed.  Follow up with your primary care provider for additional refills.    Your blood pressure is elevated today at 155/82.  Please have this rechecked by your primary care provider in 2-4 weeks.           ED Prescriptions     Medication Sig Dispense Auth. Provider   ipratropium (ATROVENT) 0.02 % nebulizer solution  (Status: Discontinued) Take 2.5 mLs (0.5 mg total) by nebulization every 4 (four) hours as needed for wheezing or shortness of breath. 75 mL Mickie Bail, NP   albuterol (PROVENTIL) (2.5 MG/3ML) 0.083% nebulizer solution Take 3 mLs (2.5 mg total) by nebulization every 6 (six) hours as needed for wheezing or shortness of breath. 360 mL Mickie Bail, NP   VENTOLIN HFA 108 (90 Base) MCG/ACT inhaler Inhale 1-2 puffs into the lungs every 4 (four) hours as needed for wheezing or shortness of breath. 18 g Arlana Pouch,  Fredrich Romans, NP   ipratropium (ATROVENT) 0.02 % nebulizer solution Take 2.5 mLs (0.5 mg total) by nebulization every 4 (four) hours as needed for wheezing or shortness of breath. 75 mL Mickie Bail, NP      PDMP not reviewed this encounter.   Mickie Bail, NP 09/26/22 1006

## 2022-09-26 NOTE — ED Triage Notes (Addendum)
Pt here because ran out of meds when did not have insurance and when tried to get back in with pcp to get refills and an appointment they were not there; reports phone number not working and website down.  Pt does have cold per report but does not wish to be seen for this.  Has not taken bp meds yet today.  Pt does have appt end October with new pcp.  Would like refilled Albuterol inhaler Duoneb nebulizer med dulera

## 2022-09-26 NOTE — Discharge Instructions (Addendum)
Use the albuterol inhaler, albuterol nebulizer, and ipratropium nebulizer as directed.  Follow up with your primary care provider for additional refills.    Your blood pressure is elevated today at 155/82.  Please have this rechecked by your primary care provider in 2-4 weeks.

## 2022-10-18 ENCOUNTER — Other Ambulatory Visit: Payer: Self-pay | Admitting: Nurse Practitioner

## 2022-10-19 NOTE — Telephone Encounter (Signed)
Patient need follow-up last follow-up was in March 2023.Please call patient to schedule an appointment

## 2022-10-21 ENCOUNTER — Other Ambulatory Visit: Payer: Self-pay | Admitting: Nurse Practitioner

## 2022-11-30 ENCOUNTER — Telehealth: Payer: Self-pay

## 2022-11-30 ENCOUNTER — Other Ambulatory Visit: Payer: Self-pay

## 2022-11-30 DIAGNOSIS — J45909 Unspecified asthma, uncomplicated: Secondary | ICD-10-CM

## 2022-11-30 DIAGNOSIS — J4541 Moderate persistent asthma with (acute) exacerbation: Secondary | ICD-10-CM

## 2022-11-30 MED ORDER — DULERA 100-5 MCG/ACT IN AERO: 2.0000 | INHALATION_SPRAY | Freq: Two times a day (BID) | RESPIRATORY_TRACT | 0 refills | Status: DC

## 2022-11-30 MED ORDER — VENTOLIN HFA 108 (90 BASE) MCG/ACT IN AERS: 1.0000 | INHALATION_SPRAY | RESPIRATORY_TRACT | 0 refills | Status: DC | PRN

## 2022-11-30 NOTE — Telephone Encounter (Signed)
Prescription Request  11/30/2022  LOV: Visit date not found  What is the name of the medication or equipment? albuterol (PROVENTIL) (2.5 MG/3ML) 0.083% nebulizer solution and DULERA 100-5 MCG/ACT AERO  Have you contacted your pharmacy to request a refill? Yes   Which pharmacy would you like this sent to?  [facility] [address]), [place] - [address] [place]) [place] [address] Phone: [phone] Fax: [phone]    Patient notified that their request is being sent to the clinical staff for review and that they should receive a response within 2 business days.   Please advise at [facility] [phone]  Patient states her inhalers are getting low.  Patient states she had a tetanus shot at [facility] on 11/18/2022, and she would like to be sure that we received that information from them for her chart.

## 2022-11-30 NOTE — Telephone Encounter (Signed)
Document Tdap in chart.

## 2022-12-20 ENCOUNTER — Emergency Department
Admission: EM | Admit: 2022-12-20 | Discharge: 2022-12-20 | Disposition: A | Discharge status: Home / Self Care | Payer: Medicaid Other | Attending: Emergency Medicine | Admitting: Emergency Medicine

## 2022-12-20 ENCOUNTER — Emergency Department: Payer: Medicaid Other

## 2022-12-20 ENCOUNTER — Other Ambulatory Visit: Payer: Self-pay

## 2022-12-20 DIAGNOSIS — E119 Type 2 diabetes mellitus without complications: Secondary | ICD-10-CM | POA: Insufficient documentation

## 2022-12-20 DIAGNOSIS — J45901 Unspecified asthma with (acute) exacerbation: Secondary | ICD-10-CM | POA: Insufficient documentation

## 2022-12-20 DIAGNOSIS — I10 Essential (primary) hypertension: Secondary | ICD-10-CM | POA: Insufficient documentation

## 2022-12-20 DIAGNOSIS — R0602 Shortness of breath: Secondary | ICD-10-CM | POA: Diagnosis present

## 2022-12-20 LAB — CBC WITH DIFFERENTIAL/PLATELET
Abs Immature Granulocytes: 0.04 10*3/uL (ref 0.00–0.07)
Basophils Absolute: 0.1 10*3/uL (ref 0.0–0.1)
Basophils Relative: 1 %
Eosinophils Absolute: 0.4 10*3/uL (ref 0.0–0.5)
Eosinophils Relative: 4 %
HCT: 39.7 % (ref 36.0–46.0)
Hemoglobin: 12.1 g/dL (ref 12.0–15.0)
Immature Granulocytes: 0 %
Lymphocytes Relative: 29 %
Lymphs Abs: 2.9 10*3/uL (ref 0.7–4.0)
MCH: 24.9 pg — ABNORMAL LOW (ref 26.0–34.0)
MCHC: 30.5 g/dL (ref 30.0–36.0)
MCV: 81.7 fL (ref 80.0–100.0)
Monocytes Absolute: 0.5 10*3/uL (ref 0.1–1.0)
Monocytes Relative: 5 %
Neutro Abs: 6.1 10*3/uL (ref 1.7–7.7)
Neutrophils Relative %: 61 %
Platelets: 323 10*3/uL (ref 150–400)
RBC: 4.86 MIL/uL (ref 3.87–5.11)
RDW: 14.5 % (ref 11.5–15.5)
WBC: 10 10*3/uL (ref 4.0–10.5)
nRBC: 0 % (ref 0.0–0.2)

## 2022-12-20 LAB — BASIC METABOLIC PANEL
Anion gap: 8 (ref 5–15)
BUN: 15 mg/dL (ref 6–20)
CO2: 28 mmol/L (ref 22–32)
Calcium: 8.8 mg/dL — ABNORMAL LOW (ref 8.9–10.3)
Chloride: 99 mmol/L (ref 98–111)
Creatinine, Ser: 0.9 mg/dL (ref 0.44–1.00)
GFR, Estimated: >60 mL/min (ref >60)
Glucose, Bld: 324 mg/dL — ABNORMAL HIGH (ref 70–99)
Potassium: 3.9 mmol/L (ref 3.5–5.1)
Sodium: 135 mmol/L (ref 135–145)

## 2022-12-20 LAB — TROPONIN I (HIGH SENSITIVITY)
Troponin I (High Sensitivity): 4 ng/L (ref <18)
Troponin I (High Sensitivity): 4 ng/L (ref <18)

## 2022-12-20 MED ORDER — PREDNISONE 20 MG PO TABS
60.0000 mg | ORAL_TABLET | Freq: Once | ORAL | Status: AC
Start: 2022-12-20 — End: 2022-12-20
  Administered 2022-12-20: 60 mg via ORAL
  Filled 2022-12-20: qty 3

## 2022-12-20 MED ORDER — PREDNISONE 20 MG PO TABS: 60.0000 mg | ORAL_TABLET | Freq: Every day | ORAL | 0 refills | Status: AC

## 2022-12-20 MED ORDER — IPRATROPIUM BROMIDE 0.02 % IN SOLN: 0.5000 mg | RESPIRATORY_TRACT | 12 refills | Status: DC | PRN

## 2022-12-20 MED ORDER — IPRATROPIUM-ALBUTEROL 0.5-2.5 (3) MG/3ML IN SOLN
3.0000 mL | Freq: Once | RESPIRATORY_TRACT | Status: AC
  Administered 2022-12-20: 3 mL via RESPIRATORY_TRACT
  Filled 2022-12-20: qty 3

## 2022-12-20 NOTE — ED Notes (Signed)
Patient transported to X-ray 

## 2022-12-20 NOTE — ED Provider Notes (Signed)
[facility] Provider Note    Event Date/Time   First MD Initiated Contact with Patient 12/20/22 [phone]     (approximate)   History   Chief Complaint Shortness of Breath   HPI  Kelsey Mcclure is a 32 y.o. female with past medical history of hypertension, diabetes, and asthma who presents to the ED complaining of shortness of breath.  Patient reports that she woke up this morning with a cough and feeling short of breath, especially when walking.  She states that symptoms feel similar to prior asthma exacerbation and she tried a couple of breathing treatments at home with partial relief.  She asked if her mother would bring her to the hospital, but got very out of breath while walking to the car and decided to call EMS.  Patient denies any associated fevers and has not had any pain in her chest, also denies any pain or swelling in her legs.     Physical Exam   Triage Vital Signs: ED Triage Vitals  Encounter Vitals Group     BP      Systolic BP Percentile      Diastolic BP Percentile      Pulse      Resp      Temp      Temp src      SpO2      Weight      Height      Head Circumference      Peak Flow      Pain Score      Pain Loc      Pain Education      Exclude from Growth Chart     Most recent vital signs: Vitals:   12/20/22 0946 12/20/22 1100  BP: (!) 163/91 123/86  Pulse:  99  Resp:  (!) 21  Temp: 98 F (36.7 C)   SpO2:  94%    Constitutional: Alert and oriented. Eyes: Conjunctivae are normal. Head: Atraumatic. Nose: No congestion/rhinnorhea. Mouth/Throat: Mucous membranes are moist.  Cardiovascular: Tachycardic, regular rhythm. Grossly normal heart sounds.  2+ radial pulses bilaterally. Respiratory: Normal respiratory effort.  No retractions. Lungs with expiratory wheezing throughout. Gastrointestinal: Soft and nontender. No distention. Musculoskeletal: No lower extremity tenderness nor edema.  Neurologic:  Normal speech and  language. No gross focal neurologic deficits are appreciated.    ED Results / Procedures / Treatments   Labs (all labs ordered are listed, but only abnormal results are displayed) Labs Reviewed  CBC WITH DIFFERENTIAL/PLATELET - Abnormal; Notable for the following components:      Result Value   MCH 24.9 (*)    All other components within normal limits  BASIC METABOLIC PANEL - Abnormal; Notable for the following components:   Glucose, Bld 324 (*)    Calcium 8.8 (*)    All other components within normal limits  TROPONIN I (HIGH SENSITIVITY)  TROPONIN I (HIGH SENSITIVITY)     EKG  ED ECG REPORT I, Chesley Noon, the attending physician, personally viewed and interpreted this ECG.   Date: 12/20/2022  EKG Time: 9:42  Rate: 118  Rhythm: sinus tachycardia  Axis: Normal  Intervals:none  ST&T Change: None  RADIOLOGY Chest x-ray reviewed and interpreted by me with no infiltrate, edema, or effusion.  PROCEDURES:  Critical Care performed: No  Procedures   MEDICATIONS ORDERED IN ED: Medications  predniSONE (DELTASONE) tablet 60 mg (60 mg Oral Given 12/20/22 1015)  ipratropium-albuterol (DUONEB) 0.5-2.5 (3) MG/3ML nebulizer solution 3  mL (3 mLs Nebulization Given 12/20/22 1016)     IMPRESSION / MDM / ASSESSMENT AND PLAN / ED COURSE  I reviewed the triage vital signs and the nursing notes.                              32 y.o. female with past medical history of hypertension, diabetes, and asthma who presents to the ED complaining of shortness of breath starting this morning with cough.  Patient's presentation is most consistent with acute presentation with potential threat to life or bodily function.  Differential diagnosis includes, but is not limited to, asthma exacerbation, ACS, PE, pneumonia, pneumothorax, CHF, anemia, electrolyte abnormality, AKI.  Patient nontoxic-appearing and in no acute distress, vital signs remarkable for tachycardia but otherwise  reassuring.  She is not in any respiratory distress and maintaining oxygen saturations at 99% on room air.  She does have expiratory wheezing throughout and we will treat with prednisone as well as additional breathing treatment.  EKG shows sinus tachycardia with no ischemic changes, chest x-ray is unremarkable.  Labs are pending at this time.  2 sets of troponin within normal limits and remainder of labs are reassuring with no significant anemia, leukocytosis, electrolyte abnormality, or AKI.  Patient reports her breathing feels much better on reassessment, minimal wheezing noted and she is appropriate for outpatient management.  She was counseled to follow-up with her PCP and to return to the ED for new or worsening symptoms, patient agrees with plan.      FINAL CLINICAL IMPRESSION(S) / ED DIAGNOSES   Final diagnoses:  Exacerbation of persistent asthma, unspecified asthma severity     Rx / DC Orders   ED Discharge Orders          Ordered    predniSONE (DELTASONE) 20 MG tablet  Daily with breakfast        12/20/22 1246    ipratropium (ATROVENT) 0.02 % nebulizer solution  Every 4 hours PRN        12/20/22 1246             Note:  This document was prepared using Dragon voice recognition software and may include unintentional dictation errors.   Chesley Noon, MD 12/20/22 1247

## 2022-12-20 NOTE — ED Triage Notes (Signed)
Pt here via [facility] with SOB. Pt has a hx of asthma and htn but is not on medication. Pt states she is in need of a cpap but does not currently have a primary doctor. Pt took 2 albuterol txs before ems arrival. Pt RA oxygen was 93% and increased to 99% on 4L.

## 2022-12-20 NOTE — ED Notes (Signed)
ED Provider at bedside. 

## 2022-12-30 ENCOUNTER — Ambulatory Visit: Payer: Medicaid Other | Admitting: Nurse Practitioner

## 2023-01-03 ENCOUNTER — Ambulatory Visit: Payer: Medicaid Other | Admitting: Nurse Practitioner

## 2023-01-03 ENCOUNTER — Encounter: Payer: Self-pay | Admitting: Nurse Practitioner

## 2023-01-03 VITALS — BP 134/80 | HR 97 | Temp 98.1°F | Ht 63.0 in | Wt 345.8 lb

## 2023-01-03 DIAGNOSIS — E1165 Type 2 diabetes mellitus with hyperglycemia: Secondary | ICD-10-CM

## 2023-01-03 DIAGNOSIS — I10 Essential (primary) hypertension: Secondary | ICD-10-CM

## 2023-01-03 DIAGNOSIS — E785 Hyperlipidemia, unspecified: Secondary | ICD-10-CM

## 2023-01-03 DIAGNOSIS — T7803XS Anaphylactic reaction due to other fish, sequela: Secondary | ICD-10-CM | POA: Diagnosis not present

## 2023-01-03 DIAGNOSIS — J4541 Moderate persistent asthma with (acute) exacerbation: Secondary | ICD-10-CM

## 2023-01-03 DIAGNOSIS — J4530 Mild persistent asthma, uncomplicated: Secondary | ICD-10-CM

## 2023-01-03 LAB — COMPREHENSIVE METABOLIC PANEL
ALT: 22 U/L (ref 0–35)
AST: 19 U/L (ref 0–37)
Albumin: 3.9 g/dL (ref 3.5–5.2)
Alkaline Phosphatase: 77 U/L (ref 39–117)
BUN: 10 mg/dL (ref 6–23)
CO2: 33 meq/L — ABNORMAL HIGH (ref 19–32)
Calcium: 8.7 mg/dL (ref 8.4–10.5)
Chloride: 98 meq/L (ref 96–112)
Creatinine, Ser: 0.72 mg/dL (ref 0.40–1.20)
GFR: 108.91 mL/min (ref >60.00)
Glucose, Bld: 196 mg/dL — ABNORMAL HIGH (ref 70–99)
Potassium: 3.9 meq/L (ref 3.5–5.1)
Sodium: 136 meq/L (ref 135–145)
Total Bilirubin: 0.4 mg/dL (ref 0.2–1.2)
Total Protein: 7.5 g/dL (ref 6.0–8.3)

## 2023-01-03 LAB — CBC WITH DIFFERENTIAL/PLATELET
Basophils Absolute: 0 10*3/uL (ref 0.0–0.1)
Basophils Relative: 0.4 % (ref 0.0–3.0)
Eosinophils Absolute: 0.5 10*3/uL (ref 0.0–0.7)
Eosinophils Relative: 4.4 % (ref 0.0–5.0)
HCT: 39.7 % (ref 36.0–46.0)
Hemoglobin: 12.6 g/dL (ref 12.0–15.0)
Lymphocytes Relative: 27.4 % (ref 12.0–46.0)
Lymphs Abs: 2.9 10*3/uL (ref 0.7–4.0)
MCHC: 31.8 g/dL (ref 30.0–36.0)
MCV: 79.9 fL (ref 78.0–100.0)
Monocytes Absolute: 0.6 10*3/uL (ref 0.1–1.0)
Monocytes Relative: 6 % (ref 3.0–12.0)
Neutro Abs: 6.5 10*3/uL (ref 1.4–7.7)
Neutrophils Relative %: 61.8 % (ref 43.0–77.0)
Platelets: 347 10*3/uL (ref 150.0–400.0)
RBC: 4.96 Mil/uL (ref 3.87–5.11)
RDW: 14.7 % (ref 11.5–15.5)
WBC: 10.5 10*3/uL (ref 4.0–10.5)

## 2023-01-03 LAB — HEMOGLOBIN A1C: Hgb A1c MFr Bld: 10.9 % — ABNORMAL HIGH (ref 4.6–6.5)

## 2023-01-03 LAB — LIPID PANEL
Cholesterol: 178 mg/dL (ref 0–200)
HDL: 47.4 mg/dL (ref >39.00)
LDL Cholesterol: 115 mg/dL — ABNORMAL HIGH (ref 0–99)
NonHDL: 130.49
Total CHOL/HDL Ratio: 4
Triglycerides: 77 mg/dL (ref 0.0–149.0)
VLDL: 15.4 mg/dL (ref 0.0–40.0)

## 2023-01-03 LAB — MICROALBUMIN / CREATININE URINE RATIO
Creatinine,U: 136.1 mg/dL
Microalb Creat Ratio: 0.5 mg/g (ref 0.0–30.0)
Microalb, Ur: <0.7 mg/dL (ref 0.0–1.9)

## 2023-01-03 MED ORDER — EPINEPHRINE 0.3 MG/0.3ML IJ SOAJ: 0.3000 mg | INTRAMUSCULAR | 2 refills | Status: AC | PRN

## 2023-01-03 MED ORDER — VENTOLIN HFA 108 (90 BASE) MCG/ACT IN AERS: 1.0000 | INHALATION_SPRAY | RESPIRATORY_TRACT | 2 refills | Status: DC | PRN

## 2023-01-03 MED ORDER — IPRATROPIUM BROMIDE 0.02 % IN SOLN: 0.5000 mg | RESPIRATORY_TRACT | 12 refills | Status: AC | PRN

## 2023-01-03 NOTE — Progress Notes (Signed)
Established Patient Office Visit  Subjective:  Patient ID: Kelsey Mcclure, female    DOB: [birth year]  Age: 32 y.o. MRN: [id number]  CC:  Chief Complaint  Patient presents with   Medication Refill    Pt would like to discuss metformin as she has not taking it in a while    HPI  Kelsey Mcclure presents for chronic disease follow up. She has h/o of asthma, hypertension, diabetes and hyperlipidemia. She has no regular exercise routine, does not check BS at home. She works 3rd shift at [employer].  She was without insurance from almost last 1 year and was not able to get her medication refilled.  She is due for labs. [name] chest pain, constipation, HD.  Lab Results  Component Value Date   HGBA1C 10.9 (H) 01/03/2023     HPI   Past Medical History:  Diagnosis Date   Asthma    Diabetes mellitus without complication (HCC)    Eczema    Hypertension     Past Surgical History:  Procedure Laterality Date   CATARACT EXTRACTION, BILATERAL      Family History  Problem Relation Age of Onset   Eczema Mother    Diabetes Father    Psoriasis Father    Eczema Father    Sleep apnea Father    Asthma Sister    Eczema Sister    Eczema Sister     Social History   Socioeconomic History   Marital status: Single    Spouse name: Not on file   Number of children: Not on file   Years of education: Not on file   Highest education level: Not on file  Occupational History   Not on file  Tobacco Use   Smoking status: Former    Current packs/day: 0.00    Types: Cigarettes    Quit date: 04/16/2019    Years since quitting: 3.7   Smokeless tobacco: Never  Vaping Use   Vaping status: Never Used  Substance and Sexual Activity   Alcohol use: Yes    Alcohol/week: 4.0 standard drinks of alcohol    Types: 4 Shots of liquor per week    Comment: socially   Drug use: Not Currently    Types: Marijuana   Sexual activity: Not Currently  Other Topics Concern   Not on file  Social  History Narrative   Not on file   Social Drivers of Health   Financial Resource Strain: Not on file  Food Insecurity: Not on file  Transportation Needs: Not on file  Physical Activity: Inactive (10/30/2018)   Exercise Vital Sign    Days of Exercise per Week: 0 days    Minutes of Exercise per Session: 0 min  Stress: No Stress Concern Present (10/30/2018)   [employer] of Occupational Health - Occupational Stress Questionnaire    Feeling of Stress : Only a little  Social Connections: Socially Isolated (10/29/2018)   Social Connection and Isolation Panel [NHANES]    Frequency of Communication with Friends and Family: More than three times a week    Frequency of Social Gatherings with Friends and Family: More than three times a week    Attends Religious Services: Never    Database administrator or Organizations: No    Attends Banker Meetings: Never    Marital Status: Never married  Intimate Partner Violence: Not At Risk (10/29/2018)   Humiliation, Afraid, Rape, and Kick questionnaire    Fear of Current or Ex-Partner: No  Emotionally Abused: No    Physically Abused: No    Sexually Abused: No     Outpatient Medications Prior to Visit  Medication Sig Dispense Refill   albuterol (PROVENTIL) (2.5 MG/3ML) 0.083% nebulizer solution Take 3 mLs (2.5 mg total) by nebulization every 6 (six) hours as needed for wheezing or shortness of breath. 360 mL 0   blood glucose meter kit and supplies KIT Dispense based on patient and insurance preference. Use up to four times daily as directed. 1 each 0   blood glucose meter kit and supplies Dispense based on patient and insurance preference. Use up to four times daily as directed. (FOR ICD-10 E10.9, E11.9). 1 each 0   DM-APAP-CPM (CORICIDIN HBP PO) Take 2 tablets by mouth every other day as needed. For cold, flu and chest congestion.     Fiber Adult Gummies 2 g CHEW Chew 4 tablets by mouth daily.     halobetasol (ULTRAVATE) 0.05 %  cream APPLY CREAM TOPICALLY TWICE DAILY AFTER SHOWERS 45 g 0   mometasone-formoterol (DULERA) 100-5 MCG/ACT AERO Inhale 2 puffs into the lungs 2 (two) times daily. 39 g 0   montelukast (SINGULAIR) 10 MG tablet Take 10 mg by mouth daily.     mupirocin ointment (BACTROBAN) 2 % Apply 1 Application topically 2 (two) times daily. 22 g 0   EPINEPHrine 0.3 mg/0.3 mL IJ SOAJ injection Inject 0.3 mg into the muscle as needed for anaphylaxis.     ipratropium (ATROVENT) 0.02 % nebulizer solution Take 2.5 mLs (0.5 mg total) by nebulization every 4 (four) hours as needed for wheezing or shortness of breath. 75 mL 12   losartan-hydrochlorothiazide (HYZAAR) 100-12.5 MG tablet Take 1 tablet by mouth daily. 90 tablet 1   metFORMIN (GLUCOPHAGE) 500 MG tablet Take 1 tablet (500 mg total) by mouth 2 (two) times daily with a meal. 180 tablet 3   metFORMIN (GLUCOPHAGE) 500 MG tablet Take by mouth.     VENTOLIN HFA 108 (90 Base) MCG/ACT inhaler Inhale 1-2 puffs into the lungs every 4 (four) hours as needed for wheezing or shortness of breath. 18 g 0   No facility-administered medications prior to visit.    Allergies  Allergen Reactions   Bee Venom Anaphylaxis   Peanut Oil Anaphylaxis   Shellfish Allergy Hives    ROS Review of Systems Negative unless indicated in HPI.    Objective:    Physical Exam Constitutional:      Appearance: Normal appearance. She is obese.  HENT:     Right Ear: Tympanic membrane normal.     Left Ear: Tympanic membrane normal.     Mouth/Throat:     Mouth: Mucous membranes are moist.  Eyes:     Conjunctiva/sclera: Conjunctivae normal.     Pupils: Pupils are equal, round, and reactive to light.  Cardiovascular:     Rate and Rhythm: Normal rate and regular rhythm.     Pulses: Normal pulses.     Heart sounds: Normal heart sounds.  Pulmonary:     Effort: Pulmonary effort is normal.     Breath sounds: Normal breath sounds.  Abdominal:     General: Bowel sounds are normal.      Palpations: Abdomen is soft.  Musculoskeletal:     Cervical back: Normal range of motion. No tenderness.  Skin:    General: Skin is warm.     Findings: No bruising.  Neurological:     General: No focal deficit present.     Mental Status:  She is alert and oriented to person, place, and time. Mental status is at baseline.  Psychiatric:        Mood and Affect: Mood normal.        Behavior: Behavior normal.        Thought Content: Thought content normal.        Judgment: Judgment normal.     BP 134/80   Pulse 97   Temp 98.1 F (36.7 C)   Ht 5\' 3"  (1.6 m)   Wt (!) 345 lb 12.8 oz (156.9 kg)   SpO2 96%   BMI 61.26 kg/m  Wt Readings from Last 3 Encounters:  01/10/23 (!) 347 lb (157.4 kg)  01/03/23 (!) 345 lb 12.8 oz (156.9 kg)  12/20/22 (!) 330 lb 0.5 oz (149.7 kg)     Health Maintenance  Topic Date Due   Hepatitis C Screening  Never done   FOOT EXAM  10/22/2019   OPHTHALMOLOGY EXAM  07/06/2022   COVID-19 Vaccine (1 - 2024-25 season) 01/26/2023 (Originally 09/25/2022)   INFLUENZA VACCINE  04/24/2023 (Originally 08/25/2022)   HEMOGLOBIN A1C  07/04/2023   Cervical Cancer Screening (HPV/Pap Cotest)  11/19/2023   Diabetic kidney evaluation - eGFR measurement  01/03/2024   Diabetic kidney evaluation - Urine ACR  01/03/2024   DTaP/Tdap/Td (3 - Td or Tdap) 11/17/2032   HIV Screening  Completed   HPV VACCINES  Aged Out    There are no preventive care reminders to display for this patient.  Lab Results  Component Value Date   TSH 3.66 03/26/2021   Lab Results  Component Value Date   WBC 10.5 01/03/2023   HGB 12.6 01/03/2023   HCT 39.7 01/03/2023   MCV 79.9 01/03/2023   PLT 347.0 01/03/2023   Lab Results  Component Value Date   NA 136 01/03/2023   K 3.9 01/03/2023   CO2 33 (H) 01/03/2023   GLUCOSE 196 (H) 01/03/2023   BUN 10 01/03/2023   CREATININE 0.72 01/03/2023   BILITOT 0.4 01/03/2023   ALKPHOS 77 01/03/2023   AST 19 01/03/2023   ALT 22 01/03/2023   PROT 7.5  01/03/2023   ALBUMIN 3.9 01/03/2023   CALCIUM 8.7 01/03/2023   ANIONGAP 8 12/20/2022   GFR 108.91 01/03/2023   Lab Results  Component Value Date   CHOL 178 01/03/2023   Lab Results  Component Value Date   HDL 47.40 01/03/2023   Lab Results  Component Value Date   LDLCALC 115 (H) 01/03/2023   Lab Results  Component Value Date   TRIG 77.0 01/03/2023   Lab Results  Component Value Date   CHOLHDL 4 01/03/2023   Lab Results  Component Value Date   HGBA1C 10.9 (H) 01/03/2023      Assessment & Plan:  Type 2 diabetes mellitus with hyperglycemia, without long-term current use of insulin (HCC) Assessment & Plan: She does not check blood pressure at home has been.  Has been out of blood pressure medication almost a year.  Will check hemoglobin A1c and will start on medication accordingly.  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  Dispense: 1 each; Refill: 2  Moderate persistent asthma with exacerbation -     Ventolin HFA; Inhale 1-2 puffs into the lungs every 4 (four) hours as needed for wheezing or  shortness of breath.  Dispense: 18 g; Refill: 2  Essential hypertension Assessment & Plan: Patient BP  Vitals:   01/03/23 1302  BP: 134/80    in the office. Advised pt to follow a low sodium and heart healthy diet. Advise pt to limit the intake of sodium.  Patient is out of  blood pressure medication from almost a year. Check CBC, CMP, TSH    Orders: -     Lipid panel -     CBC with Differential/Platelet -     Comprehensive metabolic panel  Hyperlipidemia, unspecified hyperlipidemia type Assessment & Plan: Will check lipid panel. Advised diet management  and exercise.   Mild persistent asthma without complication Assessment & Plan: Stable.  Refilled albuterol and ipratropium bromide   Other orders -      Ipratropium Bromide; Take 2.5 mLs (0.5 mg total) by nebulization every 4 (four) hours as needed for wheezing or shortness of breath.  Dispense: 75 mL; Refill: 12    Follow-up: Return in about 1 week (around 01/10/2023) for medication management.   Kara Dies, NP

## 2023-01-03 NOTE — Patient Instructions (Addendum)
Please go to the lab for blood work. We will do follow up in 1 week  to discuss medication regime.

## 2023-01-10 ENCOUNTER — Ambulatory Visit: Payer: Medicaid Other | Admitting: Nurse Practitioner

## 2023-01-10 ENCOUNTER — Encounter: Payer: Self-pay | Admitting: Nurse Practitioner

## 2023-01-10 VITALS — BP 126/82 | HR 86 | Temp 98.3°F | Ht 63.0 in | Wt 347.0 lb

## 2023-01-10 DIAGNOSIS — Z7985 Long-term (current) use of injectable non-insulin antidiabetic drugs: Secondary | ICD-10-CM

## 2023-01-10 DIAGNOSIS — J4530 Mild persistent asthma, uncomplicated: Secondary | ICD-10-CM

## 2023-01-10 DIAGNOSIS — I10 Essential (primary) hypertension: Secondary | ICD-10-CM

## 2023-01-10 DIAGNOSIS — E1165 Type 2 diabetes mellitus with hyperglycemia: Secondary | ICD-10-CM | POA: Diagnosis not present

## 2023-01-10 MED ORDER — LOSARTAN POTASSIUM-HCTZ 50-12.5 MG PO TABS: 1.0000 | ORAL_TABLET | Freq: Every day | ORAL | 1 refills | Status: DC

## 2023-01-10 MED ORDER — METFORMIN HCL ER 500 MG PO TB24: 500.0000 mg | ORAL_TABLET | Freq: Every day | ORAL | 1 refills | Status: DC

## 2023-01-10 MED ORDER — SEMAGLUTIDE (1 MG/DOSE) 4 MG/3ML ~~LOC~~ SOPN: 1.0000 mg | PEN_INJECTOR | SUBCUTANEOUS | 2 refills | Status: DC

## 2023-01-10 MED ORDER — DEXCOM G7 SENSOR MISC: 3 refills | Status: AC

## 2023-01-10 NOTE — Progress Notes (Signed)
Established Patient Office Visit  Subjective:  Patient ID: Kelsey Mcclure, female    DOB: July 24, 1990  Age: 32 y.o. MRN: [id number]  CC:  Chief Complaint  Patient presents with   Medical Management of Chronic Issues    HPI  Kelsey Mcclure presents for lab review.  She was without insurance and was not taking any medication.  She has elevated hemoglobin A1c 10.9 and elevated LDL 115.  Discussed patient to use continuous glucose monitor and patient is agreeable.  Patient denies personal or family history of MTC and MEN 2.  We will provide patient with a sample for Ozempic.    She has no concerns at present. HPI   Past Medical History:  Diagnosis Date   Asthma    Diabetes mellitus without complication (HCC)    Eczema    Hypertension     Past Surgical History:  Procedure Laterality Date   CATARACT EXTRACTION, BILATERAL      Family History  Problem Relation Age of Onset   Eczema Mother    Diabetes Father    Psoriasis Father    Eczema Father    Sleep apnea Father    Asthma Sister    Eczema Sister    Eczema Sister     Social History   Socioeconomic History   Marital status: Single    Spouse name: Not on file   Number of children: Not on file   Years of education: Not on file   Highest education level: Not on file  Occupational History   Not on file  Tobacco Use   Smoking status: Former    Current packs/day: 0.00    Types: Cigarettes    Quit date: 04/16/2019    Years since quitting: 3.7   Smokeless tobacco: Never  Vaping Use   Vaping status: Never Used  Substance and Sexual Activity   Alcohol use: Yes    Alcohol/week: 4.0 standard drinks of alcohol    Types: 4 Shots of liquor per week    Comment: socially   Drug use: Not Currently    Types: Marijuana   Sexual activity: Not Currently  Other Topics Concern   Not on file  Social History Narrative   Not on file   Social Drivers of Health   Financial Resource Strain: Not on file  Food  Insecurity: Not on file  Transportation Needs: Not on file  Physical Activity: Inactive (10/30/2018)   Exercise Vital Sign    Days of Exercise per Week: 0 days    Minutes of Exercise per Session: 0 min  Stress: No Stress Concern Present (10/30/2018)   [employer] of Occupational Health - Occupational Stress Questionnaire    Feeling of Stress : Only a little  Social Connections: Socially Isolated (10/29/2018)   Social Connection and Isolation Panel [NHANES]    Frequency of Communication with Friends and Family: More than three times a week    Frequency of Social Gatherings with Friends and Family: More than three times a week    Attends Religious Services: Never    Database administrator or Organizations: No    Attends Banker Meetings: Never    Marital Status: Never married  Intimate Partner Violence: Not At Risk (10/29/2018)   Humiliation, Afraid, Rape, and Kick questionnaire    Fear of Current or Ex-Partner: No    Emotionally Abused: No    Physically Abused: No    Sexually Abused: No     Outpatient Medications Prior to Visit  Medication Sig Dispense Refill   albuterol (PROVENTIL) (2.5 MG/3ML) 0.083% nebulizer solution Take 3 mLs (2.5 mg total) by nebulization every 6 (six) hours as needed for wheezing or shortness of breath. 360 mL 0   blood glucose meter kit and supplies KIT Dispense based on patient and insurance preference. Use up to four times daily as directed. 1 each 0   blood glucose meter kit and supplies Dispense based on patient and insurance preference. Use up to four times daily as directed. (FOR ICD-10 E10.9, E11.9). 1 each 0   DM-APAP-CPM (CORICIDIN HBP PO) Take 2 tablets by mouth every other day as needed. For cold, flu and chest congestion.     EPINEPHrine 0.3 mg/0.3 mL IJ SOAJ injection Inject 0.3 mg into the muscle as needed for anaphylaxis. 1 each 2   Fiber Adult Gummies 2 g CHEW Chew 4 tablets by mouth daily.     halobetasol (ULTRAVATE) 0.05 %  cream APPLY CREAM TOPICALLY TWICE DAILY AFTER SHOWERS 45 g 0   ipratropium (ATROVENT) 0.02 % nebulizer solution Take 2.5 mLs (0.5 mg total) by nebulization every 4 (four) hours as needed for wheezing or shortness of breath. 75 mL 12   mometasone-formoterol (DULERA) 100-5 MCG/ACT AERO Inhale 2 puffs into the lungs 2 (two) times daily. 39 g 0   montelukast (SINGULAIR) 10 MG tablet Take 10 mg by mouth daily.     mupirocin ointment (BACTROBAN) 2 % Apply 1 Application topically 2 (two) times daily. 22 g 0   VENTOLIN HFA 108 (90 Base) MCG/ACT inhaler Inhale 1-2 puffs into the lungs every 4 (four) hours as needed for wheezing or shortness of breath. 18 g 2   losartan-hydrochlorothiazide (HYZAAR) 100-12.5 MG tablet Take 1 tablet by mouth daily. 90 tablet 1   metFORMIN (GLUCOPHAGE) 500 MG tablet Take 1 tablet (500 mg total) by mouth 2 (two) times daily with a meal. 180 tablet 3   metFORMIN (GLUCOPHAGE) 500 MG tablet Take by mouth.     No facility-administered medications prior to visit.    Allergies  Allergen Reactions   Bee Venom Anaphylaxis   Peanut Oil Anaphylaxis   Shellfish Allergy Hives    ROS Review of Systems Negative unless indicated in HPI.    Objective:    Physical Exam Constitutional:      Appearance: Normal appearance.  HENT:     Mouth/Throat:     Mouth: Mucous membranes are moist.  Cardiovascular:     Rate and Rhythm: Normal rate and regular rhythm.     Pulses: Normal pulses.     Heart sounds: Normal heart sounds.  Pulmonary:     Effort: Pulmonary effort is normal.     Breath sounds: Normal breath sounds.  Musculoskeletal:     Cervical back: Normal range of motion. No tenderness.  Skin:    General: Skin is warm.     Findings: No bruising.  Neurological:     General: No focal deficit present.     Mental Status: She is alert and oriented to person, place, and time. Mental status is at baseline.  Psychiatric:        Mood and Affect: Mood normal.        Behavior:  Behavior normal.        Thought Content: Thought content normal.        Judgment: Judgment normal.     BP 126/82   Pulse 86   Temp 98.3 F (36.8 C)   Ht 5\' 3"  (1.6 m)  Wt (!) 347 lb (157.4 kg)   SpO2 92%   BMI 61.47 kg/m  Wt Readings from Last 3 Encounters:  01/10/23 (!) 347 lb (157.4 kg)  01/03/23 (!) 345 lb 12.8 oz (156.9 kg)  12/20/22 (!) 330 lb 0.5 oz (149.7 kg)     Health Maintenance  Topic Date Due   Hepatitis C Screening  Never done   FOOT EXAM  10/22/2019   OPHTHALMOLOGY EXAM  07/06/2022   COVID-19 Vaccine (1 - 2024-25 season) 01/26/2023 (Originally 09/25/2022)   INFLUENZA VACCINE  04/24/2023 (Originally 08/25/2022)   HEMOGLOBIN A1C  07/04/2023   Cervical Cancer Screening (HPV/Pap Cotest)  11/19/2023   Diabetic kidney evaluation - eGFR measurement  01/03/2024   Diabetic kidney evaluation - Urine ACR  01/03/2024   DTaP/Tdap/Td (3 - Td or Tdap) 11/17/2032   HIV Screening  Completed   HPV VACCINES  Aged Out    There are no preventive care reminders to display for this patient.  Lab Results  Component Value Date   TSH 3.66 03/26/2021   Lab Results  Component Value Date   WBC 10.5 01/03/2023   HGB 12.6 01/03/2023   HCT 39.7 01/03/2023   MCV 79.9 01/03/2023   PLT 347.0 01/03/2023   Lab Results  Component Value Date   NA 136 01/03/2023   K 3.9 01/03/2023   CO2 33 (H) 01/03/2023   GLUCOSE 196 (H) 01/03/2023   BUN 10 01/03/2023   CREATININE 0.72 01/03/2023   BILITOT 0.4 01/03/2023   ALKPHOS 77 01/03/2023   AST 19 01/03/2023   ALT 22 01/03/2023   PROT 7.5 01/03/2023   ALBUMIN 3.9 01/03/2023   CALCIUM 8.7 01/03/2023   ANIONGAP 8 12/20/2022   GFR 108.91 01/03/2023   Lab Results  Component Value Date   CHOL 178 01/03/2023   Lab Results  Component Value Date   HDL 47.40 01/03/2023   Lab Results  Component Value Date   LDLCALC 115 (H) 01/03/2023   Lab Results  Component Value Date   TRIG 77.0 01/03/2023   Lab Results  Component Value Date    CHOLHDL 4 01/03/2023   Lab Results  Component Value Date   HGBA1C 10.9 (H) 01/03/2023      Assessment & Plan:  Essential hypertension Assessment & Plan: Patient BP  Vitals:   01/10/23 1334  BP: 126/82    in the office. Advised pt to follow a low sodium and heart healthy diet. Advise pt to limit the intake of sodium.  Will start her on losartan/HCTZ 50-12.5 Mg daily. Advised patient to check blood pressure at home and bring the readings during next appointment     Type 2 diabetes mellitus with hyperglycemia, unspecified whether long term insulin use (HCC) Assessment & Plan: Sample of Ozempic 0.25 Mg weekly dose provided to the patient.  Advised her to start metformin the week after Ozempic. Diet management and exercise discussed. Will continue to monitor   Orders: -     Dexcom G7 Sensor; Change the sensor every 10 days.  Dispense: 4 each; Refill: 3  Mild persistent asthma without complication Assessment & Plan: Chronic stable on as needed albuterol inhaler.    Morbid obesity (HCC) Assessment & Plan: Body mass index is 61.47 kg/m. Advised patient to avoid trans fat, fatty and fried food. Follow a regular physical activity schedule. Went over the risk of chronic diseases with increased weight.   Will try Ozempic for diabetes and weight management.     Other orders -  Losartan Potassium-HCTZ; Take 1 tablet by mouth daily.  Dispense: 90 tablet; Refill: 1 -     metFORMIN HCl ER; Take 1 tablet (500 mg total) by mouth daily with breakfast.  Dispense: 90 tablet; Refill: 1 -     Semaglutide (1 MG/DOSE); Inject 1 mg as directed once a week.  Dispense: 3 mL; Refill: 2    Follow-up: Return in about 4 weeks (around 02/07/2023).   Kara Dies, NP

## 2023-01-10 NOTE — Patient Instructions (Addendum)
Sample of ozempic 0.25 mg provided. Start taking it on the coming Sunday weekly 0.25 mg for 4 weeks and then increase to 0.5 mg for another 4 week.  Start taking metformin one week after starting the ozempic to watch for GI side effect.   Will start on Continuous glucose monitor, Nurse visit om 12/19/ 3:45 PM   .

## 2023-01-11 ENCOUNTER — Telehealth: Payer: Self-pay | Admitting: Nurse Practitioner

## 2023-01-11 ENCOUNTER — Ambulatory Visit: Payer: Self-pay | Admitting: Nurse Practitioner

## 2023-01-11 NOTE — Telephone Encounter (Signed)
Copied from CRM [id number]. Topic: Clinical - Medication Question >> Jan 11, 2023  2:35 PM Kathryne Eriksson wrote: Reason for CRM: No appointment has been scheduled. Patient has an appointment scheduled for tomorrow. Per scheduling protocol, she requires a prior authorization for her medication prior to being seen.  Route to department's PEC pool.

## 2023-01-11 NOTE — Telephone Encounter (Signed)
  Chief Complaint: Patient has an appointment scheduled  for  tomorrow for education on new Diabetic equipment.  Patient states the pharmacy called her to advise she needs a prior Berkley Harvey is received the equipment from the pharmacy.   RN agent consulted with [facility]  and she is going to submit the request to her prior Auth team. They will cancel patient appointment for tomorrow and reschedule once the prior Auth is completed. Patient verbalized understanding.   [x]  Follow-up with PCP

## 2023-01-11 NOTE — Telephone Encounter (Signed)
Patient was transferred to our office. She needs auth on CGM. Advised will not be done in time to have teaching tomorrow. Would like our office to call to reschedule nurse visit once auth received.

## 2023-01-11 NOTE — Telephone Encounter (Signed)
Called patient she is aware that has been sent for auth. She would like call to reschedule visit for teaching once auth has been received.

## 2023-01-11 NOTE — Telephone Encounter (Signed)
Reason for Disposition . [1] Caller has URGENT medicine question about med that PCP or specialist prescribed AND [2] triager unable to answer question  Protocols used: Medication Question Call-A-AH

## 2023-01-12 ENCOUNTER — Telehealth: Payer: Self-pay

## 2023-01-12 ENCOUNTER — Ambulatory Visit: Payer: Medicaid Other

## 2023-01-13 ENCOUNTER — Other Ambulatory Visit (HOSPITAL_COMMUNITY): Payer: Self-pay

## 2023-01-13 ENCOUNTER — Telehealth: Payer: Self-pay

## 2023-01-13 NOTE — Telephone Encounter (Signed)
PA request has been Submitted. New Encounter created for follow up. For additional info see Pharmacy Prior Auth telephone encounter from 01/13/23.

## 2023-01-13 NOTE — Telephone Encounter (Signed)
error 

## 2023-01-13 NOTE — Telephone Encounter (Signed)
Pharmacy Patient Advocate Encounter   Received notification from Pt Calls Messages that prior authorization for Dexcom G7 Sensor is required/requested.   Insurance verification completed.   The patient is insured through [facility] .   Per test claim: PA required; PA submitted to above mentioned insurance via CoverMyMeds Key/confirmation #/EOC [id number] Status is pending

## 2023-01-13 NOTE — Telephone Encounter (Signed)
Pharmacy Patient Advocate Encounter  Received notification from [facility] that Prior Authorization for Dexcom G7 Sensor  has been DENIED.  See denial reason below. No denial letter attached in CMM. Will attach denial letter to Media tab once received.   PA #/Case ID/Reference #: [id number]

## 2023-01-15 DIAGNOSIS — E785 Hyperlipidemia, unspecified: Secondary | ICD-10-CM | POA: Insufficient documentation

## 2023-01-15 NOTE — Assessment & Plan Note (Signed)
She does not check blood pressure at home has been.  Has been out of blood pressure medication almost a year.  Will check hemoglobin A1c and will start on medication accordingly.

## 2023-01-15 NOTE — Assessment & Plan Note (Signed)
Will check lipid panel. Advised diet management  and exercise.

## 2023-01-15 NOTE — Assessment & Plan Note (Addendum)
Stable.  Refilled albuterol and ipratropium bromide

## 2023-01-15 NOTE — Assessment & Plan Note (Signed)
Patient BP  Vitals:   01/03/23 1302  BP: 134/80    in the office. Advised pt to follow a low sodium and heart healthy diet. Advise pt to limit the intake of sodium.  Patient is out of  blood pressure medication from almost a year. Check CBC, CMP, TSH

## 2023-01-16 NOTE — Telephone Encounter (Signed)
noted 

## 2023-01-18 NOTE — Assessment & Plan Note (Signed)
Patient BP  Vitals:   01/10/23 1334  BP: 126/82    in the office. Advised pt to follow a low sodium and heart healthy diet. Advise pt to limit the intake of sodium.  Will start her on losartan/HCTZ 50-12.5 Mg daily. Advised patient to check blood pressure at home and bring the readings during next appointment

## 2023-01-18 NOTE — Assessment & Plan Note (Signed)
Body mass index is 61.47 kg/m. Advised patient to avoid trans fat, fatty and fried food. Follow a regular physical activity schedule. Went over the risk of chronic diseases with increased weight.   Will try Ozempic for diabetes and weight management.

## 2023-01-18 NOTE — Assessment & Plan Note (Signed)
Sample of Ozempic 0.25 Mg weekly dose provided to the patient.  Advised her to start metformin the week after Ozempic. Diet management and exercise discussed. Will continue to monitor

## 2023-01-18 NOTE — Assessment & Plan Note (Signed)
Chronic stable on as needed albuterol inhaler.

## 2023-01-24 ENCOUNTER — Telehealth: Payer: Self-pay

## 2023-01-24 NOTE — Telephone Encounter (Signed)
 Copied from CRM [id number]. Topic: Clinical - Prescription Issue >> Jan 24, 2023  9:11 AM Truddie Crumble wrote: Reason for CRM: Patient called stating she is checking the status of her glucose monitor prior authorization

## 2023-01-25 ENCOUNTER — Other Ambulatory Visit: Payer: Self-pay | Admitting: Nurse Practitioner

## 2023-01-25 DIAGNOSIS — E1165 Type 2 diabetes mellitus with hyperglycemia: Secondary | ICD-10-CM

## 2023-01-25 MED ORDER — FREESTYLE LIBRE 2 SENSOR MISC: 1 refills | Status: AC

## 2023-01-25 NOTE — Progress Notes (Signed)
 Patient insurance denied prior authorization for Dexcom sensor will try freestyle libre.

## 2023-01-25 NOTE — Telephone Encounter (Signed)
 From patient insurance denied Dexcom we will try freestyle libre.

## 2023-01-26 NOTE — Telephone Encounter (Signed)
 Noted.

## 2023-02-07 ENCOUNTER — Telehealth: Payer: Self-pay

## 2023-02-07 ENCOUNTER — Encounter: Payer: Self-pay | Admitting: Nurse Practitioner

## 2023-02-07 ENCOUNTER — Ambulatory Visit: Payer: Medicaid Other | Admitting: Nurse Practitioner

## 2023-02-07 VITALS — BP 136/82 | HR 94 | Temp 97.7°F | Ht 63.0 in | Wt 339.6 lb

## 2023-02-07 DIAGNOSIS — F419 Anxiety disorder, unspecified: Secondary | ICD-10-CM | POA: Diagnosis not present

## 2023-02-07 DIAGNOSIS — I10 Essential (primary) hypertension: Secondary | ICD-10-CM | POA: Diagnosis not present

## 2023-02-07 DIAGNOSIS — F32A Depression, unspecified: Secondary | ICD-10-CM

## 2023-02-07 DIAGNOSIS — F322 Major depressive disorder, single episode, severe without psychotic features: Secondary | ICD-10-CM | POA: Diagnosis not present

## 2023-02-07 DIAGNOSIS — R040 Epistaxis: Secondary | ICD-10-CM

## 2023-02-07 DIAGNOSIS — Z7985 Long-term (current) use of injectable non-insulin antidiabetic drugs: Secondary | ICD-10-CM

## 2023-02-07 DIAGNOSIS — E1165 Type 2 diabetes mellitus with hyperglycemia: Secondary | ICD-10-CM

## 2023-02-07 DIAGNOSIS — J4551 Severe persistent asthma with (acute) exacerbation: Secondary | ICD-10-CM

## 2023-02-07 MED ORDER — SERTRALINE HCL 25 MG PO TABS: 25.0000 mg | ORAL_TABLET | Freq: Every day | ORAL | 1 refills | Status: DC

## 2023-02-07 NOTE — Progress Notes (Signed)
 Established Patient Office Visit  Subjective:  Patient ID: Kelsey Mcclure, female    DOB: 11-03-1990  Age: 33 y.o. MRN: [id number]  CC:  Chief Complaint  Patient presents with   Medical Management of Chronic Issues   Discussed the use of a AI scribe  software for clinical note transcription with the patient, who gave verbal consent to proceed.   HPI  Kelsey Mcclure presents for follow up on diabetes, hypertension and anxiety.  Diabetes: Reports experiencing gastrointestinal side effects, including constant stomach pain and diarrhea, after starting metformin  three weeks ago. Has lost 8 lb since starting Ozempic .  Hypertension: Have checked  BP a few times but did not record the readings. They recall one reading being around 180 sytolic.   Anxiety: The patient also expresses a desire to start anti-anxiety medication.  Has been dealing with frequent nosebleeds, which they attribute to nasal polyps diagnosed a couple of years ago during an ER visit. The nosebleeds are described as constant and sometimes severe, often occurring during work hours. As used Afrin in the past with some improvement. Requesting ENT referral.   HPI   Past Medical History:  Diagnosis Date   Asthma    Diabetes mellitus without complication (HCC)    Eczema    Hypertension     Past Surgical History:  Procedure Laterality Date   CATARACT EXTRACTION, BILATERAL      Family History  Problem Relation Age of Onset   Eczema Mother    Diabetes Father    Psoriasis Father    Eczema Father    Sleep apnea Father    Asthma Sister    Eczema Sister    Eczema Sister     Social History   Socioeconomic History   Marital status: Single    Spouse name: Not on file   Number of children: Not on file   Years of education: Not on file   Highest education level: Not on file  Occupational History   Not on file  Tobacco Use   Smoking status: Former    Current packs/day: 0.00    Types: Cigarettes     Quit date: 04/16/2019    Years since quitting: 3.8   Smokeless tobacco: Never  Vaping Use   Vaping status: Never Used  Substance and Sexual Activity   Alcohol use: Yes    Alcohol/week: 4.0 standard drinks of alcohol    Types: 4 Shots of liquor per week    Comment: socially   Drug use: Not Currently    Types: Marijuana   Sexual activity: Not Currently  Other Topics Concern   Not on file  Social History Narrative   Not on file   Social Drivers of Health   Financial Resource Strain: Not on file  Food Insecurity: Not on file  Transportation Needs: Not on file  Physical Activity: Inactive (10/30/2018)   Exercise Vital Sign    Days of Exercise per Week: 0 days    Minutes of Exercise per Session: 0 min  Stress: No Stress Concern Present (10/30/2018)   [employer] of Occupational Health - Occupational Stress Questionnaire    Feeling of Stress : Only a little  Social Connections: Socially Isolated (10/29/2018)   Social Connection and Isolation Panel [NHANES]    Frequency of Communication with Friends and Family: More than three times a week    Frequency of Social Gatherings with Friends and Family: More than three times a week    Attends Religious Services: Never  Active Member of Clubs or Organizations: No    Attends Banker Meetings: Never    Marital Status: Never married  Intimate Partner Violence: Not At Risk (10/29/2018)   Humiliation, Afraid, Rape, and Kick questionnaire    Fear of Current or Ex-Partner: No    Emotionally Abused: No    Physically Abused: No    Sexually Abused: No     Outpatient Medications Prior to Visit  Medication Sig Dispense Refill   albuterol  (PROVENTIL ) (2.5 MG/3ML) 0.083% nebulizer solution Take 3 mLs (2.5 mg total) by nebulization every 6 (six) hours as needed for wheezing or shortness of breath. 360 mL 0   blood glucose meter kit and supplies KIT Dispense based on patient and insurance preference. Use up to four times daily as  directed. 1 each 0   blood glucose meter kit and supplies Dispense based on patient and insurance preference. Use up to four times daily as directed. (FOR ICD-10 E10.9, E11.9). 1 each 0   Continuous Glucose Sensor (DEXCOM G7 SENSOR) MISC Change the sensor every 10 days. 4 each 3   Continuous Glucose Sensor (FREESTYLE LIBRE 2 SENSOR) MISC Change the sensor every 14 days 2 each 1   DM-APAP-CPM (CORICIDIN HBP PO) Take 2 tablets by mouth every other day as needed. For cold, flu and chest congestion.     EPINEPHrine  0.3 mg/0.3 mL IJ SOAJ injection Inject 0.3 mg into the muscle as needed for anaphylaxis. 1 each 2   Fiber Adult Gummies 2 g CHEW Chew 4 tablets by mouth daily.     halobetasol  (ULTRAVATE ) 0.05 % cream APPLY CREAM TOPICALLY TWICE DAILY AFTER SHOWERS 45 g 0   ipratropium (ATROVENT ) 0.02 % nebulizer solution Take 2.5 mLs (0.5 mg total) by nebulization every 4 (four) hours as needed for wheezing or shortness of breath. 75 mL 12   losartan -hydrochlorothiazide  (HYZAAR) 50-12.5 MG tablet Take 1 tablet by mouth daily. 90 tablet 1   mometasone -formoterol  (DULERA ) 100-5 MCG/ACT AERO Inhale 2 puffs into the lungs 2 (two) times daily. 39 g 0   montelukast  (SINGULAIR ) 10 MG tablet Take 10 mg by mouth daily.     mupirocin  ointment (BACTROBAN ) 2 % Apply 1 Application topically 2 (two) times daily. 22 g 0   Semaglutide , 1 MG/DOSE, 4 MG/3ML SOPN Inject 1 mg as directed once a week. 3 mL 2   VENTOLIN  HFA 108 (90 Base) MCG/ACT inhaler Inhale 1-2 puffs into the lungs every 4 (four) hours as needed for wheezing or shortness of breath. 18 g 2   metFORMIN  (GLUCOPHAGE -XR) 500 MG 24 hr tablet Take 1 tablet (500 mg total) by mouth daily with breakfast. 90 tablet 1   No facility-administered medications prior to visit.    Allergies  Allergen Reactions   Bee Venom Anaphylaxis   Peanut Oil Anaphylaxis   Shellfish Allergy Hives   Metformin  And Related Nausea And Vomiting    ROS Review of Systems Negative  unless indicated in HPI.    Objective:    Physical Exam Constitutional:      Appearance: Normal appearance.  HENT:     Mouth/Throat:     Mouth: Mucous membranes are moist.  Eyes:     Conjunctiva/sclera: Conjunctivae normal.     Pupils: Pupils are equal, round, and reactive to light.  Cardiovascular:     Rate and Rhythm: Normal rate and regular rhythm.     Pulses: Normal pulses.     Heart sounds: Normal heart sounds.  Pulmonary:  Effort: Pulmonary effort is normal.     Breath sounds: Normal breath sounds.  Musculoskeletal:     Cervical back: Normal range of motion. No tenderness.  Skin:    General: Skin is warm.     Findings: No bruising.  Neurological:     General: No focal deficit present.     Mental Status: She is alert and oriented to person, place, and time. Mental status is at baseline.  Psychiatric:        Mood and Affect: Mood normal.        Behavior: Behavior normal.        Thought Content: Thought content normal.        Judgment: Judgment normal.     BP 136/82   Pulse 94   Temp 97.7 F (36.5 C)   Ht 5' 3 (1.6 m)   Wt (!) 339 lb 9.6 oz (154 kg)   SpO2 97%   BMI 60.16 kg/m  Wt Readings from Last 3 Encounters:  02/07/23 (!) 339 lb 9.6 oz (154 kg)  01/10/23 (!) 347 lb (157.4 kg)  01/03/23 (!) 345 lb 12.8 oz (156.9 kg)     Health Maintenance  Topic Date Due   Hepatitis C Screening  Never done   Pneumococcal Vaccine 67-60 Years old (2 of 2 - PCV) 10/22/2019   FOOT EXAM  10/22/2019   OPHTHALMOLOGY EXAM  07/06/2022   COVID-19 Vaccine (1 - 2024-25 season) 02/22/2023 (Originally 09/25/2022)   INFLUENZA VACCINE  04/24/2023 (Originally 08/25/2022)   HEMOGLOBIN A1C  07/04/2023   Cervical Cancer Screening (HPV/Pap Cotest)  11/19/2023   Diabetic kidney evaluation - eGFR measurement  01/03/2024   Diabetic kidney evaluation - Urine ACR  01/03/2024   DTaP/Tdap/Td (3 - Td or Tdap) 11/17/2032   HIV Screening  Completed   HPV VACCINES  Aged Out    There are  no preventive care reminders to display for this patient.  Lab Results  Component Value Date   TSH 3.66 03/26/2021   Lab Results  Component Value Date   WBC 10.5 01/03/2023   HGB 12.6 01/03/2023   HCT 39.7 01/03/2023   MCV 79.9 01/03/2023   PLT 347.0 01/03/2023   Lab Results  Component Value Date   NA 136 01/03/2023   K 3.9 01/03/2023   CO2 33 (H) 01/03/2023   GLUCOSE 196 (H) 01/03/2023   BUN 10 01/03/2023   CREATININE 0.72 01/03/2023   BILITOT 0.4 01/03/2023   ALKPHOS 77 01/03/2023   AST 19 01/03/2023   ALT 22 01/03/2023   PROT 7.5 01/03/2023   ALBUMIN 3.9 01/03/2023   CALCIUM  8.7 01/03/2023   ANIONGAP 8 12/20/2022   GFR 108.91 01/03/2023   Lab Results  Component Value Date   CHOL 178 01/03/2023   Lab Results  Component Value Date   HDL 47.40 01/03/2023   Lab Results  Component Value Date   LDLCALC 115 (H) 01/03/2023   Lab Results  Component Value Date   TRIG 77.0 01/03/2023   Lab Results  Component Value Date   CHOLHDL 4 01/03/2023   Lab Results  Component Value Date   HGBA1C 10.9 (H) 01/03/2023      Assessment & Plan:  Anxiety and depression -     Sertraline  HCl; Take 1 tablet (25 mg total) by mouth daily.  Dispense: 30 tablet; Refill: 1  Epistaxis Assessment & Plan: Patient reports frequent nosebleeds and history of nasal polyps. -Referral to ENT for evaluation and management.  Orders: -  Ambulatory referral to ENT  Moderately severe major depression (HCC) Assessment & Plan: Patient reports combination of anxiety and depression. -Start Zoloft  25mg  daily.  Side effects of medication discussed   Type 2 diabetes mellitus with hyperglycemia, unspecified whether long term insulin  use (HCC) Assessment & Plan: Patient reports GI side effects with Metformin . -Discontinue Metformin  due to persistent GI side effects. -Increase Ozempic  to 0.5mg  for 4 weeks, then increase to 1mg  if tolerated and covered by insurance. -Check with pharmacy  regarding coverage for CGM monitor.  Orders: -     Hemoglobin A1c; Future  Essential hypertension Assessment & Plan: Blood pressure readings 136/82 in the office. -Check blood pressure 3-4 times per week and send readings via MyChart in 2 weeks. -Consider increasing Losartan  to 100mg  if blood pressure remains elevated.      Follow-up: Return in about 2 months (around 04/07/2023) for follow up with fasting lab 2 days prior, diabetes.   Kayela Humphres, NP

## 2023-02-07 NOTE — Telephone Encounter (Signed)
 [facility] on [address]. States the Patient needs a PA for the [employer] ordered on 01/25/23.

## 2023-02-07 NOTE — Patient Instructions (Addendum)
 Stop Metformin  due to GI side effect.  We will increase your Ozempic  dose to 0.5mg  for 4 weeks, and then to 1mg  if you tolerate it well and it is covered by your insurance. Please check with your pharmacy regarding coverage for a continuous glucose monitor (CGM).  Please check your blood pressure 3-4 times per week and send the readings to us  via MyChart in 2 weeks  We will start you on Zoloft  25mg  daily. Monitor for side effects such as sweating, decreased sexual drive, headache, and sleep disturbances.  Referral sent to ENT

## 2023-02-12 NOTE — Assessment & Plan Note (Signed)
Blood pressure readings 136/82 in the office. -Check blood pressure 3-4 times per week and send readings via MyChart in 2 weeks. -Consider increasing Losartan to 100mg  if blood pressure remains elevated.

## 2023-02-12 NOTE — Assessment & Plan Note (Signed)
Patient reports GI side effects with Metformin. -Discontinue Metformin due to persistent GI side effects. -Increase Ozempic to 0.5mg  for 4 weeks, then increase to 1mg  if tolerated and covered by insurance. -Check with pharmacy regarding coverage for CGM monitor.

## 2023-02-12 NOTE — Assessment & Plan Note (Signed)
Patient reports combination of anxiety and depression. -Start Zoloft 25mg  daily.  Side effects of medication discussed

## 2023-02-12 NOTE — Assessment & Plan Note (Signed)
Patient reports frequent nosebleeds and history of nasal polyps. -Referral to ENT for evaluation and management.

## 2023-02-14 ENCOUNTER — Telehealth: Payer: Self-pay

## 2023-02-14 NOTE — Telephone Encounter (Signed)
Pharmacy Patient Advocate Encounter   Received notification from Pt Calls Messages that prior authorization for FreeStyle Libre 14 Day Sensor is required/requested.   Insurance verification completed.   The patient is insured through [facility] .   Per test claim: PA required; PA submitted to above mentioned insurance via CoverMyMeds Key/confirmation #/EOC [id number] Status is pending

## 2023-02-14 NOTE — Telephone Encounter (Signed)
Noted  

## 2023-02-15 NOTE — Telephone Encounter (Signed)
Pharmacy Patient Advocate Encounter  Received notification from [facility] that Prior Authorization for FreeStyle Libre 14 Day Sensor  has been DENIED.  See denial reason below. No denial letter attached in CMM. Will attach denial letter to Media tab once received.   PA #/Case ID/Reference #: [id number]

## 2023-03-08 ENCOUNTER — Other Ambulatory Visit: Payer: Self-pay | Admitting: Nurse Practitioner

## 2023-03-08 ENCOUNTER — Other Ambulatory Visit: Payer: Self-pay | Admitting: Family

## 2023-03-08 ENCOUNTER — Telehealth: Payer: Self-pay

## 2023-03-08 DIAGNOSIS — J45909 Unspecified asthma, uncomplicated: Secondary | ICD-10-CM

## 2023-03-08 DIAGNOSIS — J4541 Moderate persistent asthma with (acute) exacerbation: Secondary | ICD-10-CM

## 2023-03-08 NOTE — Telephone Encounter (Signed)
Received a letter that this Patient needs a PA for [employer] 14 day sensor.

## 2023-03-09 NOTE — Telephone Encounter (Signed)
Sent Patient a my chart message with the PA denial.

## 2023-03-09 NOTE — Telephone Encounter (Signed)
PA request has been Denied. For additional info see Pharmacy Prior Auth telephone encounter from 02/14/23.

## 2023-03-13 ENCOUNTER — Telehealth: Payer: Self-pay

## 2023-03-13 NOTE — Telephone Encounter (Signed)
 Pharmacy Patient Advocate Encounter  Received notification from [facility] that Prior Authorization for Ozempic (1 MG/DOSE) 4MG /3ML pen-injectors has been APPROVED from 03/13/2023 to 03/12/2024   PA #/Case ID/Reference #: [id number] KEY:  [id number]

## 2023-03-29 ENCOUNTER — Other Ambulatory Visit: Payer: Self-pay | Admitting: *Deleted

## 2023-03-29 DIAGNOSIS — I10 Essential (primary) hypertension: Secondary | ICD-10-CM

## 2023-03-29 DIAGNOSIS — E1165 Type 2 diabetes mellitus with hyperglycemia: Secondary | ICD-10-CM

## 2023-04-05 ENCOUNTER — Other Ambulatory Visit (INDEPENDENT_AMBULATORY_CARE_PROVIDER_SITE_OTHER): Payer: Medicaid Other

## 2023-04-05 DIAGNOSIS — E1165 Type 2 diabetes mellitus with hyperglycemia: Secondary | ICD-10-CM | POA: Diagnosis not present

## 2023-04-05 DIAGNOSIS — I10 Essential (primary) hypertension: Secondary | ICD-10-CM

## 2023-04-05 LAB — CBC WITH DIFFERENTIAL/PLATELET
Basophils Absolute: 0.1 10*3/uL (ref 0.0–0.1)
Basophils Relative: 0.8 % (ref 0.0–3.0)
Eosinophils Absolute: 0.5 10*3/uL (ref 0.0–0.7)
Eosinophils Relative: 5.3 % — ABNORMAL HIGH (ref 0.0–5.0)
HCT: 40.9 % (ref 36.0–46.0)
Hemoglobin: 13 g/dL (ref 12.0–15.0)
Lymphocytes Relative: 36.7 % (ref 12.0–46.0)
Lymphs Abs: 3.8 10*3/uL (ref 0.7–4.0)
MCHC: 31.9 g/dL (ref 30.0–36.0)
MCV: 78.9 fl (ref 78.0–100.0)
Monocytes Absolute: 0.6 10*3/uL (ref 0.1–1.0)
Monocytes Relative: 6.1 % (ref 3.0–12.0)
Neutro Abs: 5.2 10*3/uL (ref 1.4–7.7)
Neutrophils Relative %: 51.1 % (ref 43.0–77.0)
Platelets: 398 10*3/uL (ref 150.0–400.0)
RBC: 5.18 Mil/uL — ABNORMAL HIGH (ref 3.87–5.11)
RDW: 14.3 % (ref 11.5–15.5)
WBC: 10.2 10*3/uL (ref 4.0–10.5)

## 2023-04-05 LAB — COMPREHENSIVE METABOLIC PANEL
ALT: 28 U/L (ref 0–35)
AST: 23 U/L (ref 0–37)
Albumin: 4.3 g/dL (ref 3.5–5.2)
Alkaline Phosphatase: 83 U/L (ref 39–117)
BUN: 10 mg/dL (ref 6–23)
CO2: 32 meq/L (ref 19–32)
Calcium: 9.6 mg/dL (ref 8.4–10.5)
Chloride: 97 meq/L (ref 96–112)
Creatinine, Ser: 0.84 mg/dL (ref 0.40–1.20)
GFR: 90.36 mL/min (ref >60.00)
Glucose, Bld: 103 mg/dL — ABNORMAL HIGH (ref 70–99)
Potassium: 3.9 meq/L (ref 3.5–5.1)
Sodium: 136 meq/L (ref 135–145)
Total Bilirubin: 0.4 mg/dL (ref 0.2–1.2)
Total Protein: 8.1 g/dL (ref 6.0–8.3)

## 2023-04-05 LAB — LIPID PANEL
Cholesterol: 176 mg/dL (ref 0–200)
HDL: 40.5 mg/dL (ref >39.00)
LDL Cholesterol: 113 mg/dL — ABNORMAL HIGH (ref 0–99)
NonHDL: 135.39
Total CHOL/HDL Ratio: 4
Triglycerides: 112 mg/dL (ref 0.0–149.0)
VLDL: 22.4 mg/dL (ref 0.0–40.0)

## 2023-04-05 LAB — HEMOGLOBIN A1C: Hgb A1c MFr Bld: 8.4 % — ABNORMAL HIGH (ref 4.6–6.5)

## 2023-04-07 ENCOUNTER — Ambulatory Visit: Payer: Medicaid Other | Admitting: Nurse Practitioner

## 2023-04-07 ENCOUNTER — Encounter: Payer: Self-pay | Admitting: Nurse Practitioner

## 2023-04-07 VITALS — BP 130/76 | HR 72 | Temp 97.9°F | Ht 63.5 in | Wt 324.2 lb

## 2023-04-07 DIAGNOSIS — H18603 Keratoconus, unspecified, bilateral: Secondary | ICD-10-CM | POA: Diagnosis not present

## 2023-04-07 DIAGNOSIS — L308 Other specified dermatitis: Secondary | ICD-10-CM

## 2023-04-07 DIAGNOSIS — J45909 Unspecified asthma, uncomplicated: Secondary | ICD-10-CM | POA: Diagnosis not present

## 2023-04-07 DIAGNOSIS — I10 Essential (primary) hypertension: Secondary | ICD-10-CM | POA: Diagnosis not present

## 2023-04-07 DIAGNOSIS — F32A Depression, unspecified: Secondary | ICD-10-CM

## 2023-04-07 DIAGNOSIS — Z7985 Long-term (current) use of injectable non-insulin antidiabetic drugs: Secondary | ICD-10-CM

## 2023-04-07 DIAGNOSIS — F419 Anxiety disorder, unspecified: Secondary | ICD-10-CM

## 2023-04-07 DIAGNOSIS — E1165 Type 2 diabetes mellitus with hyperglycemia: Secondary | ICD-10-CM

## 2023-04-07 MED ORDER — LOSARTAN POTASSIUM-HCTZ 50-12.5 MG PO TABS: 1.0000 | ORAL_TABLET | Freq: Every day | ORAL | 1 refills | Status: AC

## 2023-04-07 MED ORDER — SERTRALINE HCL 50 MG PO TABS: 50.0000 mg | ORAL_TABLET | Freq: Every day | ORAL | 3 refills | Status: AC

## 2023-04-07 MED ORDER — TRIAMCINOLONE ACETONIDE 0.5 % EX OINT: 1.0000 | TOPICAL_OINTMENT | Freq: Two times a day (BID) | CUTANEOUS | 2 refills | Status: AC

## 2023-04-07 MED ORDER — DULERA 100-5 MCG/ACT IN AERO: 2.0000 | INHALATION_SPRAY | Freq: Two times a day (BID) | RESPIRATORY_TRACT | 0 refills | Status: DC

## 2023-04-07 MED ORDER — ALBUTEROL SULFATE (2.5 MG/3ML) 0.083% IN NEBU: 2.5000 mg | INHALATION_SOLUTION | Freq: Four times a day (QID) | RESPIRATORY_TRACT | 0 refills | Status: AC | PRN

## 2023-04-07 MED ORDER — SEMAGLUTIDE (1 MG/DOSE) 4 MG/3ML ~~LOC~~ SOPN: 1.0000 mg | PEN_INJECTOR | SUBCUTANEOUS | 2 refills | Status: AC

## 2023-04-07 NOTE — Progress Notes (Signed)
 Established Patient Office Visit  Subjective:  Patient ID: Kelsey Mcclure, female    DOB: 12-15-1990  Age: 33 y.o. MRN: [id number]  CC:  Chief Complaint  Patient presents with   Medical Management of Chronic Issues    EXCEMA MEDIATION NOT WORKING   Discussed the use of a AI scribe software for clinical note transcription with the patient, who gave verbal consent to proceed.  HPI  Kelsey Mcclure is a 33 year old female with diabetes and hypertension who presents for follow-up on her chronic conditions.  Her hemoglobin A1c has decreased from 10.9% to 8.4%, indicating improved glycemic control. She attributes this improvement to her current medication, Ozempic.  Her blood pressure is currently 130/76 mmHg, managed with losartan 50 mg taken once daily, usually at night before work. She continues to monitor her blood pressure regularly.  Her eczema medication is no longer effective, as her body seems to have become accustomed to it. The eczema primarily affects her face, hands, and chest area, and she has been using the medication for a long time.  Her asthma is well-controlled, with no recent exacerbations. She missed a recent ENT appointment due to contracting norovirus, which has been circulating at her workplace.  She has a history of anxiety and depression, with recent scores indicating higher levels of these conditions. She started on Zoloft 25 mg, which has provided some relief. She is also interested in starting therapy. HPI   Past Medical History:  Diagnosis Date   Asthma    Diabetes mellitus without complication (HCC)    Eczema    Hypertension     Past Surgical History:  Procedure Laterality Date   CATARACT EXTRACTION, BILATERAL      Family History  Problem Relation Age of Onset   Eczema Mother    Diabetes Father    Psoriasis Father    Eczema Father    Sleep apnea Father    Asthma Sister    Eczema Sister    Eczema Sister     Social History    Socioeconomic History   Marital status: Single    Spouse name: Not on file   Number of children: Not on file   Years of education: Not on file   Highest education level: Not on file  Occupational History   Not on file  Tobacco Use   Smoking status: Former    Current packs/day: 0.00    Types: Cigarettes    Quit date: 04/16/2019    Years since quitting: 4.0   Smokeless tobacco: Never  Vaping Use   Vaping status: Never Used  Substance and Sexual Activity   Alcohol use: Yes    Alcohol/week: 4.0 standard drinks of alcohol    Types: 4 Shots of liquor per week    Comment: socially   Drug use: Not Currently    Types: Marijuana   Sexual activity: Not Currently  Other Topics Concern   Not on file  Social History Narrative   Not on file   Social Drivers of Health   Financial Resource Strain: Not on file  Food Insecurity: Not on file  Transportation Needs: Not on file  Physical Activity: Inactive (10/30/2018)   Exercise Vital Sign    Days of Exercise per Week: 0 days    Minutes of Exercise per Session: 0 min  Stress: No Stress Concern Present (10/30/2018)   [employer] of Occupational Health - Occupational Stress Questionnaire    Feeling of Stress : Only a little  Social  Connections: Socially Isolated (10/29/2018)   Social Connection and Isolation Panel [NHANES]    Frequency of Communication with Friends and Family: More than three times a week    Frequency of Social Gatherings with Friends and Family: More than three times a week    Attends Religious Services: Never    Database administrator or Organizations: No    Attends Banker Meetings: Never    Marital Status: Never married  Intimate Partner Violence: Not At Risk (10/29/2018)   Humiliation, Afraid, Rape, and Kick questionnaire    Fear of Current or Ex-Partner: No    Emotionally Abused: No    Physically Abused: No    Sexually Abused: No     Outpatient Medications Prior to Visit  Medication Sig  Dispense Refill   albuterol (VENTOLIN HFA) 108 (90 Base) MCG/ACT inhaler INHALE 2 PUFFS BY MOUTH EVERY 4 HOURS AS NEEDED FOR SHORTNESS OF BREATH OR WHEEZING 9 g 0   blood glucose meter kit and supplies KIT Dispense based on patient and insurance preference. Use up to four times daily as directed. 1 each 0   blood glucose meter kit and supplies Dispense based on patient and insurance preference. Use up to four times daily as directed. (FOR ICD-10 E10.9, E11.9). 1 each 0   Continuous Glucose Sensor (DEXCOM G7 SENSOR) MISC Change the sensor every 10 days. 4 each 3   Continuous Glucose Sensor (FREESTYLE LIBRE 2 SENSOR) MISC Change the sensor every 14 days 2 each 1   DM-APAP-CPM (CORICIDIN HBP PO) Take 2 tablets by mouth every other day as needed. For cold, flu and chest congestion.     EPINEPHrine 0.3 mg/0.3 mL IJ SOAJ injection Inject 0.3 mg into the muscle as needed for anaphylaxis. 1 each 2   Fiber Adult Gummies 2 g CHEW Chew 4 tablets by mouth daily.     ipratropium (ATROVENT) 0.02 % nebulizer solution Take 2.5 mLs (0.5 mg total) by nebulization every 4 (four) hours as needed for wheezing or shortness of breath. 75 mL 12   montelukast (SINGULAIR) 10 MG tablet Take 10 mg by mouth daily.     mupirocin ointment (BACTROBAN) 2 % Apply 1 Application topically 2 (two) times daily. 22 g 0   albuterol (PROVENTIL) (2.5 MG/3ML) 0.083% nebulizer solution Take 3 mLs (2.5 mg total) by nebulization every 6 (six) hours as needed for wheezing or shortness of breath. 360 mL 0   halobetasol (ULTRAVATE) 0.05 % cream APPLY CREAM TOPICALLY TWICE DAILY AFTER SHOWERS 45 g 0   losartan-hydrochlorothiazide (HYZAAR) 50-12.5 MG tablet Take 1 tablet by mouth daily. 90 tablet 1   mometasone-formoterol (DULERA) 100-5 MCG/ACT AERO Inhale 2 puffs into the lungs 2 (two) times daily. 39 g 0   Semaglutide, 1 MG/DOSE, 4 MG/3ML SOPN Inject 1 mg as directed once a week. 3 mL 2   sertraline (ZOLOFT) 25 MG tablet Take 1 tablet (25 mg  total) by mouth daily. 30 tablet 1   No facility-administered medications prior to visit.    Allergies  Allergen Reactions   Bee Venom Anaphylaxis   Peanut Oil Anaphylaxis   Shellfish Allergy Hives   Metformin And Related Nausea And Vomiting    ROS Review of Systems Negative unless indicated in HPI.    Objective:    Physical Exam Constitutional:      Appearance: Normal appearance.  HENT:     Mouth/Throat:     Mouth: Mucous membranes are moist.  Eyes:     Conjunctiva/sclera:  Conjunctivae normal.     Pupils: Pupils are equal, round, and reactive to light.  Cardiovascular:     Rate and Rhythm: Normal rate and regular rhythm.     Pulses: Normal pulses.     Heart sounds: Normal heart sounds.  Pulmonary:     Effort: Pulmonary effort is normal.     Breath sounds: Normal breath sounds.  Abdominal:     General: Bowel sounds are normal.     Palpations: Abdomen is soft.  Musculoskeletal:     Cervical back: Normal range of motion. No tenderness.  Skin:    General: Skin is warm.     Findings: No bruising.  Neurological:     General: No focal deficit present.     Mental Status: She is alert and oriented to person, place, and time. Mental status is at baseline.  Psychiatric:        Mood and Affect: Mood normal.        Behavior: Behavior normal.        Thought Content: Thought content normal.        Judgment: Judgment normal.     BP 130/76   Pulse 72   Temp 97.9 F (36.6 C) (Oral)   Ht 5' 3.5" (1.613 m)   Wt (!) 324 lb 3.2 oz (147.1 kg)   LMP  (LMP Unknown)   SpO2 94%   BMI 56.53 kg/m  Wt Readings from Last 3 Encounters:  04/07/23 (!) 324 lb 3.2 oz (147.1 kg)  02/07/23 (!) 339 lb 9.6 oz (154 kg)  01/10/23 (!) 347 lb (157.4 kg)     Health Maintenance  Topic Date Due   Hepatitis C Screening  Never done   FOOT EXAM  10/22/2019   OPHTHALMOLOGY EXAM  07/06/2022   COVID-19 Vaccine (1 - 2024-25 season) 04/23/2023 (Originally 09/25/2022)   INFLUENZA VACCINE   04/24/2023 (Originally 08/25/2022)   Pneumococcal Vaccine 78-44 Years old (2 of 2 - PCV) 04/06/2024 (Originally 10/22/2019)   HEMOGLOBIN A1C  10/06/2023   Cervical Cancer Screening (HPV/Pap Cotest)  11/19/2023   Diabetic kidney evaluation - Urine ACR  01/03/2024   Diabetic kidney evaluation - eGFR measurement  04/04/2024   DTaP/Tdap/Td (3 - Td or Tdap) 11/17/2032   HIV Screening  Completed   HPV VACCINES  Aged Out    There are no preventive care reminders to display for this patient.  Lab Results  Component Value Date   TSH 3.66 03/26/2021   Lab Results  Component Value Date   WBC 10.2 04/05/2023   HGB 13.0 04/05/2023   HCT 40.9 04/05/2023   MCV 78.9 04/05/2023   PLT 398.0 04/05/2023   Lab Results  Component Value Date   NA 136 04/05/2023   K 3.9 04/05/2023   CO2 32 04/05/2023   GLUCOSE 103 (H) 04/05/2023   BUN 10 04/05/2023   CREATININE 0.84 04/05/2023   BILITOT 0.4 04/05/2023   ALKPHOS 83 04/05/2023   AST 23 04/05/2023   ALT 28 04/05/2023   PROT 8.1 04/05/2023   ALBUMIN 4.3 04/05/2023   CALCIUM 9.6 04/05/2023   ANIONGAP 8 12/20/2022   GFR 90.36 04/05/2023   Lab Results  Component Value Date   CHOL 176 04/05/2023   Lab Results  Component Value Date   HDL 40.50 04/05/2023   Lab Results  Component Value Date   LDLCALC 113 (H) 04/05/2023   Lab Results  Component Value Date   TRIG 112.0 04/05/2023   Lab Results  Component Value Date  CHOLHDL 4 04/05/2023   Lab Results  Component Value Date   HGBA1C 8.4 (H) 04/05/2023      Assessment & Plan:  Keratoconus, bilateral Assessment & Plan: No eye examination in over a year. Prefers referral to specialist. Previous surgery discussions not followed up. - Refer to a keratoconus specialist for evaluation and management. - Ensure diabetes-related eye examination is conducted.  Orders: -     Ambulatory referral to Ophthalmology  Asthma, unspecified asthma severity, unspecified whether complicated,  unspecified whether persistent Assessment & Plan: Asthma well-controlled with Dulera. - Refill Dulera prescription.   Anxiety and depression Assessment & Plan: Elevated anxiety and depression scores. Zoloft 25 mg provides some relief. Interested in therapy and dosage increase. - Increase Zoloft to 50 mg daily. - Refer to a therapist for counseling. - Send referral for in-person psychiatric evaluation if needed.   Orders: -     Ambulatory referral to Psychiatry  Type 2 diabetes mellitus with hyperglycemia, without long-term current use of insulin (HCC) Assessment & Plan: Hemoglobin A1c improved to 8.4%, indicating better glycemic control. Ozempic effective. Weight loss beneficial. - Continue Ozempic. - Monitor blood glucose levels.  Orders: -     Ambulatory referral to Ophthalmology  Essential hypertension Assessment & Plan: Blood pressure well-controlled with losartan 50 mg. - Continue losartan 50 mg daily. - Monitor blood pressure regularly.   Other eczema Assessment & Plan: Eczema not well-controlled, affecting face, hands, and chest. Current treatment tolerated. - Change eczema medication from halobetasol.    Other orders -     Albuterol Sulfate; Take 3 mLs (2.5 mg total) by nebulization every 6 (six) hours as needed for wheezing or shortness of breath.  Dispense: 360 mL; Refill: 0 -     Dulera; Inhale 2 puffs into the lungs 2 (two) times daily.  Dispense: 39 g; Refill: 0 -     Semaglutide (1 MG/DOSE); Inject 1 mg as directed once a week.  Dispense: 3 mL; Refill: 2 -     Losartan Potassium-HCTZ; Take 1 tablet by mouth daily.  Dispense: 90 tablet; Refill: 1 -     Sertraline HCl; Take 1 tablet (50 mg total) by mouth daily.  Dispense: 30 tablet; Refill: 3 -     Triamcinolone Acetonide; Apply 1 Application topically 2 (two) times daily.  Dispense: 30 g; Refill: 2    Follow-up: Return in about 13 weeks (around 07/07/2023).   Kara Dies, NP

## 2023-04-19 DIAGNOSIS — L309 Dermatitis, unspecified: Secondary | ICD-10-CM | POA: Insufficient documentation

## 2023-04-19 DIAGNOSIS — H18603 Keratoconus, unspecified, bilateral: Secondary | ICD-10-CM | POA: Insufficient documentation

## 2023-04-19 DIAGNOSIS — F419 Anxiety disorder, unspecified: Secondary | ICD-10-CM | POA: Insufficient documentation

## 2023-04-19 NOTE — Assessment & Plan Note (Signed)
 Blood pressure well-controlled with losartan 50 mg. - Continue losartan 50 mg daily. - Monitor blood pressure regularly.

## 2023-04-19 NOTE — Assessment & Plan Note (Signed)
 Elevated anxiety and depression scores. Zoloft 25 mg provides some relief. Interested in therapy and dosage increase. - Increase Zoloft to 50 mg daily. - Refer to a therapist for counseling. - Send referral for in-person psychiatric evaluation if needed.

## 2023-04-19 NOTE — Assessment & Plan Note (Signed)
 Asthma well-controlled with Dulera. - Refill Dulera prescription.

## 2023-04-19 NOTE — Assessment & Plan Note (Signed)
 No eye examination in over a year. Prefers referral to specialist. Previous surgery discussions not followed up. - Refer to a keratoconus specialist for evaluation and management. - Ensure diabetes-related eye examination is conducted.

## 2023-04-19 NOTE — Assessment & Plan Note (Signed)
 Hemoglobin A1c improved to 8.4%, indicating better glycemic control. Ozempic effective. Weight loss beneficial. - Continue Ozempic. - Monitor blood glucose levels.

## 2023-04-19 NOTE — Assessment & Plan Note (Signed)
 Eczema not well-controlled, affecting face, hands, and chest. Current treatment tolerated. - Change eczema medication from halobetasol.

## 2023-05-06 ENCOUNTER — Other Ambulatory Visit: Payer: Self-pay | Admitting: Nurse Practitioner

## 2023-07-07 ENCOUNTER — Ambulatory Visit: Admitting: Nurse Practitioner

## 2023-07-10 ENCOUNTER — Other Ambulatory Visit: Payer: Self-pay | Admitting: Nurse Practitioner

## 2023-07-10 DIAGNOSIS — J4541 Moderate persistent asthma with (acute) exacerbation: Secondary | ICD-10-CM

## 2023-07-13 ENCOUNTER — Ambulatory Visit: Admitting: Nurse Practitioner

## 2023-07-31 ENCOUNTER — Ambulatory Visit: Admitting: Internal Medicine

## 2024-03-01 ENCOUNTER — Emergency Department: Payer: Self-pay

## 2024-03-01 ENCOUNTER — Emergency Department
Admission: EM | Admit: 2024-03-01 | Discharge: 2024-03-01 | Disposition: A | Discharge status: Home / Self Care | Payer: Self-pay | Source: Home / Self Care | Attending: Emergency Medicine | Admitting: Emergency Medicine

## 2024-03-01 ENCOUNTER — Other Ambulatory Visit: Payer: Self-pay

## 2024-03-01 DIAGNOSIS — J45901 Unspecified asthma with (acute) exacerbation: Secondary | ICD-10-CM

## 2024-03-01 MED ORDER — IPRATROPIUM-ALBUTEROL 0.5-2.5 (3) MG/3ML IN SOLN
3.0000 mL | Freq: Once | RESPIRATORY_TRACT | Status: AC
  Administered 2024-03-01: 3 mL via RESPIRATORY_TRACT
  Filled 2024-03-01: qty 3

## 2024-03-01 MED ORDER — PREDNISONE 20 MG PO TABS
60.0000 mg | ORAL_TABLET | Freq: Once | ORAL | Status: AC
  Administered 2024-03-01: 60 mg via ORAL
  Filled 2024-03-01: qty 3

## 2024-03-01 MED ORDER — PREDNISONE 10 MG PO TABS: 50.0000 mg | ORAL_TABLET | Freq: Every day | ORAL | 0 refills | Status: AC

## 2024-03-01 NOTE — Discharge Instructions (Signed)
 Continue using your inhalers as prescribed.  Take the prednisone  for the next 5 evenings.  See primary care if not improving over the next 24 to 48 hours.  If your symptoms change or worsen and you are unable to schedule an appointment, please return to the emergency department.

## 2024-03-01 NOTE — ED Triage Notes (Signed)
 Pt presents to the ED POV due to an increase in SOB due to her asthma over the course of 2 weeks. Endorses a yellow tinged productive cough. Denies a fever, body aches. Has a inhaler at home which has not helped. Used the inhaler PTA. Speaking inn complete sentences in triage

## 2024-03-01 NOTE — ED Provider Triage Note (Signed)
 Emergency Medicine Provider Triage Evaluation Note  Kelsey Mcclure , a 34 y.o. female  was evaluated in triage.  Pt complains of shortness of breath and worsening asthma x 2 weeks. No relief with albuterol . No known fever.  Physical Exam  BP (!) 148/106 (BP Location: Right Arm)   Pulse 75   Temp 98.2 F (36.8 C) (Oral)   Resp 20   Ht 5' 3 (1.6 m)   Wt 135.2 kg   SpO2 93%   BMI 52.79 kg/m  Gen:   Awake, no distress   Resp:  Normal effort. Diffuse wheezing  MSK:   Moves extremities without difficulty  Other:    Medical Decision Making  Medically screening exam initiated at 6:25 PM.  Appropriate orders placed.  Kelsey Mcclure was informed that the remainder of the evaluation will be completed by another provider, this initial triage assessment does not replace that evaluation, and the importance of remaining in the ED until their evaluation is complete.  Duoneb and chest x-ray ordered.   Kelsey Kirk NOVAK, FNP 03/01/24 1827

## 2024-03-01 NOTE — ED Provider Notes (Signed)
 "  [facility] Provider Note    Event Date/Time   First MD Initiated Contact with Patient 03/01/24 2010     (approximate)   History   Shortness of Breath   HPI  Shanley Gonzalo is a 34 y.o. female with history of hypertension, GERD, type 2 diabetes, asthma, hyperlipidemia and as listed in EMR presents to the emergency department for treatment and evaluation of increasing shortness of breath secondary to asthma over the past 2 weeks.  She is also had a productive cough of yellow sputum.  No fever.  No relief with her albuterol  and Dulera  inhaler..     Physical Exam    Vitals:   03/01/24 1826 03/01/24 1828  BP:  (!) 160/108  Pulse: 75   Resp: 20   Temp: 98.4 F (36.9 C)   SpO2: 94%     General: Awake, no distress.  CV:  Good peripheral perfusion.  Resp:  Normal effort.  Diffuse expiratory wheezes throughout.  No rhonchi Abd:  No distention.  Other:     ED Results / Procedures / Treatments   Labs (all labs ordered are listed, but only abnormal results are displayed)  Labs Reviewed - No data to display   EKG  Not indicated   RADIOLOGY  Image and radiology report reviewed and interpreted by me. Radiology report consistent with the same.  Chest x-ray negative for acute cardiopulmonary abnormality, specifically no infiltrate  PROCEDURES:  Critical Care performed: No  Procedures   MEDICATIONS ORDERED IN ED:  Medications  ipratropium-albuterol  (DUONEB) 0.5-2.5 (3) MG/3ML nebulizer solution 3 mL (has no administration in time range)  predniSONE  (DELTASONE ) tablet 60 mg (has no administration in time range)  ipratropium-albuterol  (DUONEB) 0.5-2.5 (3) MG/3ML nebulizer solution 3 mL (3 mLs Nebulization Given 03/01/24 1831)     IMPRESSION / MDM / ASSESSMENT AND PLAN / ED COURSE   I have reviewed the triage note and vital signs. Vital signs indicate hypertension otherwise normal   Differential diagnosis includes, but is not  limited to, asthma exacerbation, pneumonia, CHF  Patient's presentation is most consistent with acute illness / injury with system symptoms.  34 year old female presenting to the emergency department for treatment and evaluation of progressively worsening asthma over the past 2 weeks with productive cough.  See HPI for further details.  On exam in triage, she had diffuse expiratory wheezes with decreased air movement.  DuoNeb was given.  She has felt better since then.  Upon reevaluation, air exchange has improved however she still has expiratory wheezing.  Plan will be to give her a second DuoNeb and dose of prednisone  then discharged home on burst dose of prednisone .  She states that she has albuterol  and Dulera  inhalers at home and will continue using those as prescribed.  She was encouraged to follow-up with her primary care provider if not improving over the next few days.  If symptoms change or worsen and she is unable to schedule appointment, she is to return back to the emergency department.      FINAL CLINICAL IMPRESSION(S) / ED DIAGNOSES   Final diagnoses:  Exacerbation of asthma, unspecified asthma severity, unspecified whether persistent     Rx / DC Orders   ED Discharge Orders          Ordered    predniSONE  (DELTASONE ) 10 MG tablet  Daily        03/01/24 2042             Note:  This  document was prepared using Conservation officer, historic buildings and may include unintentional dictation errors.   Herlinda Kirk NOVAK, FNP 03/01/24 2043    Dorothyann Drivers, MD 03/01/24 2307  "
